# Patient Record
Sex: Male | Born: 1955
Health system: Southern US, Community
[De-identification: ages and names within clinical notes are randomized; demographics above are authoritative.]

## PROBLEM LIST (undated history)

## (undated) DIAGNOSIS — I255 Ischemic cardiomyopathy: Secondary | ICD-10-CM

## (undated) DIAGNOSIS — B20 Human immunodeficiency virus [HIV] disease: Secondary | ICD-10-CM

## (undated) DIAGNOSIS — I2510 Atherosclerotic heart disease of native coronary artery without angina pectoris: Secondary | ICD-10-CM

## (undated) DIAGNOSIS — F1910 Other psychoactive substance abuse, uncomplicated: Secondary | ICD-10-CM

## (undated) DIAGNOSIS — I472 Ventricular tachycardia: Secondary | ICD-10-CM

## (undated) DIAGNOSIS — E785 Hyperlipidemia, unspecified: Secondary | ICD-10-CM

## (undated) DIAGNOSIS — R000 Tachycardia, unspecified: Secondary | ICD-10-CM

## (undated) DIAGNOSIS — Z91199 Patient's noncompliance with other medical treatment and regimen due to unspecified reason: Secondary | ICD-10-CM

## (undated) DIAGNOSIS — Z9119 Patient's noncompliance with other medical treatment and regimen: Secondary | ICD-10-CM

## (undated) DIAGNOSIS — Z720 Tobacco use: Secondary | ICD-10-CM

## (undated) DIAGNOSIS — E079 Disorder of thyroid, unspecified: Secondary | ICD-10-CM

## (undated) DIAGNOSIS — I213 ST elevation (STEMI) myocardial infarction of unspecified site: Secondary | ICD-10-CM

## (undated) HISTORY — PX: CORONARY ANGIOPLASTY WITH STENT PLACEMENT: SHX49

## (undated) HISTORY — DX: Other psychoactive substance abuse, uncomplicated: F19.10

## (undated) HISTORY — DX: Tachycardia, unspecified: R00.0

## (undated) HISTORY — DX: Ischemic cardiomyopathy: I25.5

## (undated) HISTORY — DX: Ventricular tachycardia: I47.2

---

## 2017-09-05 ENCOUNTER — Emergency Department: Payer: No Typology Code available for payment source

## 2017-09-05 ENCOUNTER — Emergency Department
Admission: EM | Admit: 2017-09-05 | Discharge: 2017-09-06 | Disposition: A | Discharge status: Other Acute Inpatient Hospital | Payer: No Typology Code available for payment source | Attending: Emergency Medicine | Admitting: Emergency Medicine

## 2017-09-05 DIAGNOSIS — Y92410 Unspecified street and highway as the place of occurrence of the external cause: Secondary | ICD-10-CM | POA: Diagnosis not present

## 2017-09-05 DIAGNOSIS — F17200 Nicotine dependence, unspecified, uncomplicated: Secondary | ICD-10-CM | POA: Insufficient documentation

## 2017-09-05 DIAGNOSIS — S42112A Displaced fracture of body of scapula, left shoulder, initial encounter for closed fracture: Secondary | ICD-10-CM | POA: Insufficient documentation

## 2017-09-05 DIAGNOSIS — Y998 Other external cause status: Secondary | ICD-10-CM | POA: Diagnosis not present

## 2017-09-05 DIAGNOSIS — R0902 Hypoxemia: Secondary | ICD-10-CM | POA: Diagnosis not present

## 2017-09-05 DIAGNOSIS — I2510 Atherosclerotic heart disease of native coronary artery without angina pectoris: Secondary | ICD-10-CM | POA: Diagnosis not present

## 2017-09-05 DIAGNOSIS — Y939 Activity, unspecified: Secondary | ICD-10-CM | POA: Insufficient documentation

## 2017-09-05 DIAGNOSIS — S4992XA Unspecified injury of left shoulder and upper arm, initial encounter: Secondary | ICD-10-CM | POA: Diagnosis present

## 2017-09-05 DIAGNOSIS — S270XXA Traumatic pneumothorax, initial encounter: Secondary | ICD-10-CM | POA: Diagnosis not present

## 2017-09-05 DIAGNOSIS — R0602 Shortness of breath: Secondary | ICD-10-CM | POA: Insufficient documentation

## 2017-09-05 DIAGNOSIS — S27321A Contusion of lung, unilateral, initial encounter: Secondary | ICD-10-CM | POA: Diagnosis not present

## 2017-09-05 DIAGNOSIS — B20 Human immunodeficiency virus [HIV] disease: Secondary | ICD-10-CM | POA: Insufficient documentation

## 2017-09-05 DIAGNOSIS — S2242XA Multiple fractures of ribs, left side, initial encounter for closed fracture: Secondary | ICD-10-CM | POA: Diagnosis not present

## 2017-09-05 HISTORY — DX: Human immunodeficiency virus (HIV) disease: B20

## 2017-09-05 HISTORY — DX: Disorder of thyroid, unspecified: E07.9

## 2017-09-05 HISTORY — DX: Atherosclerotic heart disease of native coronary artery without angina pectoris: I25.10

## 2017-09-05 MED ORDER — ONDANSETRON HCL 4 MG/2ML IJ SOLN
4.0000 mg | Freq: Once | INTRAMUSCULAR | Status: AC
  Administered 2017-09-06: 4 mg via INTRAVENOUS
  Filled 2017-09-05: qty 2

## 2017-09-05 MED ORDER — MORPHINE SULFATE (PF) 4 MG/ML IV SOLN
4.0000 mg | Freq: Once | INTRAVENOUS | Status: AC
  Administered 2017-09-06: 4 mg via INTRAVENOUS
  Filled 2017-09-05: qty 1

## 2017-09-05 MED ORDER — SODIUM CHLORIDE 0.9 % IV BOLUS
1000.0000 mL | Freq: Once | INTRAVENOUS | Status: AC
  Administered 2017-09-06: 1000 mL via INTRAVENOUS
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
## 2017-09-06 ENCOUNTER — Encounter: Payer: Self-pay | Admitting: Radiology

## 2017-09-06 ENCOUNTER — Emergency Department: Payer: No Typology Code available for payment source

## 2017-09-06 LAB — COMPREHENSIVE METABOLIC PANEL
ALBUMIN: 4.3 g/dL (ref 3.5–5.0)
ALT: 64 U/L — ABNORMAL HIGH (ref 17–63)
AST: 75 U/L — AB (ref 15–41)
Alkaline Phosphatase: 72 U/L (ref 38–126)
Anion gap: 11 (ref 5–15)
BUN: 17 mg/dL (ref 6–20)
CHLORIDE: 107 mmol/L (ref 101–111)
CO2: 19 mmol/L — ABNORMAL LOW (ref 22–32)
Calcium: 8.8 mg/dL — ABNORMAL LOW (ref 8.9–10.3)
Creatinine, Ser: 1.13 mg/dL (ref 0.61–1.24)
GFR calc Af Amer: >60 mL/min (ref >60)
Glucose, Bld: 113 mg/dL — ABNORMAL HIGH (ref 65–99)
POTASSIUM: 4 mmol/L (ref 3.5–5.1)
SODIUM: 137 mmol/L (ref 135–145)
Total Bilirubin: 0.6 mg/dL (ref 0.3–1.2)
Total Protein: 7.9 g/dL (ref 6.5–8.1)

## 2017-09-06 LAB — CBC
HCT: 43.3 % (ref 40.0–52.0)
Hemoglobin: 14.9 g/dL (ref 13.0–18.0)
MCH: 33.3 pg (ref 26.0–34.0)
MCHC: 34.4 g/dL (ref 32.0–36.0)
MCV: 96.8 fL (ref 80.0–100.0)
Platelets: 349 10*3/uL (ref 150–440)
RBC: 4.47 MIL/uL (ref 4.40–5.90)
RDW: 13.8 % (ref 11.5–14.5)
WBC: 15.2 10*3/uL — AB (ref 3.8–10.6)

## 2017-09-06 LAB — ETHANOL: ALCOHOL ETHYL (B): 81 mg/dL — AB (ref <10)

## 2017-09-06 MED ORDER — HYDROMORPHONE HCL 1 MG/ML IJ SOLN
1.0000 mg | Freq: Once | INTRAMUSCULAR | Status: AC
  Administered 2017-09-06: 1 mg via INTRAVENOUS
  Filled 2017-09-06: qty 1

## 2017-09-06 MED ORDER — IOPAMIDOL (ISOVUE-300) INJECTION 61%
100.0000 mL | Freq: Once | INTRAVENOUS | Status: AC | PRN
  Administered 2017-09-06: 100 mL via INTRAVENOUS

## 2017-09-06 NOTE — ED Notes (Signed)
EMTALA reviewed prior to pt transport. Appears complete at this time.

## 2017-09-06 NOTE — ED Notes (Signed)
Pt 84% on room air after giving Dilaudid.  Pt placed on 4L nasal cannula to maintain saturation >90%.  EDP aware.
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
## 2017-09-06 NOTE — ED Notes (Signed)
Patient transported to CT 

## 2017-09-06 NOTE — ED Notes (Signed)
Pt provided cup of water per request, ok provided per EDP.

## 2017-09-13 ENCOUNTER — Emergency Department
Admission: EM | Admit: 2017-09-13 | Discharge: 2017-09-13 | Disposition: A | Discharge status: Home / Self Care | Payer: No Typology Code available for payment source | Attending: Emergency Medicine | Admitting: Emergency Medicine

## 2017-09-13 ENCOUNTER — Other Ambulatory Visit: Payer: Self-pay

## 2017-09-13 DIAGNOSIS — B20 Human immunodeficiency virus [HIV] disease: Secondary | ICD-10-CM | POA: Insufficient documentation

## 2017-09-13 DIAGNOSIS — S42102D Fracture of unspecified part of scapula, left shoulder, subsequent encounter for fracture with routine healing: Secondary | ICD-10-CM | POA: Diagnosis not present

## 2017-09-13 DIAGNOSIS — Z79899 Other long term (current) drug therapy: Secondary | ICD-10-CM | POA: Diagnosis not present

## 2017-09-13 DIAGNOSIS — S2242XD Multiple fractures of ribs, left side, subsequent encounter for fracture with routine healing: Secondary | ICD-10-CM | POA: Diagnosis not present

## 2017-09-13 DIAGNOSIS — Z760 Encounter for issue of repeat prescription: Secondary | ICD-10-CM | POA: Insufficient documentation

## 2017-09-13 DIAGNOSIS — F17210 Nicotine dependence, cigarettes, uncomplicated: Secondary | ICD-10-CM | POA: Insufficient documentation

## 2017-09-13 DIAGNOSIS — S2242XA Multiple fractures of ribs, left side, initial encounter for closed fracture: Secondary | ICD-10-CM

## 2017-09-13 DIAGNOSIS — I2510 Atherosclerotic heart disease of native coronary artery without angina pectoris: Secondary | ICD-10-CM | POA: Diagnosis not present

## 2017-09-13 MED ORDER — OXYCODONE HCL 5 MG PO TABS: 5.0000 mg | ORAL_TABLET | Freq: Four times a day (QID) | ORAL | 0 refills | Status: DC | PRN

## 2017-09-13 MED ORDER — GABAPENTIN 300 MG PO CAPS
300.0000 mg | ORAL_CAPSULE | Freq: Once | ORAL | Status: AC
  Administered 2017-09-13: 300 mg via ORAL
  Filled 2017-09-13 (×2): qty 1

## 2017-09-13 MED ORDER — GABAPENTIN 300 MG PO CAPS: 300.0000 mg | ORAL_CAPSULE | Freq: Three times a day (TID) | ORAL | 0 refills | Status: DC

## 2017-09-13 MED ORDER — OXYCODONE HCL 5 MG PO TABS
10.0000 mg | ORAL_TABLET | Freq: Once | ORAL | Status: AC
Start: 2017-09-13 — End: 2017-09-13
  Administered 2017-09-13: 10 mg via ORAL
  Filled 2017-09-13: qty 2

## 2017-09-13 MED ORDER — MELOXICAM 15 MG PO TABS: 15.0000 mg | ORAL_TABLET | Freq: Every day | ORAL | 0 refills | Status: DC
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
## 2018-03-18 ENCOUNTER — Emergency Department
Admission: EM | Admit: 2018-03-18 | Discharge: 2018-03-18 | Disposition: A | Discharge status: Home / Self Care | Payer: Self-pay | Attending: Emergency Medicine | Admitting: Emergency Medicine

## 2018-03-18 ENCOUNTER — Other Ambulatory Visit: Payer: Self-pay

## 2018-03-18 DIAGNOSIS — I259 Chronic ischemic heart disease, unspecified: Secondary | ICD-10-CM | POA: Insufficient documentation

## 2018-03-18 DIAGNOSIS — B20 Human immunodeficiency virus [HIV] disease: Secondary | ICD-10-CM | POA: Insufficient documentation

## 2018-03-18 DIAGNOSIS — F17210 Nicotine dependence, cigarettes, uncomplicated: Secondary | ICD-10-CM | POA: Insufficient documentation

## 2018-03-18 DIAGNOSIS — Z79899 Other long term (current) drug therapy: Secondary | ICD-10-CM | POA: Insufficient documentation

## 2018-03-18 DIAGNOSIS — N3000 Acute cystitis without hematuria: Secondary | ICD-10-CM | POA: Insufficient documentation

## 2018-03-18 LAB — CBC
HCT: 43.4 % (ref 39.0–52.0)
HEMOGLOBIN: 14.6 g/dL (ref 13.0–17.0)
MCH: 32.4 pg (ref 26.0–34.0)
MCHC: 33.6 g/dL (ref 30.0–36.0)
MCV: 96.2 fL (ref 80.0–100.0)
Platelets: 362 10*3/uL (ref 150–400)
RBC: 4.51 MIL/uL (ref 4.22–5.81)
RDW: 13.1 % (ref 11.5–15.5)
WBC: 8.2 10*3/uL (ref 4.0–10.5)
nRBC: 0 % (ref 0.0–0.2)

## 2018-03-18 LAB — URINALYSIS, COMPLETE (UACMP) WITH MICROSCOPIC
Bilirubin Urine: NEGATIVE
GLUCOSE, UA: NEGATIVE mg/dL
Hgb urine dipstick: NEGATIVE
Ketones, ur: NEGATIVE mg/dL
NITRITE: POSITIVE — AB
Protein, ur: NEGATIVE mg/dL
SPECIFIC GRAVITY, URINE: 1.019 (ref 1.005–1.030)
Squamous Epithelial / LPF: NONE SEEN (ref 0–5)
WBC, UA: >50 WBC/hpf — ABNORMAL HIGH (ref 0–5)
pH: 6 (ref 5.0–8.0)

## 2018-03-18 LAB — BASIC METABOLIC PANEL
ANION GAP: 8 (ref 5–15)
BUN: 21 mg/dL — ABNORMAL HIGH (ref 6–20)
CO2: 25 mmol/L (ref 22–32)
Calcium: 9.1 mg/dL (ref 8.9–10.3)
Chloride: 102 mmol/L (ref 98–111)
Creatinine, Ser: 0.79 mg/dL (ref 0.61–1.24)
GFR calc Af Amer: >60 mL/min (ref >60)
Glucose, Bld: 95 mg/dL (ref 70–99)
POTASSIUM: 3.9 mmol/L (ref 3.5–5.1)
SODIUM: 135 mmol/L (ref 135–145)

## 2018-03-18 MED ORDER — CEPHALEXIN 500 MG PO CAPS: 500.0000 mg | ORAL_CAPSULE | Freq: Two times a day (BID) | ORAL | 0 refills | Status: DC

## 2018-03-18 NOTE — ED Triage Notes (Signed)
Pt states hx of UTI. States urinary urgency, frequency, foul smelling urine, pain in kidneys. Denies fever. Symptoms x 3 weeks. Has increased amount of drinking to "flush kidneys out but it's not working."   A&O, ambulatory.,

## 2018-03-18 NOTE — ED Notes (Signed)
Dr. Scotty Court at bedside. Patient states he was going to leave without seeing the doctor because he has family obligations.

## 2018-03-18 NOTE — ED Notes (Signed)
Patient is in a hallway bed. NAD at this time.

## 2018-03-18 NOTE — ED Provider Notes (Signed)
[facility] Emergency Department Provider Note  ____________________________________________  Time seen: Approximately 3:44 PM  I have reviewed the triage vital signs and the nursing notes.   HISTORY  Chief Complaint Urinary Tract Infection    HPI Douglas Conway is a 62 y.o. male with a history of CAD status post stent and HIV with undetectable viral load on antiretrovirals for 30 years who complains of dysuria frequency and urgency for the past 4 weeks, persistent.  Not specifically worsening.  Denies fever chills or sweats.  Pain does radiate in the his low back.  No vomiting or nausea or dizziness.  No aggravating or alleviating factors.  Feels like a UTI he has had in the past.      Past Medical History:  Diagnosis Date  . Coronary artery disease   . HIV (human immunodeficiency virus infection) (HCC)   . Thyroid disease      There are no active problems to display for this patient.    History reviewed. No pertinent surgical history.   Prior to Admission medications   Medication Sig Start Date End Date Taking? Authorizing Provider  acyclovir (ZOVIRAX) 200 MG capsule Take 200 mg by mouth 2 (two) times daily.   Yes [provider]  bictegravir-emtricitabine-tenofovir AF (BIKTARVY) 50-200-25 MG TABS tablet Take 1 tablet by mouth daily.   Yes [provider]  carvedilol (COREG) 6.25 MG tablet Take 6.25 mg by mouth 2 (two) times daily. 01/31/14  Yes [provider]  digoxin (LANOXIN) 0.125 MG tablet Take 125 mcg by mouth daily.   Yes [provider]  levothyroxine (SYNTHROID, LEVOTHROID) 150 MCG tablet Take 150 mcg by mouth daily.   Yes [provider]  lisinopril (PRINIVIL,ZESTRIL) 10 MG tablet Take 10 mg by mouth daily. 01/31/14  Yes [provider]  pravastatin (PRAVACHOL) 40 MG tablet Take 40 mg by mouth daily. 01/31/14  Yes [provider]  ranitidine (ZANTAC) 150 MG tablet Take 150 mg by  mouth daily.   Yes [provider]  cephALEXin (KEFLEX) 500 MG capsule Take 1 capsule (500 mg total) by mouth 2 (two) times daily. 03/18/18   Sharman Cheek, MD     Allergies Plavix [clopidogrel bisulfate]   History reviewed. No pertinent family history.  Social History Social History   Tobacco Use  . Smoking status: Current Every Day Smoker  . Smokeless tobacco: Never Used  Substance Use Topics  . Alcohol use: Yes  . Drug use: Not on file    Review of Systems  Constitutional:   No fever or chills.  ENT:   No sore throat. No rhinorrhea. Cardiovascular:   No chest pain or syncope. Respiratory:   No dyspnea or cough. Gastrointestinal:   Negative for abdominal pain, vomiting and diarrhea.  Musculoskeletal:   Negative for focal pain or swelling All other systems reviewed and are negative except as documented above in ROS and HPI.  ____________________________________________   PHYSICAL EXAM:  VITAL SIGNS: ED Triage Vitals  Enc Vitals Group     BP 03/18/18 1209 (!) 120/100     Pulse Rate 03/18/18 1209 73     Resp 03/18/18 1209 18     Temp 03/18/18 1209 97.7 F (36.5 C)     Temp Source 03/18/18 1209 Oral     SpO2 03/18/18 1209 96 %     Weight 03/18/18 1210 175 lb (79.4 kg)     Height 03/18/18 1210 6\' 1"  (1.854 m)     Head Circumference --  Peak Flow --      Pain Score 03/18/18 1210 7     Pain Loc --      Pain Edu? --      Excl. in GC? --     Vital signs reviewed, nursing assessments reviewed.   Constitutional:   Alert and oriented. Non-toxic appearance. Eyes:   Conjunctivae are normal. EOMI. PERRL. ENT      Head:   Normocephalic and atraumatic.      Nose:   No congestion/rhinnorhea.        Neck:   No meningismus. Full ROM. Hematological/Lymphatic/Immunilogical:   No cervical lymphadenopathy. Cardiovascular:   RRR. Symmetric bilateral radial and DP pulses.  No murmurs. Cap refill less than 2 seconds. Respiratory:   Normal respiratory effort  without tachypnea/retractions. Breath sounds are clear and equal bilaterally. No wheezes/rales/rhonchi. Gastrointestinal:   Soft with mild suprapubic tenderness. Non distended. There is no CVA tenderness.  No rebound, rigidity, or guarding. Musculoskeletal:   Normal range of motion in all extremities. No joint effusions.  No lower extremity tenderness.  No edema. Neurologic:   Normal speech and language.  Motor grossly intact. No acute focal neurologic deficits are appreciated.    ____________________________________________    LABS (pertinent positives/negatives) (all labs ordered are listed, but only abnormal results are displayed) Labs Reviewed  URINALYSIS, COMPLETE (UACMP) WITH MICROSCOPIC - Abnormal; Notable for the following components:      Result Value   Color, Urine YELLOW (*)    APPearance HAZY (*)    Nitrite POSITIVE (*)    Leukocytes, UA SMALL (*)    WBC, UA >50 (*)    Bacteria, UA MANY (*)    All other components within normal limits  BASIC METABOLIC PANEL - Abnormal; Notable for the following components:   BUN 21 (*)    All other components within normal limits  URINE CULTURE  CBC   ____________________________________________   EKG    ____________________________________________    RADIOLOGY  No results found.  ____________________________________________   PROCEDURES Procedures  ____________________________________________    CLINICAL IMPRESSION / ASSESSMENT AND PLAN / ED COURSE  Pertinent labs & imaging results that were available during my care of the patient were reviewed by me and considered in my medical decision making (see chart for details).    Patient presents with cystitis.  Clinically does not have pyelonephritis.  Not septic.  Labs do not show any evidence of immunosuppression.  Urine culture sent, follow-up with primary care in a week.  Patient needs to leave the ED immediately due to family obligations, but he has no history of  kidney stones or urinary retention or bladder outlet obstruction or large prostate to suggest a complicated course.      ____________________________________________   FINAL CLINICAL IMPRESSION(S) / ED DIAGNOSES    Final diagnoses:  Acute cystitis without hematuria     ED Discharge Orders         Ordered    cephALEXin (KEFLEX) 500 MG capsule  2 times daily     03/18/18 1543          Portions of this note were generated with dragon dictation software. Dictation errors may occur despite best attempts at proofreading.    Sharman Cheek, MD 03/18/18 [phone]

## 2018-03-18 NOTE — ED Notes (Signed)
Patient refused discharge vital signs. E-signature pad on WOW did not work and patient was ready to go.

## 2018-03-20 LAB — URINE CULTURE: Culture: >=100000 — AB

## 2018-06-13 ENCOUNTER — Other Ambulatory Visit (HOSPITAL_COMMUNITY)
Admission: RE | Admit: 2018-06-13 | Discharge: 2018-06-13 | Disposition: A | Discharge status: Home / Self Care | Source: Ambulatory Visit | Attending: Family | Admitting: Family

## 2018-06-13 ENCOUNTER — Other Ambulatory Visit

## 2018-06-13 ENCOUNTER — Other Ambulatory Visit: Payer: Self-pay | Admitting: *Deleted

## 2018-06-13 DIAGNOSIS — Z79899 Other long term (current) drug therapy: Secondary | ICD-10-CM

## 2018-06-13 DIAGNOSIS — B20 Human immunodeficiency virus [HIV] disease: Secondary | ICD-10-CM

## 2018-06-13 DIAGNOSIS — Z113 Encounter for screening for infections with a predominantly sexual mode of transmission: Secondary | ICD-10-CM

## 2018-06-13 NOTE — Addendum Note (Signed)
Addended by: Mariea Clonts D on: 06/13/2018 02:36 PM   Modules accepted: Orders

## 2018-06-14 LAB — T-HELPER CELL (CD4) - (RCID CLINIC ONLY)
CD4 % Helper T Cell: 29 % — ABNORMAL LOW (ref 33–55)
CD4 T Cell Abs: 800 /uL (ref 400–2700)

## 2018-06-14 LAB — URINE CYTOLOGY ANCILLARY ONLY
Chlamydia: NEGATIVE
Neisseria Gonorrhea: NEGATIVE

## 2018-06-21 LAB — HEPATITIS A ANTIBODY, TOTAL: Hepatitis A AB,Total: REACTIVE — AB

## 2018-06-21 LAB — CBC WITH DIFFERENTIAL/PLATELET
Absolute Monocytes: 440 cells/uL (ref 200–950)
Basophils Absolute: 81 cells/uL (ref 0–200)
Basophils Relative: 1.3 %
EOS PCT: 2.6 %
Eosinophils Absolute: 161 cells/uL (ref 15–500)
HCT: 42.8 % (ref 38.5–50.0)
Hemoglobin: 15.4 g/dL (ref 13.2–17.1)
Lymphs Abs: 2722 cells/uL (ref 850–3900)
MCH: 33.7 pg — ABNORMAL HIGH (ref 27.0–33.0)
MCHC: 36 g/dL (ref 32.0–36.0)
MCV: 93.7 fL (ref 80.0–100.0)
MPV: 10.9 fL (ref 7.5–12.5)
Monocytes Relative: 7.1 %
Neutro Abs: 2796 cells/uL (ref 1500–7800)
Neutrophils Relative %: 45.1 %
Platelets: 207 10*3/uL (ref 140–400)
RBC: 4.57 10*6/uL (ref 4.20–5.80)
RDW: 12.8 % (ref 11.0–15.0)
Total Lymphocyte: 43.9 %
WBC: 6.2 10*3/uL (ref 3.8–10.8)

## 2018-06-21 LAB — URINALYSIS
Bilirubin Urine: NEGATIVE
Glucose, UA: NEGATIVE
HGB URINE DIPSTICK: NEGATIVE
Ketones, ur: NEGATIVE
Leukocytes,Ua: NEGATIVE
Nitrite: NEGATIVE
Protein, ur: NEGATIVE
Specific Gravity, Urine: 1.021 (ref 1.001–1.03)
pH: 6.5 (ref 5.0–8.0)

## 2018-06-21 LAB — COMPLETE METABOLIC PANEL WITH GFR
AG Ratio: 1.6 (calc) (ref 1.0–2.5)
ALT: 24 U/L (ref 9–46)
AST: 29 U/L (ref 10–35)
Albumin: 4.6 g/dL (ref 3.6–5.1)
Alkaline phosphatase (APISO): 68 U/L (ref 35–144)
BUN: 18 mg/dL (ref 7–25)
CO2: 31 mmol/L (ref 20–32)
Calcium: 9.4 mg/dL (ref 8.6–10.3)
Chloride: 103 mmol/L (ref 98–110)
Creat: 1.02 mg/dL (ref 0.70–1.25)
GFR, Est African American: 92 mL/min/{1.73_m2} (ref >=60)
GFR, Est Non African American: 80 mL/min/{1.73_m2} (ref >=60)
Globulin: 2.8 g/dL (calc) (ref 1.9–3.7)
Glucose, Bld: 110 mg/dL — ABNORMAL HIGH (ref 65–99)
Potassium: 4.3 mmol/L (ref 3.5–5.3)
Sodium: 142 mmol/L (ref 135–146)
Total Bilirubin: 0.5 mg/dL (ref 0.2–1.2)
Total Protein: 7.4 g/dL (ref 6.1–8.1)

## 2018-06-21 LAB — HEPATITIS B SURFACE ANTIGEN: Hepatitis B Surface Ag: NONREACTIVE

## 2018-06-21 LAB — LIPID PANEL
Cholesterol: 225 mg/dL — ABNORMAL HIGH (ref <200)
HDL: 30 mg/dL — ABNORMAL LOW (ref >=40)
Non-HDL Cholesterol (Calc): 195 mg/dL (calc) — ABNORMAL HIGH (ref <130)
Total CHOL/HDL Ratio: 7.5 (calc) — ABNORMAL HIGH (ref <5.0)
Triglycerides: 452 mg/dL — ABNORMAL HIGH (ref <150)

## 2018-06-21 LAB — HCV RNA,QUANTITATIVE REAL TIME PCR
HCV Quantitative Log: <1.18 Log IU/mL
HCV RNA, PCR, QN: <15 IU/mL

## 2018-06-21 LAB — HEPATITIS B SURFACE ANTIBODY,QUALITATIVE: Hep B S Ab: REACTIVE — AB

## 2018-06-21 LAB — HIV ANTIBODY (ROUTINE TESTING W REFLEX): HIV 1&2 Ab, 4th Generation: REACTIVE — AB

## 2018-06-21 LAB — HEPATITIS B CORE ANTIBODY, TOTAL: HEP B C TOTAL AB: NONREACTIVE

## 2018-06-21 LAB — HIV-1 RNA ULTRAQUANT REFLEX TO GENTYP+
HIV 1 RNA Quant: 31500 copies/mL — ABNORMAL HIGH
HIV-1 RNA Quant, Log: 4.5 Log copies/mL — ABNORMAL HIGH

## 2018-06-21 LAB — HLA B*5701: HLA-B*5701 w/rflx HLA-B High: NEGATIVE

## 2018-06-21 LAB — HEPATITIS C ANTIBODY
Hepatitis C Ab: REACTIVE — AB
SIGNAL TO CUT-OFF: 6.9 — ABNORMAL HIGH (ref <1.00)

## 2018-06-21 LAB — QUANTIFERON-TB GOLD PLUS
Mitogen-NIL: 7.39 IU/mL
NIL: 0.04 IU/mL
QuantiFERON-TB Gold Plus: NEGATIVE
TB1-NIL: 0 IU/mL
TB2-NIL: 0 [IU]/mL

## 2018-06-21 LAB — HIV-1 GENOTYPE: HIV-1 GENOTYPE: DETECTED — AB

## 2018-06-21 LAB — RPR: RPR Ser Ql: NONREACTIVE

## 2018-06-21 LAB — HIV-1/2 AB - DIFFERENTIATION
HIV 2 AB: NEGATIVE
HIV-1 antibody: POSITIVE — AB

## 2018-06-28 ENCOUNTER — Other Ambulatory Visit: Payer: Self-pay

## 2018-06-28 ENCOUNTER — Ambulatory Visit: Payer: Self-pay | Admitting: Pharmacist

## 2018-06-28 ENCOUNTER — Encounter: Payer: Self-pay | Admitting: Family

## 2018-06-28 ENCOUNTER — Ambulatory Visit (INDEPENDENT_AMBULATORY_CARE_PROVIDER_SITE_OTHER): Admitting: Family

## 2018-06-28 VITALS — BP 124/86 | HR 71 | Temp 98.2°F

## 2018-06-28 DIAGNOSIS — B20 Human immunodeficiency virus [HIV] disease: Secondary | ICD-10-CM

## 2018-06-28 DIAGNOSIS — H9193 Unspecified hearing loss, bilateral: Secondary | ICD-10-CM

## 2018-06-28 DIAGNOSIS — Z0000 Encounter for general adult medical examination without abnormal findings: Secondary | ICD-10-CM | POA: Insufficient documentation

## 2018-06-28 DIAGNOSIS — I10 Essential (primary) hypertension: Secondary | ICD-10-CM

## 2018-06-28 HISTORY — DX: Human immunodeficiency virus (HIV) disease: B20

## 2018-06-28 HISTORY — DX: Unspecified hearing loss, bilateral: H91.93

## 2018-06-28 HISTORY — DX: Essential (primary) hypertension: I10

## 2018-06-28 NOTE — Assessment & Plan Note (Signed)
Blood pressure appears adequately controlled with current regimen of lisinopril and carvedilol with no adverse side effects.  Encouraged to continue to monitor blood pressure at home and continue current dose of lisinopril and carvedilol.  No signs/symptoms of endorgan damage or headaches at present.  Continue to monitor

## 2018-06-28 NOTE — Assessment & Plan Note (Signed)
Acute onset change in hearing bilaterally over the last 3 months with no trauma or injury that he can recall.  Recommend follow-up with ENT for further evaluation and hearing exam as there were no significant findings on exam today.

## 2018-06-28 NOTE — Assessment & Plan Note (Signed)
Mr. Talford has generally well controlled HIV disease and unfortunately was off of his medication for 1 month leading to a viral load of 31,800 with a CD4 count of 800.  He has since restarted medication with good adherence and tolerance.  Genotype was without resistance and wild.  No signs/symptoms of opportunistic infection or progressive HIV disease at present.  He is currently incarcerated with plans to return to [place].  Continue current dose of Biktarvy.  We will recheck blood work in 1 month to ensure viral load suppression and follow-up office visit in 3 months or sooner if needed with blood work 1 to 2 weeks prior to appointment.

## 2018-06-28 NOTE — Assessment & Plan Note (Signed)
   Declines immunizations today.  Will obtain previous immunization records from previous provider in [place].  Discussed importance of safe sexual practice to reduce risk of acquisition and transmission of STI.  Declines condoms.  Due for a dental and colon cancer screening.  If staying around area will refer to [facility] dental clinic

## 2018-06-28 NOTE — Progress Notes (Signed)
Subjective:    Patient ID: Douglas Conway, male    DOB: [birth year], 63 y.o.   MRN: [id number]  Chief Complaint  Patient presents with  . HIV Positive/AIDS    HPI:  Douglas Conway is a 63 y.o. male with previous medical history of thyroid disease, HIV, and coronary artery disease who presents today for initial office visit to transfer/establish care for HIV disease.   [name] completed initial clinic blood work on 06/13/2018 with a viral load of 31,500 and CD4 count of 800.  Genotype able to be completed showing subtype B with no mutations/resistance present indicating wild-type virus. [id number] and Quantiferon GOLD negative.  Hepatitis C antibody positive with viral load being undetectable indicating previous infection of hepatitis C or treatment with clearance of the virus.  He is immune serologically to hepatitis B and hepatitis A. kidney function, liver function, electrolytes within normal ranges.  Lipid profile with LDL pending, HDL 30, and triglycerides of 452.  Mr. Adebayo has been taking his Biktarvy as prescribed with no adverse side effects or missed doses recently, however when first incarcerated missed approximately 1 month of medication. Previous care provider was in [place] and has intentions of returning once released.  Overall feels well today, however been having difficulty with changes in hearing that of been going on over the past 3 months starting in the left ear and progressing to the right ear.  He is awaiting hearing evaluation by ENT. Denies fevers, chills, night sweats, headaches, changes in vision, neck pain/stiffness, nausea, diarrhea, vomiting, lesions or rashes.  [name] currently receives his medication from the [facility] without complication. He is not currently sexually active. Denies feelings of being down, depressed or hopeless. He is due for colonoscopy and dental screenings. Denies recreational or illicit drug use. Recently has stopped his  tobacco usage.    Allergies  Allergen Reactions  . Plavix [Clopidogrel Bisulfate] Rash      Outpatient Medications Prior to Visit  Medication Sig Dispense Refill  . acyclovir (ZOVIRAX) 200 MG capsule Take 200 mg by mouth 2 (two) times daily.    . bictegravir-emtricitabine-tenofovir AF (BIKTARVY) 50-200-25 MG TABS tablet Take 1 tablet by mouth daily.    . carvedilol (COREG) 6.25 MG tablet Take 6.25 mg by mouth 2 (two) times daily.    . digoxin (LANOXIN) 0.125 MG tablet Take 125 mcg by mouth daily.    Marland Kitchen levothyroxine (SYNTHROID, LEVOTHROID) 150 MCG tablet Take 150 mcg by mouth daily.    Marland Kitchen lisinopril (PRINIVIL,ZESTRIL) 10 MG tablet Take 10 mg by mouth daily.    . pravastatin (PRAVACHOL) 40 MG tablet Take 40 mg by mouth daily.    . cephALEXin (KEFLEX) 500 MG capsule Take 1 capsule (500 mg total) by mouth 2 (two) times daily. 14 capsule 0  . ranitidine (ZANTAC) 150 MG tablet Take 150 mg by mouth daily.     No facility-administered medications prior to visit.      Past Medical History:  Diagnosis Date  . Coronary artery disease   . HIV (human immunodeficiency virus infection) ([place])   . Thyroid disease       History reviewed. No pertinent surgical history.    History reviewed. No pertinent family history.    Social History   Socioeconomic History  . Marital status: Divorced    Spouse name: Not on file  . Number of children: Not on file  . Years of education: Not on file  . Highest education level: Not on  file  Occupational History  . Not on file  Social Needs  . Financial resource strain: Not on file  . Food insecurity:    Worry: Not on file    Inability: Not on file  . Transportation needs:    Medical: Not on file    Non-medical: Not on file  Tobacco Use  . Smoking status: Former Research scientist (life sciences)  . Smokeless tobacco: Never Used  Substance and Sexual Activity  . Alcohol use: Not Currently    Alcohol/week: 12.0 standard drinks    Types: 12 Cans of beer per week  .  Drug use: Never  . Sexual activity: Not on file  Lifestyle  . Physical activity:    Days per week: Not on file    Minutes per session: Not on file  . Stress: Not on file  Relationships  . Social connections:    Talks on phone: Not on file    Gets together: Not on file    Attends religious service: Not on file    Active member of club or organization: Not on file    Attends meetings of clubs or organizations: Not on file    Relationship status: Not on file  . Intimate partner violence:    Fear of current or ex partner: Not on file    Emotionally abused: Not on file    Physically abused: Not on file    Forced sexual activity: Not on file  Other Topics Concern  . Not on file  Social History Narrative  . Not on file      Review of Systems  Constitutional: Negative for appetite change, chills, fatigue, fever and unexpected weight change.  HENT: Positive for hearing loss.   Eyes: Negative for visual disturbance.  Respiratory: Negative for cough, chest tightness, shortness of breath and wheezing.   Cardiovascular: Negative for chest pain and leg swelling.  Gastrointestinal: Negative for abdominal pain, constipation, diarrhea, nausea and vomiting.  Genitourinary: Negative for dysuria, flank pain, frequency, genital sores, hematuria and urgency.  Skin: Negative for rash.  Allergic/Immunologic: Negative for immunocompromised state.  Neurological: Negative for dizziness and headaches.       Objective:    BP 124/86   Pulse 71   Temp 98.2 F (36.8 C)  Nursing note and vital signs reviewed.  Physical Exam Constitutional:      General: He is not in acute distress.    Appearance: He is well-developed.  HENT:     Ears:     Comments: Mild amount of cerumen in both ears. There is no evidence of infection present. Some fluid located behind both ears.     Mouth/Throat:     Dentition: Abnormal dentition.  Eyes:     Conjunctiva/sclera: Conjunctivae normal.  Neck:      Musculoskeletal: Neck supple.  Cardiovascular:     Rate and Rhythm: Normal rate and regular rhythm.     Heart sounds: Normal heart sounds. No murmur. No friction rub. No gallop.   Pulmonary:     Effort: Pulmonary effort is normal. No respiratory distress.     Breath sounds: Normal breath sounds. No wheezing or rales.  Chest:     Chest wall: No tenderness.  Abdominal:     General: Bowel sounds are normal. There is no distension.     Palpations: Abdomen is soft.     Tenderness: There is no abdominal tenderness. There is no rebound.  Lymphadenopathy:     Cervical: No cervical adenopathy.  Skin:  General: Skin is warm and dry.     Findings: No rash.  Neurological:     Mental Status: He is alert.  Psychiatric:        Mood and Affect: Mood normal.         Assessment & Plan:   Problem List Items Addressed This Visit      Cardiovascular and Mediastinum   Hypertension    Blood pressure appears adequately controlled with current regimen of lisinopril and carvedilol with no adverse side effects.  Encouraged to continue to monitor blood pressure at home and continue current dose of lisinopril and carvedilol.  No signs/symptoms of endorgan damage or headaches at present.  Continue to monitor        Other   HIV disease [facility]) - Primary    Mr. Kempfer has generally well controlled HIV disease and unfortunately was off of his medication for 1 month leading to a viral load of 31,800 with a CD4 count of 800.  He has since restarted medication with good adherence and tolerance.  Genotype was without resistance and wild.  No signs/symptoms of opportunistic infection or progressive HIV disease at present.  He is currently incarcerated with plans to return to [place].  Continue current dose of Biktarvy.  We will recheck blood work in 1 month to ensure viral load suppression and follow-up office visit in 3 months or sooner if needed with blood work 1 to 2 weeks prior to appointment.      Change  in hearing, bilateral    Acute onset change in hearing bilaterally over the last 3 months with no trauma or injury that he can recall.  Recommend follow-up with ENT for further evaluation and hearing exam as there were no significant findings on exam today.      Healthcare maintenance     Declines immunizations today.  Will obtain previous immunization records from previous provider in [place].  Discussed importance of safe sexual practice to reduce risk of acquisition and transmission of STI.  Declines condoms.  Due for a dental and colon cancer screening.  If staying around area will refer to [place] clinic          I have discontinued Salih Zajicek's ranitidine and cephALEXin. I am also having him maintain his acyclovir, bictegravir-emtricitabine-tenofovir AF, carvedilol, digoxin, levothyroxine, lisinopril, and pravastatin.   Follow-up: Return in about 3 months (around 09/28/2018), or if symptoms worsen or fail to improve.    Terri Piedra, MSN, FNP-C Nurse Practitioner [facility] for Infectious Disease [place] [facility] Office phone: [phone] Pager: [place] number: [phone]

## 2018-06-28 NOTE — Patient Instructions (Addendum)
Nice to meet you.  Please continue to take your Fromberg as prescribed.  We will recheck your blood work in 1 month to ensure viral suppression.  We will obtain your records from previous provider.  Plan for follow up in 3 months or sooner if needed or return to [place].

## 2018-07-13 ENCOUNTER — Encounter: Payer: Self-pay | Admitting: Family

## 2018-11-13 ENCOUNTER — Ambulatory Visit: Admitting: Family

## 2018-11-19 ENCOUNTER — Encounter: Payer: Self-pay | Admitting: Emergency Medicine

## 2018-11-19 ENCOUNTER — Other Ambulatory Visit: Payer: Self-pay

## 2018-11-19 ENCOUNTER — Emergency Department
Admission: EM | Admit: 2018-11-19 | Discharge: 2018-11-19 | Disposition: A | Discharge status: Home / Self Care | Attending: Emergency Medicine | Admitting: Emergency Medicine

## 2018-11-19 DIAGNOSIS — Z79899 Other long term (current) drug therapy: Secondary | ICD-10-CM | POA: Insufficient documentation

## 2018-11-19 DIAGNOSIS — F17210 Nicotine dependence, cigarettes, uncomplicated: Secondary | ICD-10-CM | POA: Insufficient documentation

## 2018-11-19 DIAGNOSIS — I10 Essential (primary) hypertension: Secondary | ICD-10-CM | POA: Insufficient documentation

## 2018-11-19 DIAGNOSIS — B20 Human immunodeficiency virus [HIV] disease: Secondary | ICD-10-CM | POA: Insufficient documentation

## 2018-11-19 DIAGNOSIS — L02411 Cutaneous abscess of right axilla: Secondary | ICD-10-CM

## 2018-11-19 DIAGNOSIS — L02412 Cutaneous abscess of left axilla: Secondary | ICD-10-CM | POA: Insufficient documentation

## 2018-11-19 MED ORDER — SULFAMETHOXAZOLE-TRIMETHOPRIM 800-160 MG PO TABS
1.0000 | ORAL_TABLET | Freq: Once | ORAL | Status: AC
  Administered 2018-11-19: 1 via ORAL
  Filled 2018-11-19: qty 1

## 2018-11-19 MED ORDER — LIDOCAINE HCL (PF) 1 % IJ SOLN
5.0000 mL | Freq: Once | INTRAMUSCULAR | Status: AC
  Administered 2018-11-19: 5 mL
  Filled 2018-11-19: qty 5

## 2018-11-19 MED ORDER — SULFAMETHOXAZOLE-TRIMETHOPRIM 800-160 MG PO TABS: 1.0000 | ORAL_TABLET | Freq: Two times a day (BID) | ORAL | 0 refills | Status: DC

## 2018-11-19 NOTE — Discharge Instructions (Signed)
You have had several small abscesses under the armpit opened and drained today. Keep the wounds clean, dry, and covered. You may remove the packing in 3 days. Apply warm compresses over the dressing to promote healing. Take the antibiotic as directed, until all pills are gone. Follow-up with [facility] or return to the ED as needed.

## 2018-11-19 NOTE — ED Notes (Signed)
Pt states he does not need pain meds

## 2018-11-19 NOTE — ED Triage Notes (Signed)
Has bumps in arm pit area.

## 2018-11-19 NOTE — ED Provider Notes (Signed)
[facility] Emergency Department Provider Note ____________________________________________  Time seen: 1411  I have reviewed the triage vital signs and the nursing notes.  HISTORY  Chief Complaint  Abscess  HPI Ajon Borsuk is a 63 y.o. male presents himself to the ED for evaluation of multiple tender lesions to the left axilla. He denies previous episodes of the same. He is without fevers, chills, or nausea. He denies spontaneous drainage and has not manipulated the lesions.   Past Medical History:  Diagnosis Date  . Coronary artery disease   . HIV (human immunodeficiency virus infection) (H[facility])   . Thyroid disease     Patient Active Problem List   Diagnosis Date Noted  . HIV disease (HCC) 06/28/2018  . 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 Hypertension 06/28/2018  . Healthcare maintenance 06/28/2018    Past Surgical History:  Procedure Laterality Date  . CORONARY ANGIOPLASTY WITH STENT PLACEMENT      Prior to Admission medications   Medication Sig Start Date End Date Taking? Authorizing Provider  acyclovir (ZOVIRAX) 200 MG capsule Take 200 mg by mouth 2 (two) times daily.    [provider]  bictegravir-emtricitabine-tenofovir AF (BIKTARVY) 50-200-25 MG TABS tablet Take 1 tablet by mouth daily.    [provider]  carvedilol (COREG) 6.25 MG tablet Take 6.25 mg by mouth 2 (two) times daily. 01/31/14   [provider]  digoxin (LANOXIN) 0.125 MG tablet Take 125 mcg by mouth daily.    [provider]  levothyroxine (SYNTHROID, LEVOTHROID) 150 MCG tablet Take 150 mcg by mouth daily.    [provider]  lisinopril (PRINIVIL,ZESTRIL) 10 MG tablet Take 10 mg by mouth daily. 01/31/14   [provider]  pravastatin (PRAVACHOL) 40 MG tablet Take 40 mg by mouth daily. 01/31/14   [provider]  sulfamethoxazole-trimethoprim (BACTRIM DS) 800-160 MG tablet Take 1 tablet by mouth 2 (two) times  daily. 11/19/18   Shalia Bartko, Charlesetta Ivory, PA-C    Allergies Plavix [clopidogrel bisulfate]  No family history on file.  Social History Social History   Tobacco Use  . Smoking status: Current Some Day Smoker  . Smokeless tobacco: Never Used  Substance Use Topics  . Alcohol use: Yes    Alcohol/week: 12.0 standard drinks    Types: 12 Cans of beer per week  . Drug use: Never    Review of Systems  Constitutional: Negative for fever. Eyes: Negative for visual changes. ENT: Negative for sore throat. Cardiovascular: Negative for chest pain. Respiratory: Negative for shortness of breath. Gastrointestinal: Negative for abdominal pain, vomiting and diarrhea. Genitourinary: Negative for dysuria. Musculoskeletal: Negative for back pain. Skin: Negative for rash. Left axilla abscess as above Neurological: Negative for headaches, focal weakness or numbness. ____________________________________________  PHYSICAL EXAM:  VITAL SIGNS: ED Triage Vitals [11/19/18 1401]  Enc Vitals Group     BP 111/70     Pulse Rate 63     Resp 18     Temp 98.2 F (36.8 C)     Temp Source Oral     SpO2 97 %     Weight      Height      Head Circumference      Peak Flow      Pain Score      Pain Loc      Pain Edu?      Excl. in GC?     Constitutional: Alert and oriented. Well appearing and in no distress. Head: Normocephalic  and atraumatic. Eyes: Conjunctivae are normal. Normal extraocular movements Neck: Supple. No thyromegaly. Cardiovascular: Normal rate, regular rhythm. Normal distal pulses. Respiratory: Normal respiratory effort. No wheezes/rales/rhonchi. Musculoskeletal: Nontender with normal range of motion in all extremities.  Neurologic:  Normal gait without ataxia. Normal speech and language. No gross focal neurologic deficits are appreciated. Skin:  Skin is warm, dry and intact. No rash noted. Left axilla with 4 multiple, raised, erythematous papular lesions consistent with focal  abscesses.  ____________________________________________  PROCEDURES  Bactrim DS 1 PO  .Marland KitchenIncision and Drainage  Date/Time: 11/19/2018 2:43 PM Performed by: Melvenia Needles, PA-C Authorized by: Melvenia Needles, PA-C   Consent:    Consent obtained:  Verbal   Consent given by:  Patient   Risks discussed:  Pain and incomplete drainage   Alternatives discussed:  Alternative treatment Location:    Type:  Abscess   Location:  Upper extremity   Upper extremity location: L axilla. Pre-procedure details:    Skin preparation:  Betadine Procedure type:    Complexity:  Simple Procedure details:    Incision types:  Stab incision   Incision depth:  Subcutaneous   Scalpel blade:  11   Wound management:  Probed and deloculated   Drainage:  Purulent   Drainage amount:  Moderate   Packing materials:  1/4 in iodoform gauze   Amount 1/4" iodoform:  3" Post-procedure details:    Patient tolerance of procedure:  Tolerated well, no immediate complications   ____________________________________________  INITIAL IMPRESSION / ASSESSMENT AND PLAN / ED COURSE  Magdaleno Lortie was evaluated in Emergency Department on 11/19/2018 for the symptoms described in the history of present illness. He was evaluated in the context of the global COVID-19 pandemic, which necessitated consideration that the patient might be at risk for infection with the SARS-CoV-2 virus that causes COVID-19. Institutional protocols and algorithms that pertain to the evaluation of patients at risk for COVID-19 are in a state of rapid change based on information released by regulatory bodies including the CDC and federal and state organizations. These policies and algorithms were followed during the patient's care in the ED.  Patient with ED evaluation multiple tender nodules to the left axilla.  Patient presents with what appears to be focal subcutaneous abscesses.  They are prepped and drained with meaningful expression  of purulent material.  They are packed and dressed as appropriate.  Patient is discharged with prescriptions for Bactrim to take as directed.  He will follow-up with this ED in 3 days for wound check and packing removal.  He is discharged with wound care supplies.  Return precautions have been reviewed. ____________________________________________  FINAL CLINICAL IMPRESSION(S) / ED DIAGNOSES  Final diagnoses:  Abscess of axilla, right      Melvenia Needles, PA-C 11/19/18 1846    Lavonia Drafts, MD 11/20/18 1319

## 2018-11-19 NOTE — ED Notes (Signed)
See triage note  Presents with raised areas under right arm  States he noticed area about 1 week ago   Has 4 areas this am

## 2018-12-10 ENCOUNTER — Telehealth: Payer: Self-pay | Admitting: Pharmacy Technician

## 2018-12-10 DIAGNOSIS — E038 Other specified hypothyroidism: Secondary | ICD-10-CM

## 2018-12-10 NOTE — Telephone Encounter (Signed)
Patient called in this morning requesting a refill of Levothyroxine which was originally prescribed in [place] prior to his being incarcerated. He states his first month in, he was not given any medication which caused his TSH to elevate and dose go from 75 mcg to 150 mcg. Medication was provided during his time in the detention center. He was released in July and provided 30 days of medication at that time. He currently has 3 tablets remaining and requested a new script be sent to [facility] on [address]. Labs were sent in from the [place] detention center from 06/27/17 and will need more recent labs before refill can be approved. Patient states that [facility] has more recent labwork.   Due to transportation concerns, patient is unable to get to the office until his September appointment and asking that he get a one time fill to last until that appointment time. He is concerned going without for that long will mess up his levels like his first time being incarcerated.

## 2018-12-12 MED ORDER — LEVOTHYROXINE SODIUM 150 MCG PO TABS: 150.0000 ug | ORAL_TABLET | Freq: Every day | ORAL | 0 refills | Status: DC

## 2018-12-12 NOTE — Addendum Note (Signed)
Addended by: Mauricio Po D on: 12/12/2018 01:13 PM   Modules accepted: Orders

## 2018-12-12 NOTE — Telephone Encounter (Signed)
Received request for refill of levothyroxine. Most recent lab showed a TSH of 0.8 following change of medication. Will re-check TSH at next blood work in 3 weeks and have sent prescription to pharmacy.

## 2019-01-07 ENCOUNTER — Ambulatory Visit: Payer: Self-pay

## 2019-01-07 ENCOUNTER — Encounter: Payer: Self-pay | Admitting: Family

## 2019-01-07 ENCOUNTER — Other Ambulatory Visit: Payer: Self-pay

## 2019-01-07 ENCOUNTER — Telehealth: Payer: Self-pay | Admitting: Pharmacy Technician

## 2019-01-07 ENCOUNTER — Ambulatory Visit (INDEPENDENT_AMBULATORY_CARE_PROVIDER_SITE_OTHER): Payer: Self-pay | Admitting: Family

## 2019-01-07 VITALS — BP 119/74 | HR 69 | Temp 97.9°F

## 2019-01-07 DIAGNOSIS — E038 Other specified hypothyroidism: Secondary | ICD-10-CM

## 2019-01-07 DIAGNOSIS — M7710 Lateral epicondylitis, unspecified elbow: Secondary | ICD-10-CM

## 2019-01-07 DIAGNOSIS — Z79899 Other long term (current) drug therapy: Secondary | ICD-10-CM

## 2019-01-07 DIAGNOSIS — Z113 Encounter for screening for infections with a predominantly sexual mode of transmission: Secondary | ICD-10-CM

## 2019-01-07 DIAGNOSIS — I10 Essential (primary) hypertension: Secondary | ICD-10-CM

## 2019-01-07 DIAGNOSIS — E039 Hypothyroidism, unspecified: Secondary | ICD-10-CM

## 2019-01-07 DIAGNOSIS — B20 Human immunodeficiency virus [HIV] disease: Secondary | ICD-10-CM

## 2019-01-07 DIAGNOSIS — Z23 Encounter for immunization: Secondary | ICD-10-CM

## 2019-01-07 DIAGNOSIS — Z0000 Encounter for general adult medical examination without abnormal findings: Secondary | ICD-10-CM

## 2019-01-07 DIAGNOSIS — M7712 Lateral epicondylitis, left elbow: Secondary | ICD-10-CM

## 2019-01-07 DIAGNOSIS — Z8619 Personal history of other infectious and parasitic diseases: Secondary | ICD-10-CM

## 2019-01-07 HISTORY — DX: Lateral epicondylitis, unspecified elbow: M77.10

## 2019-01-07 HISTORY — DX: Other specified hypothyroidism: E03.8

## 2019-01-07 HISTORY — DX: Hypothyroidism, unspecified: E03.9

## 2019-01-07 MED ORDER — PRAVASTATIN SODIUM 40 MG PO TABS: 40.0000 mg | ORAL_TABLET | Freq: Every day | ORAL | 0 refills | Status: DC

## 2019-01-07 MED ORDER — LEVOTHYROXINE SODIUM 150 MCG PO TABS: 150.0000 ug | ORAL_TABLET | Freq: Every day | ORAL | 0 refills | Status: DC

## 2019-01-07 MED ORDER — LISINOPRIL 10 MG PO TABS: 10.0000 mg | ORAL_TABLET | Freq: Every day | ORAL | 0 refills | Status: DC

## 2019-01-07 MED ORDER — CARVEDILOL 6.25 MG PO TABS: 6.2500 mg | ORAL_TABLET | Freq: Two times a day (BID) | ORAL | 0 refills | Status: DC

## 2019-01-07 MED ORDER — VALACYCLOVIR HCL 500 MG PO TABS: 500.0000 mg | ORAL_TABLET | Freq: Two times a day (BID) | ORAL | 1 refills | Status: DC

## 2019-01-07 MED ORDER — DIGOXIN 125 MCG PO TABS: 125.0000 ug | ORAL_TABLET | Freq: Every day | ORAL | 0 refills | Status: DC

## 2019-01-07 NOTE — Assessment & Plan Note (Signed)
   Influenza and Prevnar updated today.  Discussed importance of safe sexual practice to reduce risk of acquisition/transmission of STI.  Condoms provided.

## 2019-01-07 NOTE — Assessment & Plan Note (Addendum)
Douglas Conway appears to have adequately controlled HIV disease despite less than optimal adherence to follow-up office visits with good adherence and tolerance to his ART regimen of Biktarvy.  Discussed importance of routine follow-up and taking medication as prescribed.  He has no problems obtaining medication from the pharmacy and has no signs/symptoms of opportunistic infection or progressive HIV disease.  Continue current dose of Biktarvy.  Check blood work today.  Plan for follow-up in 1 months or sooner if needed.

## 2019-01-07 NOTE — Progress Notes (Signed)
Subjective:    Patient ID: Douglas Conway, male    DOB: [birth year], 63 y.o.   MRN: [id number]  Chief Complaint  Patient presents with  . Follow-up    needs labs today     HPI:  Douglas Conway is a 63 y.o. male with HIV disease who was last seen in the office on 06/28/2018 and was off his medication for 1 month leading to a viral load of 31,800 and CD4 count of 800.  He was continued on his Biktarvy at that time as he was incarcerated in [place].  Unfortunately he has had no follow-up since that office visit.  He has applied for financial assistance today and pharmacy is working on obtaining advancing access.  Douglas Conway continues to take his Biktarvy as prescribed no adverse side effects or missed doses.  Overall feeling well today with new onset arm pain located in his left arm going on for approximately a couple of weeks with no acute injury or trauma noted. Denies fevers, chills, night sweats, headaches, changes in vision, neck pain/stiffness, nausea, diarrhea, vomiting, lesions or rashes.  Douglas Conway has approximately 2 weeks of medication remaining and applied for financial assistance through UMAP/ADAP today with our pharmacy staff working on advancing access.  Denies feelings of being down, depressed, or hopeless recently.  Currently smoking approximately 3 to 4 cigarettes/day but has a 1 pack/day history for about 50 years and is working on slowing down.  No current recreational or illicit drug use and alcohol about 1 sixpack per week on average.  He is not currently sexually active and is interested in obtaining condoms today.  Not currently working.  Allergies  Allergen Reactions  . Plavix [Clopidogrel Bisulfate] Rash      Outpatient Medications Prior to Visit  Medication Sig Dispense Refill  . bictegravir-emtricitabine-tenofovir AF (BIKTARVY) 50-200-25 MG TABS tablet Take 1 tablet by mouth daily.    Marland Kitchen acyclovir (ZOVIRAX) 200 MG capsule Take 200 mg by mouth 2 (two) times daily.    .  carvedilol (COREG) 6.25 MG tablet Take 6.25 mg by mouth 2 (two) times daily.    . digoxin (LANOXIN) 0.125 MG tablet Take 125 mcg by mouth daily.    Marland Kitchen levothyroxine (SYNTHROID) 150 MCG tablet Take 1 tablet (150 mcg total) by mouth daily. 30 tablet 0  . lisinopril (PRINIVIL,ZESTRIL) 10 MG tablet Take 10 mg by mouth daily.    . pravastatin (PRAVACHOL) 40 MG tablet Take 40 mg by mouth daily.    Marland Kitchen sulfamethoxazole-trimethoprim (BACTRIM DS) 800-160 MG tablet Take 1 tablet by mouth 2 (two) times daily. (Patient not taking: Reported on 01/07/2019) 20 tablet 0   No facility-administered medications prior to visit.      Past Medical History:  Diagnosis Date  . Coronary artery disease   . HIV (human immunodeficiency virus infection) (HCC)   . Thyroid disease      Past Surgical History:  Procedure Laterality Date  . CORONARY ANGIOPLASTY WITH STENT PLACEMENT         Review of Systems  Constitutional: Negative for appetite change, chills, fatigue, fever and unexpected weight change.  Eyes: Negative for visual disturbance.  Respiratory: Negative for cough, chest tightness, shortness of breath and wheezing.   Cardiovascular: Negative for chest pain and leg swelling.  Gastrointestinal: Negative for abdominal pain, constipation, diarrhea, nausea and vomiting.  Genitourinary: Negative for dysuria, flank pain, frequency, genital sores, hematuria and urgency.  Skin: Negative for rash.  Allergic/Immunologic: Negative for immunocompromised state.  Neurological:  Negative for dizziness and headaches.      Objective:    BP 119/74   Pulse 69   Temp 97.9 F (36.6 C) (Oral)   SpO2 100%  Nursing note and vital signs reviewed.  Physical Exam Constitutional:      General: He is not in acute distress.    Appearance: He is well-developed.  Eyes:     Conjunctiva/sclera: Conjunctivae normal.  Neck:     Musculoskeletal: Neck supple.  Cardiovascular:     Rate and Rhythm: Normal rate and regular  rhythm.     Heart sounds: Normal heart sounds. No murmur. No friction rub. No gallop.   Pulmonary:     Effort: Pulmonary effort is normal. No respiratory distress.     Breath sounds: Normal breath sounds. No wheezing or rales.  Chest:     Chest wall: No tenderness.  Abdominal:     General: Bowel sounds are normal.     Palpations: Abdomen is soft.     Tenderness: There is no abdominal tenderness.  Lymphadenopathy:     Cervical: No cervical adenopathy.  Skin:    General: Skin is warm and dry.     Findings: No rash.  Neurological:     Mental Status: He is alert and oriented to person, place, and time.  Psychiatric:        Behavior: Behavior normal.        Thought Content: Thought content normal.        Judgment: Judgment normal.      No flowsheet data found.     Assessment & Plan:    Patient Active Problem List   Diagnosis Date Noted  . Lateral epicondylitis 01/07/2019  . Other specified hypothyroidism 01/07/2019  . HIV disease ([place]) 06/28/2018  . 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 Hypertension 06/28/2018  . Healthcare maintenance 06/28/2018     Problem List Items Addressed This Visit      Cardiovascular and Mediastinum   Hypertension - Primary   Relevant Medications   carvedilol (COREG) 6.25 MG tablet   digoxin (LANOXIN) 0.125 MG tablet   lisinopril (ZESTRIL) 10 MG tablet   pravastatin (PRAVACHOL) 40 MG tablet     Endocrine   Other specified hypothyroidism    Previously diagnosed with hypothyroidism and maintained on levothyroxine at 150 mcg daily.  Check TSH, free T3, and free T4.  Continue current dose of levothyroxine pending blood work results with recommended follow-up with primary care.      Relevant Medications   carvedilol (COREG) 6.25 MG tablet   levothyroxine (SYNTHROID) 150 MCG tablet   Other Relevant Orders   TSH   T4, free   Digoxin level     Musculoskeletal and Integument   Lateral epicondylitis    Douglas Conway appears to have  signs/symptoms consistent with lateral epicondylitis most likely from repetitive use.  Treat conservatively with ice, compression, tennis elbow strap, and home exercise therapy.  Recommend follow-up with orthopedics/sports medicine if symptoms worsen or do not improve.        Other   HIV disease [facility])    Douglas Conway appears to have adequately controlled HIV disease despite less than optimal adherence to follow-up office visits with good adherence and tolerance to his ART regimen of Biktarvy.  Discussed importance of routine follow-up and taking medication as prescribed.  He has no problems obtaining medication from the pharmacy and has no signs/symptoms of opportunistic infection or progressive HIV disease.  Continue current dose of Biktarvy.  Check blood work today.  Plan for follow-up in 1 months or sooner if needed.      Relevant Medications   valACYclovir (VALTREX) 500 MG tablet   Other Relevant Orders   CBC   COMPLETE METABOLIC PANEL WITH GFR   HIV-1 RNA quant-no reflex-bld   T-helper cell (CD4)- ([facility] clinic only)   Healthcare maintenance     Influenza and Prevnar updated today.  Discussed importance of safe sexual practice to reduce risk of acquisition/transmission of STI.  Condoms provided.       Other Visit Diagnoses    History of herpes zoster       Relevant Medications   valACYclovir (VALTREX) 500 MG tablet   Pharmacologic therapy       Relevant Medications   pravastatin (PRAVACHOL) 40 MG tablet   Screening examination for venereal disease       Relevant Orders   RPR       I have discontinued Douglas Conway's acyclovir and sulfamethoxazole-trimethoprim. I have also changed his carvedilol, digoxin, lisinopril, and pravastatin. Additionally, I am having him start on valACYclovir. Lastly, I am having him maintain his bictegravir-emtricitabine-tenofovir AF and levothyroxine.   Meds ordered this encounter  Medications  . valACYclovir (VALTREX) 500 MG tablet    Sig: Take 1  tablet (500 mg total) by mouth 2 (two) times daily.    Dispense:  60 tablet    Refill:  1    Order Specific Question:   Supervising Provider    Answer:   Judyann Munson [[id number]]  . carvedilol (COREG) 6.25 MG tablet    Sig: Take 1 tablet (6.25 mg total) by mouth 2 (two) times daily.    Dispense:  60 tablet    Refill:  0    Order Specific Question:   Supervising Provider    Answer:   Judyann Munson [[id number]]  . digoxin (LANOXIN) 0.125 MG tablet    Sig: Take 1 tablet (125 mcg total) by mouth daily.    Dispense:  30 tablet    Refill:  0    Order Specific Question:   Supervising Provider    Answer:   Judyann Munson [[id number]]  . levothyroxine (SYNTHROID) 150 MCG tablet    Sig: Take 1 tablet (150 mcg total) by mouth daily.    Dispense:  30 tablet    Refill:  0    Order Specific Question:   Supervising Provider    Answer:   Judyann Munson [[id number]]  . lisinopril (ZESTRIL) 10 MG tablet    Sig: Take 1 tablet (10 mg total) by mouth daily.    Dispense:  30 tablet    Refill:  0    Order Specific Question:   Supervising Provider    Answer:   Judyann Munson [[id number]]  . pravastatin (PRAVACHOL) 40 MG tablet    Sig: Take 1 tablet (40 mg total) by mouth daily.    Dispense:  30 tablet    Refill:  0    Order Specific Question:   Supervising Provider    Answer:   Judyann Munson [[id number]]     Follow-up: Return in about 1 month (around 02/06/2019).   Marcos Eke, MSN, FNP-C Nurse Practitioner [facility] Main number: [phone]

## 2019-01-07 NOTE — Addendum Note (Signed)
Addended by: Landis Gandy on: 01/07/2019 02:31 PM   Modules accepted: Orders

## 2019-01-07 NOTE — Assessment & Plan Note (Signed)
Douglas Conway appears to have signs/symptoms consistent with lateral epicondylitis most likely from repetitive use.  Treat conservatively with ice, compression, tennis elbow strap, and home exercise therapy.  Recommend follow-up with orthopedics/sports medicine if symptoms worsen or do not improve.

## 2019-01-07 NOTE — Patient Instructions (Addendum)
Nice to see you.  Refills of your medication have been sent to the pharmacy.   We will check your blood work today.  We will let you know when approved for Biktarvy - hopefully today.   Please work to establish with a primary care provider to continue with medication and treatment.  Plan for follow up in 1 month or sooner if needed.   Have a great day and stay safe.   For your arm - recommend ice x 20 minutes every 2 hours as needed.   Tennis Elbow Rehab Ask your health care provider which exercises are safe for you. Do exercises exactly as told by your health care provider and adjust them as directed. It is normal to feel mild stretching, pulling, tightness, or discomfort as you do these exercises. Stop right away if you feel sudden pain or your pain gets worse. Do not begin these exercises until told by your health care provider. Stretching and range-of-motion exercises These exercises warm up your muscles and joints and improve the movement and flexibility of your elbow. These exercises also help to relieve pain, numbness, and tingling. Wrist flexion, assisted  1. Straighten your left / right elbow in front of you with your palm facing down toward the floor. ? If told by your health care provider, bend your left / right elbow to a 90-degree angle (right angle) at your side. 2. With your other hand, gently push over the back of your left / right hand so your fingers point toward the floor (flexion). Stop when you feel a gentle stretch on the back of your forearm. 3. Hold this position for __________ seconds. Repeat __________ times. Complete this exercise __________ times a day. Wrist extension, assisted  1. Straighten your left / right elbow in front of you with your palm facing up toward the ceiling. ? If told by your health care provider, bend your left / right elbow to a 90-degree angle (right angle) at your side. 2. With your other hand, gently pull your left / right hand and  fingers toward the floor (extension). Stop when you feel a gentle stretch on the palm side of your forearm. 3. Hold this position for __________ seconds. Repeat __________ times. Complete this exercise __________ times a day. Assisted forearm rotation, supination 1. Sit or stand with your left / right elbow bent to a 90-degree angle (right angle) at your side. 2. Using your uninjured hand, turn (rotate) your left / right palm up toward the ceiling (supination) until you feel a gentle stretch along the inside of your forearm. 3. Hold this position for __________ seconds. Repeat __________ times. Complete this exercise __________ times a day. Assisted forearm rotation, pronation 1. Sit or stand with your left / right elbow bent to a 90-degree angle (right angle) at your side. 2. Using your uninjured hand, rotate your left / right palm down toward the floor (pronation) until you feel a gentle stretch along the outside of your forearm. 3. Hold this position for __________ seconds. Repeat __________ times. Complete this exercise __________ times a day. Strengthening exercises These exercises build strength and endurance in your forearm and elbow. Endurance is the ability to use your muscles for a long time, even after they get tired. Radial deviation  1. Stand with a __________ weight or a hammer in your left / right hand. Or, sit while holding a rubber exercise band or tubing, with your left / right forearm supported on a table or countertop. ?  If you are standing, position your forearm so that your thumb is facing forward. If you are sitting, position your forearm so that the thumb is facing the ceiling. This is the neutral position. 2. Raise your hand upward in front of you so your thumb moves toward the ceiling (radial deviation), or pull up on the rubber tubing. Keep your forearm and elbow still while you move your wrist only. 3. Hold this position for __________ seconds. 4. Slowly return to the  starting position. Repeat __________ times. Complete this exercise __________ times a day. Wrist extension, eccentric 1. Sit with your left / right forearm palm-down and supported on a table or other surface. Let your left / right wrist extend over the edge of the surface. 2. Hold a __________ weight or a piece of exercise band or tubing in your left / right hand. ? If using a rubber exercise band or tubing, hold the other end of the tubing with your other hand. 3. Use your uninjured hand to move your left / right hand up toward the ceiling. 4. Take your uninjured hand away and slowly return to the starting position using only your left / right hand. Lowering your arm under tension is called eccentric extension. Repeat __________ times. Complete this exercise __________ times a day. Wrist extension Do not do this exercise if it causes pain at the outside of your elbow. Only do this exercise once instructed by your health care provider. 1. Sit with your left / right forearm supported on a table or other surface and your palm turned down toward the floor. Let your left / right wrist extend over the edge of the surface. 2. Hold a __________ weight or a piece of rubber exercise band or tubing. ? If you are using a rubber exercise band or tubing, hold the band or tubing in place with your other hand to provide resistance. 3. Slowly bend your wrist so your hand moves up toward the ceiling (extension). Move only your wrist, keeping your forearm and elbow still. 4. Hold this position for __________ seconds. 5. Slowly return to the starting position. Repeat __________ times. Complete this exercise __________ times a day. Forearm rotation, supination To do this exercise, you will need a lightweight hammer or rubber mallet. 1. Sit with your left / right forearm supported on a table or other surface. Bend your elbow to a 90-degree angle (right angle). Position your forearm so that your palm is facing down  toward the floor, with your hand resting over the edge of the table. 2. Hold a hammer in your left / right hand. ? To make this exercise easier, hold the hammer near the head of the hammer. ? To make this exercise harder, hold the hammer near the end of the handle. 3. Without moving your wrist or elbow, slowly rotate your forearm so your palm faces up toward the ceiling (supination). 4. Hold this position for __________ seconds. 5. Slowly return to the starting position. Repeat __________ times. Complete this exercise __________ times a day. Shoulder blade squeeze 1. Sit in a stable chair or stand with good posture. If you are sitting down, do not let your back touch the back of the chair. 2. Your arms should be at your sides with your elbows bent to a 90-degree angle (right angle). Position your forearms so that your thumbs are facing the ceiling (neutral position). 3. Without lifting your shoulders up, squeeze your shoulder blades tightly together. 4. Hold this position for  __________ seconds. 5. Slowly release and return to the starting position. Repeat __________ times. Complete this exercise __________ times a day. This information is not intended to replace advice given to you by your health care provider. Make sure you discuss any questions you have with your health care provider. Document Released: 03/28/2005 Document Revised: 07/19/2018 Document Reviewed: 05/22/2018 Elsevier Patient Education  2020 [employer].

## 2019-01-07 NOTE — Telephone Encounter (Signed)
[facility] Patient Advocate Encounter   Patient has been rejected for [facility] Advancing Access Patient Assistance Program for 30-day coverage while pending adap application. The application for adap was sent today and should take about 4 weeks to process. Will call Advancing Access and see if we can get a supplemental 30-day bridge since the patient has already used the previous coverage.   Patient was enrolled into the program for full coverage and adap pending status. Coverage will terminate when adap is approved. Billing info listed below:  BIN: [id number] ID: [id number] Group: [id number] PCN: [id number]

## 2019-01-07 NOTE — Assessment & Plan Note (Signed)
Previously diagnosed with hypothyroidism and maintained on levothyroxine at 150 mcg daily.  Check TSH, free T3, and free T4.  Continue current dose of levothyroxine pending blood work results with recommended follow-up with primary care.

## 2019-01-08 LAB — T-HELPER CELL (CD4) - (RCID CLINIC ONLY)
CD4 % Helper T Cell: 32 % — ABNORMAL LOW (ref 33–65)
CD4 T Cell Abs: 981 /uL (ref 400–1790)

## 2019-01-09 LAB — COMPLETE METABOLIC PANEL WITH GFR
AG Ratio: 1.6 (calc) (ref 1.0–2.5)
ALT: 27 U/L (ref 9–46)
AST: 45 U/L — ABNORMAL HIGH (ref 10–35)
Albumin: 4.2 g/dL (ref 3.6–5.1)
Alkaline phosphatase (APISO): 67 U/L (ref 35–144)
BUN/Creatinine Ratio: 33 (calc) — ABNORMAL HIGH (ref 6–22)
BUN: 33 mg/dL — ABNORMAL HIGH (ref 7–25)
CO2: 26 mmol/L (ref 20–32)
Calcium: 9 mg/dL (ref 8.6–10.3)
Chloride: 106 mmol/L (ref 98–110)
Creat: 1.01 mg/dL (ref 0.70–1.25)
GFR, Est African American: 93 mL/min/{1.73_m2} (ref >=60)
GFR, Est Non African American: 80 mL/min/{1.73_m2} (ref >=60)
Globulin: 2.6 g/dL (calc) (ref 1.9–3.7)
Glucose, Bld: 96 mg/dL (ref 65–99)
Potassium: 3.9 mmol/L (ref 3.5–5.3)
Sodium: 137 mmol/L (ref 135–146)
Total Bilirubin: 0.7 mg/dL (ref 0.2–1.2)
Total Protein: 6.8 g/dL (ref 6.1–8.1)

## 2019-01-09 LAB — HIV-1 RNA QUANT-NO REFLEX-BLD
HIV 1 RNA Quant: <20 copies/mL — AB
HIV-1 RNA Quant, Log: <1.3 Log copies/mL — AB

## 2019-01-09 LAB — CBC
HCT: 41.3 % (ref 38.5–50.0)
Hemoglobin: 14.4 g/dL (ref 13.2–17.1)
MCH: 32.6 pg (ref 27.0–33.0)
MCHC: 34.9 g/dL (ref 32.0–36.0)
MCV: 93.4 fL (ref 80.0–100.0)
MPV: 9.6 fL (ref 7.5–12.5)
Platelets: 319 10*3/uL (ref 140–400)
RBC: 4.42 10*6/uL (ref 4.20–5.80)
RDW: 14.6 % (ref 11.0–15.0)
WBC: 7.5 10*3/uL (ref 3.8–10.8)

## 2019-01-09 LAB — DIGOXIN LEVEL: Digoxin Level: <0.5 mcg/L — ABNORMAL LOW (ref 0.8–2.0)

## 2019-01-09 LAB — T4, FREE: Free T4: 1 ng/dL (ref 0.8–1.8)

## 2019-01-09 LAB — RPR: RPR Ser Ql: NONREACTIVE

## 2019-01-09 LAB — TSH: TSH: 5.66 mIU/L — ABNORMAL HIGH (ref 0.40–4.50)

## 2019-01-15 ENCOUNTER — Encounter: Payer: Self-pay | Admitting: Family

## 2019-01-16 ENCOUNTER — Other Ambulatory Visit: Payer: Self-pay | Admitting: Family

## 2019-01-16 MED ORDER — BICTEGRAVIR-EMTRICITAB-TENOFOV 50-200-25 MG PO TABS: 1.0000 | ORAL_TABLET | Freq: Every day | ORAL | 2 refills | Status: DC

## 2019-01-21 ENCOUNTER — Telehealth: Payer: Self-pay

## 2019-01-21 NOTE — Telephone Encounter (Signed)
Patient left voicemail on triage line stating he is unable to afford his copay for Valtrex, and would like to restart acyclovir. Patient is requesting his prescriptions go to [facility] in [place], [place]. Called patient to follow up on message. Informed patient that since he is ADAP he would need to use [facility] or [facility] [address] to receive medication and still be covered under ADAP. Patient verbalized understanding and will contact mail order pharmacy to have medication delivered. Patient would also like to know if he would be able to go back on acyclovir. Will route message to Terri Piedra, FNP to advise. [place]

## 2019-01-22 NOTE — Telephone Encounter (Signed)
Voicemail:  Patient is calling regarding changing valacyclovir to acyclovir and has called pharmacy and the medication is  not there.   Message left for patient, he can only get medications through [place] at [facility].  I asked him to call our office.   Laverle Patter, RN

## 2019-01-22 NOTE — Telephone Encounter (Signed)
Valacyclovir is on the UMAP/ADAP formulary so he should be able to get that for free. If he still wishes to have acyclovir we can switch it.

## 2019-01-25 ENCOUNTER — Other Ambulatory Visit: Payer: Self-pay | Admitting: *Deleted

## 2019-01-25 DIAGNOSIS — Z8619 Personal history of other infectious and parasitic diseases: Secondary | ICD-10-CM

## 2019-01-25 MED ORDER — VALACYCLOVIR HCL 500 MG PO TABS: 500.0000 mg | ORAL_TABLET | Freq: Two times a day (BID) | ORAL | 1 refills | Status: DC

## 2019-01-31 ENCOUNTER — Telehealth: Payer: Self-pay | Admitting: Pharmacy Technician

## 2019-01-31 ENCOUNTER — Other Ambulatory Visit: Payer: Self-pay | Admitting: Pharmacist

## 2019-01-31 DIAGNOSIS — Z8619 Personal history of other infectious and parasitic diseases: Secondary | ICD-10-CM

## 2019-01-31 MED ORDER — VALACYCLOVIR HCL 500 MG PO TABS: 500.0000 mg | ORAL_TABLET | Freq: Two times a day (BID) | ORAL | 1 refills | Status: DC

## 2019-01-31 NOTE — Telephone Encounter (Signed)
[facility] PATIENT ADVOCATE  Douglas Conway called our financial counselor today to let her know his pharmacy states his Valtrex is not covered.  He applied for HMAP and is still not approved.  I left him a voicemail asking him to call me back.  I can arrange for him to get a 30 day supply through [place] for $9.  If he cannot pay the $9 with a credit card, we can put in on accounts receivable and bill him later.  We need to confirm that he wants [place] to mail it to him at that price and we also need to verify his mailing address.  Venida Jarvis. Nadara Mustard [place] Patient [facility] Phone: [phone] Fax:  [phone]

## 2019-01-31 NOTE — Progress Notes (Signed)
Printing Valtrex Rx for patient assistance.

## 2019-02-04 ENCOUNTER — Other Ambulatory Visit: Payer: Self-pay

## 2019-02-04 ENCOUNTER — Ambulatory Visit (INDEPENDENT_AMBULATORY_CARE_PROVIDER_SITE_OTHER): Payer: Self-pay | Admitting: Family

## 2019-02-04 ENCOUNTER — Encounter: Payer: Self-pay | Admitting: Family

## 2019-02-04 VITALS — BP 146/87 | HR 91 | Temp 97.7°F | Wt 185.0 lb

## 2019-02-04 DIAGNOSIS — I10 Essential (primary) hypertension: Secondary | ICD-10-CM

## 2019-02-04 DIAGNOSIS — E038 Other specified hypothyroidism: Secondary | ICD-10-CM

## 2019-02-04 DIAGNOSIS — B20 Human immunodeficiency virus [HIV] disease: Secondary | ICD-10-CM

## 2019-02-04 MED ORDER — LEVOTHYROXINE SODIUM 150 MCG PO TABS: 150.0000 ug | ORAL_TABLET | Freq: Every day | ORAL | 0 refills | Status: DC

## 2019-02-04 NOTE — Progress Notes (Signed)
Subjective:    Patient ID: Douglas Conway, male    DOB: [birth year], 63 y.o.   MRN: [id number]  Chief Complaint  Patient presents with  . HIV Positive/AIDS     HPI:  Douglas Conway is a 63 y.o. male with HIV disease who was last seen in the office on 01/07/2019 with good adherence and tolerance to his ART regimen of Biktarvy.  He is also noted to have lateral epicondylitis and hypothyroidism.  No blood work has been completed prior to this appointment.  Douglas Conway continues to take his Biktarvy as prescribed no adverse side effects or missed doses.  Continues to have left arm/elbow pain located around his lateral epicondyle and has problems rotating into supination and pronation.  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 Denies fevers, chills, night sweats, headaches, changes in vision, neck pain/stiffness, nausea, diarrhea, vomiting, lesions or rashes.  Douglas Conway has no problems obtaining his medication from the pharmacy however he is not taking his other medications with the exception of Biktarvy.  He is awaiting financial assistance approval.  Denies any feelings of being down, depressed, or hopeless recently.  Not currently sexually active.  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  His goal is to obtain transportation and currently uses public modes of transportation as well as other vehicles.    Allergies  Allergen Reactions  . Plavix [Clopidogrel Bisulfate] Rash      Outpatient Medications Prior to Visit  Medication Sig Dispense Refill  . bictegravir-emtricitabine-tenofovir AF (BIKTARVY) 50-200-25 MG TABS tablet Take 1 tablet by mouth daily. 30 tablet 2  . carvedilol (COREG) 6.25 MG tablet Take 1 tablet (6.25 mg total) by mouth 2 (two) times daily. 60 tablet 0  . digoxin (LANOXIN) 0.125 MG tablet Take 1 tablet (125 mcg total) by mouth daily. 30 tablet 0  . lisinopril (ZESTRIL) 10 MG tablet Take 1 tablet (10 mg total) by mouth daily. 30  tablet 0  . pravastatin (PRAVACHOL) 40 MG tablet Take 1 tablet (40 mg total) by mouth daily. 30 tablet 0  . valACYclovir (VALTREX) 500 MG tablet Take 1 tablet (500 mg total) by mouth 2 (two) times daily. 60 tablet 1  . levothyroxine (SYNTHROID) 150 MCG tablet Take 1 tablet (150 mcg total) by mouth daily. 30 tablet 0   No facility-administered medications prior to visit.      Past Medical History:  Diagnosis Date  . Coronary artery disease   . HIV (human immunodeficiency virus infection) (HCC)   . Thyroid disease      Past Surgical History:  Procedure Laterality Date  . CORONARY ANGIOPLASTY WITH STENT PLACEMENT         Review of Systems  Constitutional: Negative for appetite change, chills, diaphoresis, fatigue, fever and unexpected weight change.  Eyes: Negative for visual disturbance.  Respiratory: Negative for cough, chest tightness, shortness of breath and wheezing.   Cardiovascular: Negative for chest pain and leg swelling.  Gastrointestinal: Negative for abdominal pain, constipation, diarrhea, nausea and vomiting.  Genitourinary: Negative for dysuria, flank pain, frequency, genital sores, hematuria and urgency.  Musculoskeletal:       Positive for left elbow pain  Skin: Negative for rash.  Allergic/Immunologic: Negative for immunocompromised state.  Neurological: Negative for dizziness and headaches.      Objective:    BP (!) 146/87   Pulse 91   Temp 97.7 F (36.5 C)   Wt 185 lb (83.9 kg)  BMI 24.41 kg/m  Nursing note and vital signs reviewed.  Physical Exam Constitutional:      General: He is not in acute distress.    Appearance: He is well-developed.  Eyes:     Conjunctiva/sclera: Conjunctivae normal.  Neck:     Musculoskeletal: Neck supple.  Cardiovascular:     Rate and Rhythm: Normal rate and regular rhythm.     Heart sounds: Normal heart sounds. No murmur. No friction rub. No gallop.   Pulmonary:     Effort: Pulmonary effort is normal. No  respiratory distress.     Breath sounds: Normal breath sounds. No wheezing or rales.  Chest:     Chest wall: No tenderness.  Abdominal:     General: Bowel sounds are normal.     Palpations: Abdomen is soft.     Tenderness: There is no abdominal tenderness.  Lymphadenopathy:     Cervical: No cervical adenopathy.  Skin:    General: Skin is warm and dry.     Findings: No rash.  Neurological:     Mental Status: He is alert and oriented to person, place, and time.  Psychiatric:        Behavior: Behavior normal.        Thought Content: Thought content normal.        Judgment: Judgment normal.     Depression screen PHQ 2/9 02/04/2019  Decreased Interest 0  Down, Depressed, Hopeless 0  PHQ - 2 Score 0       Assessment & Plan:    Patient Active Problem List   Diagnosis Date Noted  . Lateral epicondylitis 01/07/2019  . Other specified hypothyroidism 01/07/2019  . HIV disease (HCC) 06/28/2018  . 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 Hypertension 06/28/2018  . Healthcare maintenance 06/28/2018     Problem List Items Addressed This Visit      Cardiovascular and Mediastinum   Hypertension    Blood pressure mildly improved today.  He indicates he has not had hypertension in the past.  Continue current dose of carvedilol and lisinopril.  Recommend follow-up with primary care for additional treatment and plan of care.        Endocrine   Other specified hypothyroidism    Douglas Conway has previously elevated TSH levels and currently maintained on 150 mcg of levothyroxine.  Discussed importance of taking medication as prescribed to lower this number.  Refills of medication sent to the pharmacy.  Plan for recheck of TSH in 3 months or sooner if needed.  Recommend follow-up with primary care for additional assessment and treatment as indicated.      Relevant Medications   levothyroxine (SYNTHROID) 150 MCG tablet     Other   HIV disease (HCC) - Primary    Douglas Conway has  well-controlled HIV disease with good adherence and tolerance to his ART regimen of Biktarvy.  No signs/symptoms of opportunistic infection or progressive HIV disease at present.  He has no problems obtaining his medication from the pharmacy.  Discussed plan of care.  Continue current dose of Biktarvy.  Plan for follow-up in 3 months or sooner if needed with lab work 1 to 2 weeks prior to appointment or on same day.          I am having Algis Liming maintain his carvedilol, digoxin, lisinopril, pravastatin, bictegravir-emtricitabine-tenofovir AF, valACYclovir, and levothyroxine.   Meds ordered this encounter  Medications  . levothyroxine (SYNTHROID) 150 MCG tablet    Sig: Take 1 tablet (150 mcg  total) by mouth daily.    Dispense:  90 tablet    Refill:  0    Order Specific Question:   Supervising Provider    Answer:   Carlyle Basques [[id number]]     Follow-up: Return in about 3 months (around 05/07/2019).   Terri Piedra, MSN, FNP-C Nurse Practitioner [facility] [place] number: [phone]

## 2019-02-04 NOTE — Patient Instructions (Signed)
Nice to see you.  Continue to take your [place] as prescribed daily.  Levothyroxine has been sent to [facility] in [place] - should be $10.  For elbow ice x 20 minutes every 2 hours as needed and following activity.  Stretches and exercises below.  Follow up in 3 months or sooner if needed with blood work same day as appointment.    [facility] Elbow Re[facility] Ask your health care provider which exercises are safe for you. Do exercises exactly as told by your health care provider and adjust them as directed. It is normal to feel mild stretching, pulling, tightness, or discomfort as you do these exercises. Stop right away if you feel sudden pain or your pain gets worse. Do not begin these exercises until told by your health care provider. Stretching and range-of-motion exercises These exercises warm up your muscles and joints and improve the movement and flexibility of your elbow. These exercises also help to relieve pain, numbness, and tingling. Wrist flexion, assisted  1. Straighten your left / right elbow in front of you with your palm facing down toward the floor. ? If told by your health care provider, bend your left / right elbow to a 90-degree angle (right angle) at your side. 2. With your other hand, gently push over the back of your left / right hand so your fingers point toward the floor (flexion). Stop when you feel a gentle stretch on the back of your forearm. 3. Hold this position for __________ seconds. Repeat __________ times. Complete this exercise __________ times a day. Wrist extension, assisted  1. Straighten your left / right elbow in front of you with your palm facing up toward the ceiling. ? If told by your health care provider, bend your left / right elbow to a 90-degree angle (right angle) at your side. 2. With your other hand, gently pull your left / right hand and fingers toward the floor (extension). Stop when you feel a gentle stretch on the palm side of your forearm. 3.  Hold this position for __________ seconds. Repeat __________ times. Complete this exercise __________ times a day. Assisted forearm rotation, supination 1. Sit or stand with your left / right elbow bent to a 90-degree angle (right angle) at your side. 2. Using your uninjured hand, turn (rotate) your left / right palm up toward the ceiling (supination) until you feel a gentle stretch along the inside of your forearm. 3. Hold this position for __________ seconds. Repeat __________ times. Complete this exercise __________ times a day. Assisted forearm rotation, pronation 1. Sit or stand with your left / right elbow bent to a 90-degree angle (right angle) at your side. 2. Using your uninjured hand, rotate your left / right palm down toward the floor (pronation) until you feel a gentle stretch along the outside of your forearm. 3. Hold this position for __________ seconds. Repeat __________ times. Complete this exercise __________ times a day. Strengthening exercises These exercises build strength and endurance in your forearm and elbow. Endurance is the ability to use your muscles for a long time, even after they get tired. Radial deviation  1. Stand with a __________ weight or a hammer in your left / right hand. Or, sit while holding a rubber exercise band or tubing, with your left / right forearm supported on a table or countertop. ? If you are standing, position your forearm so that your thumb is facing forward. If you are sitting, position your forearm so that the thumb is facing  the ceiling. This is the neutral position. 2. Raise your hand upward in front of you so your thumb moves toward the ceiling (radial deviation), or pull up on the rubber tubing. Keep your forearm and elbow still while you move your wrist only. 3. Hold this position for __________ seconds. 4. Slowly return to the starting position. Repeat __________ times. Complete this exercise __________ times a day. Wrist extension,  eccentric 1. Sit with your left / right forearm palm-down and supported on a table or other surface. Let your left / right wrist extend over the edge of the surface. 2. Hold a __________ weight or a piece of exercise band or tubing in your left / right hand. ? If using a rubber exercise band or tubing, hold the other end of the tubing with your other hand. 3. Use your uninjured hand to move your left / right hand up toward the ceiling. 4. Take your uninjured hand away and slowly return to the starting position using only your left / right hand. Lowering your arm under tension is called eccentric extension. Repeat __________ times. Complete this exercise __________ times a day. Wrist extension Do not do this exercise if it causes pain at the outside of your elbow. Only do this exercise once instructed by your health care provider. 1. Sit with your left / right forearm supported on a table or other surface and your palm turned down toward the floor. Let your left / right wrist extend over the edge of the surface. 2. Hold a __________ weight or a piece of rubber exercise band or tubing. ? If you are using a rubber exercise band or tubing, hold the band or tubing in place with your other hand to provide resistance. 3. Slowly bend your wrist so your hand moves up toward the ceiling (extension). Move only your wrist, keeping your forearm and elbow still. 4. Hold this position for __________ seconds. 5. Slowly return to the starting position. Repeat __________ times. Complete this exercise __________ times a day. Forearm rotation, supination To do this exercise, you will need a lightweight hammer or rubber mallet. 1. Sit with your left / right forearm supported on a table or other surface. Bend your elbow to a 90-degree angle (right angle). Position your forearm so that your palm is facing down toward the floor, with your hand resting over the edge of the table. 2. Hold a hammer in your left / right  hand. ? To make this exercise easier, hold the hammer near the head of the hammer. ? To make this exercise harder, hold the hammer near the end of the handle. 3. Without moving your wrist or elbow, slowly rotate your forearm so your palm faces up toward the ceiling (supination). 4. Hold this position for __________ seconds. 5. Slowly return to the starting position. Repeat __________ times. Complete this exercise __________ times a day. Shoulder blade squeeze 1. Sit in a stable chair or stand with good posture. If you are sitting down, do not let your back touch the back of the chair. 2. Your arms should be at your sides with your elbows bent to a 90-degree angle (right angle). Position your forearms so that your thumbs are facing the ceiling (neutral position). 3. Without lifting your shoulders up, squeeze your shoulder blades tightly together. 4. Hold this position for __________ seconds. 5. Slowly release and return to the starting position. Repeat __________ times. Complete this exercise __________ times a day. This information is not intended to  replace advice given to you by your health care provider. Make sure you discuss any questions you have with your health care provider. Document Released: 03/28/2005 Document Revised: 07/19/2018 Document Reviewed: 05/22/2018 Elsevier Patient Education  2020 [employer].

## 2019-02-04 NOTE — Assessment & Plan Note (Signed)
Douglas Conway has previously elevated TSH levels and currently maintained on 150 mcg of levothyroxine.  Discussed importance of taking medication as prescribed to lower this number.  Refills of medication sent to the pharmacy.  Plan for recheck of TSH in 3 months or sooner if needed.  Recommend follow-up with primary care for additional assessment and treatment as indicated.

## 2019-02-04 NOTE — Assessment & Plan Note (Signed)
Mr. Roots has well-controlled HIV disease with good adherence and tolerance to his ART regimen of Biktarvy.  No signs/symptoms of opportunistic infection or progressive HIV disease at present.  He has no problems obtaining his medication from the pharmacy.  Discussed plan of care.  Continue current dose of Biktarvy.  Plan for follow-up in 3 months or sooner if needed with lab work 1 to 2 weeks prior to appointment or on same day.

## 2019-02-04 NOTE — Assessment & Plan Note (Signed)
Blood pressure mildly improved today.  He indicates he has not had hypertension in the past.  Continue current dose of carvedilol and lisinopril.  Recommend follow-up with primary care for additional treatment and plan of care.

## 2019-03-28 NOTE — Telephone Encounter (Signed)
Thanks Betty 

## 2019-04-04 ENCOUNTER — Other Ambulatory Visit: Payer: Self-pay | Admitting: Family

## 2019-04-04 DIAGNOSIS — B20 Human immunodeficiency virus [HIV] disease: Secondary | ICD-10-CM

## 2019-04-04 DIAGNOSIS — E038 Other specified hypothyroidism: Secondary | ICD-10-CM

## 2019-04-22 MED ORDER — BIKTARVY 50-200-25 MG PO TABS: 1.0000 | ORAL_TABLET | Freq: Every day | ORAL | 1 refills | Status: DC

## 2019-04-22 MED ORDER — LEVOTHYROXINE SODIUM 150 MCG PO TABS: 150.0000 ug | ORAL_TABLET | Freq: Every day | ORAL | 0 refills | Status: DC

## 2019-04-22 NOTE — Addendum Note (Signed)
Addended by: Valarie Cones on: 04/22/2019 03:45 PM   Modules accepted: Orders

## 2019-05-03 ENCOUNTER — Telehealth: Payer: Self-pay

## 2019-05-03 ENCOUNTER — Telehealth: Payer: Self-pay | Admitting: Pharmacy Technician

## 2019-05-03 NOTE — Telephone Encounter (Signed)
Received call from [facility] stating patient is having issue getting medication due to insurance issues. Pharmacy is requesting trial card for patient to get medication while medicare issues are resolved. Douglas Conway, [place]

## 2019-05-03 NOTE — Telephone Encounter (Signed)
[facility] Patient Advocate Encounter   Patient has been approved for [employer] Advancing Access Patient Assistance Program for [place]. This assistance will make the patient's copay $0.  The billing information is as follows and has been shared with [employer]. They were able to get a paid claim and will coordinate with the patient.   Member ID: [id number] RxBin: [id number] PCN: [id number] Group: [id number]  Patient knows to call the office with questions or concerns. His NCMED still shows incarcerated and he is unable to reach his caseworker or [facility] to get it changed.   Beulah Gandy, CPhT Specialty Pharmacy Patient [facility] Phone: [phone] Fax: [phone] 05/03/2019 9:17 AM

## 2019-05-07 ENCOUNTER — Ambulatory Visit: Admitting: Family

## 2019-05-07 ENCOUNTER — Ambulatory Visit

## 2019-05-10 ENCOUNTER — Ambulatory Visit: Payer: Self-pay | Admitting: Family

## 2019-05-14 ENCOUNTER — Ambulatory Visit (INDEPENDENT_AMBULATORY_CARE_PROVIDER_SITE_OTHER): Payer: Self-pay | Admitting: Family

## 2019-05-14 ENCOUNTER — Other Ambulatory Visit: Payer: Self-pay

## 2019-05-14 ENCOUNTER — Encounter: Payer: Self-pay | Admitting: Family

## 2019-05-14 VITALS — BP 162/99 | HR 94 | Temp 97.4°F | Ht 73.0 in | Wt 192.0 lb

## 2019-05-14 DIAGNOSIS — E038 Other specified hypothyroidism: Secondary | ICD-10-CM

## 2019-05-14 DIAGNOSIS — I5022 Chronic systolic (congestive) heart failure: Secondary | ICD-10-CM

## 2019-05-14 DIAGNOSIS — I10 Essential (primary) hypertension: Secondary | ICD-10-CM

## 2019-05-14 DIAGNOSIS — Z0000 Encounter for general adult medical examination without abnormal findings: Secondary | ICD-10-CM

## 2019-05-14 DIAGNOSIS — Z79899 Other long term (current) drug therapy: Secondary | ICD-10-CM

## 2019-05-14 DIAGNOSIS — B20 Human immunodeficiency virus [HIV] disease: Secondary | ICD-10-CM

## 2019-05-14 HISTORY — DX: Chronic systolic (congestive) heart failure: I50.22

## 2019-05-14 MED ORDER — LISINOPRIL 10 MG PO TABS: 10.0000 mg | ORAL_TABLET | Freq: Every day | ORAL | 0 refills | Status: DC

## 2019-05-14 MED ORDER — PRAVASTATIN SODIUM 40 MG PO TABS: 40.0000 mg | ORAL_TABLET | Freq: Every day | ORAL | 0 refills | Status: DC

## 2019-05-14 MED ORDER — ACYCLOVIR 400 MG PO TABS: 400.0000 mg | ORAL_TABLET | Freq: Every day | ORAL | 3 refills | Status: DC

## 2019-05-14 MED ORDER — CARVEDILOL 6.25 MG PO TABS: 6.2500 mg | ORAL_TABLET | Freq: Two times a day (BID) | ORAL | 0 refills | Status: DC

## 2019-05-14 MED ORDER — LEVOTHYROXINE SODIUM 150 MCG PO TABS: 150.0000 ug | ORAL_TABLET | Freq: Every day | ORAL | 0 refills | Status: DC

## 2019-05-14 MED ORDER — DIGOXIN 125 MCG PO TABS: 125.0000 ug | ORAL_TABLET | Freq: Every day | ORAL | 0 refills | Status: DC

## 2019-05-14 NOTE — Assessment & Plan Note (Signed)
Currently stable with current dose of levothyroxine.  Check TSH, T3 and T4 today.  Continue current dose of levothyroxine.

## 2019-05-14 NOTE — Assessment & Plan Note (Signed)
   All immunizations up-to-date per recommendations.  Discussed importance of safe sexual practice to reduce risk of STI.  Condoms declined.

## 2019-05-14 NOTE — Assessment & Plan Note (Signed)
Previously diagnosed with heart failure with most recent echocardiogram showing approximately 30% ejection fraction per patient recollection.  He remains on digoxin which she has not been able to get over the last 3 months.  We will refill digoxin and check digoxin levels today.  Refer to cardiology for further echocardiogram and additional treatment as indicated.

## 2019-05-14 NOTE — Assessment & Plan Note (Signed)
Mr. Milosevic has been taking his Biktarvy as prescribed with no adverse side effects.  Continues to have some insurance challenges in obtaining his medication which is currently being worked on by [employer].  No signs/symptoms of opportunistic infection or progressive HIV disease.  Reviewed his lab work and discussed plan of care.  Continue current dose of Biktarvy.  Plan for follow-up in 3 months or sooner if needed with lab work 1 to 2 weeks prior to appointment.

## 2019-05-14 NOTE — Patient Instructions (Signed)
Nice to see you.  We will check your blood work today.  Refills have been sent to the pharmacy.  Continue to take your medications as prescribed daily.  A referral has been placed for cardiology.  Plan for follow up in 3 months or sooner if needed.   Have a great day and stay!

## 2019-05-14 NOTE — Assessment & Plan Note (Signed)
Blood pressure is elevated today above goal 140/90.  Restart carvedilol ands lisinopril.  No red flag/warning symptoms. Encouraged to monitor blood pressure at home and continue medication.

## 2019-05-14 NOTE — Progress Notes (Signed)
Subjective:    Patient ID: Douglas Conway, male    DOB: 19-Oct-1955, 64 y.o.   MRN: [id number]  Chief Complaint  Patient presents with  . HIV Positive/AIDS     HPI:  Douglas Conway is a 64 y.o. male with HIV disease who was last seen in the office on 02/04/20 to establish/transfer care for his HIV disease. His viral load at the time was undetectable and CD4 count was 981. He was continued on his ART regimen of Biktarvy.   Douglas Conway has been taking his Biktarvy as prescribed with no adverse side effects. He did miss about 2 weeks of medication due to problems with his insurance. He restarted taking the medication 1 week ago. Overall feeling well with the concern for obtaining medication. Denies fevers, chills, night sweats, headaches, changes in vision, neck pain/stiffness, nausea, diarrhea, vomiting, lesions or rashes.  Douglas Conway is having problems with obtaining his medication as there is an issue with Medicaid and applying for UMAP/ADAP. He has not been able to get any of his other medications since his initial office visit. 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 No recreational/illicit drug use, current some day smoker and drinks alcohol daily. Not currently sexually active. He has concerns about getting the rest of his medication.    Allergies  Allergen Reactions  . Plavix [Clopidogrel Bisulfate] Rash      Outpatient Medications Prior to Visit  Medication Sig Dispense Refill  . bictegravir-emtricitabine-tenofovir AF (BIKTARVY) 50-200-25 MG TABS tablet Take 1 tablet by mouth daily. 30 tablet 1  . levothyroxine (SYNTHROID) 150 MCG tablet Take 1 tablet (150 mcg total) by mouth daily. 90 tablet 0  . carvedilol (COREG) 6.25 MG tablet Take 1 tablet (6.25 mg total) by mouth 2 (two) times daily. (Patient not taking: Reported on 05/14/2019) 60 tablet 0  . digoxin (LANOXIN) 0.125 MG tablet Take 1 tablet (125 mcg total) by mouth daily. (Patient not taking: Reported on 05/14/2019) 30  tablet 0  . lisinopril (ZESTRIL) 10 MG tablet Take 1 tablet (10 mg total) by mouth daily. (Patient not taking: Reported on 05/14/2019) 30 tablet 0  . pravastatin (PRAVACHOL) 40 MG tablet Take 1 tablet (40 mg total) by mouth daily. (Patient not taking: Reported on 05/14/2019) 30 tablet 0  . valACYclovir (VALTREX) 500 MG tablet Take 1 tablet (500 mg total) by mouth 2 (two) times daily. (Patient not taking: Reported on 05/14/2019) 60 tablet 1   No facility-administered medications prior to visit.     Past Medical History:  Diagnosis Date  . Coronary artery disease   . HIV (human immunodeficiency virus infection) ([place])   . Thyroid disease      Past Surgical History:  Procedure Laterality Date  . CORONARY ANGIOPLASTY WITH STENT PLACEMENT         Review of Systems  Constitutional: Negative for appetite change, chills, fatigue, fever and unexpected weight change.  Eyes: Negative for visual disturbance.  Respiratory: Negative for cough, chest tightness, shortness of breath and wheezing.   Cardiovascular: Negative for chest pain and leg swelling.  Gastrointestinal: Negative for abdominal pain, constipation, diarrhea, nausea and vomiting.  Genitourinary: Negative for dysuria, flank pain, frequency, genital sores, hematuria and urgency.  Skin: Negative for rash.  Allergic/Immunologic: Negative for immunocompromised state.  Neurological: Negative for dizziness and headaches.      Objective:    BP (!) 162/99   Pulse 94   Temp (!) 97.4 F (36.3 C)   Ht 6\' 1"  (1.854 m)  Wt 192 lb (87.1 kg)   SpO2 97%   BMI 25.33 kg/m  Nursing note and vital signs reviewed.  Physical Exam Constitutional:      General: He is not in acute distress.    Appearance: He is well-developed.  Eyes:     Conjunctiva/sclera: Conjunctivae normal.  Cardiovascular:     Rate and Rhythm: Normal rate and regular rhythm.     Heart sounds: Normal heart sounds. No murmur. No friction rub. No gallop.   Pulmonary:      Effort: Pulmonary effort is normal. No respiratory distress.     Breath sounds: Normal breath sounds. No wheezing or rales.  Chest:     Chest wall: No tenderness.  Abdominal:     General: Bowel sounds are normal.     Palpations: Abdomen is soft.     Tenderness: There is no abdominal tenderness.  Musculoskeletal:     Cervical back: Neck supple.  Lymphadenopathy:     Cervical: No cervical adenopathy.  Skin:    General: Skin is warm and dry.     Findings: No rash.  Neurological:     Mental Status: He is alert and oriented to person, place, and time.  Psychiatric:        Behavior: Behavior normal.        Thought Content: Thought content normal.        Judgment: Judgment normal.      Depression screen PHQ 2/9 02/04/2019  Decreased Interest 0  Down, Depressed, Hopeless 0  PHQ - 2 Score 0       Assessment & Plan:    Patient Active Problem List   Diagnosis Date Noted  . Chronic systolic congestive heart failure (HCC) 05/14/2019  . Lateral epicondylitis 01/07/2019  . Other specified hypothyroidism 01/07/2019  . HIV disease (HCC) 06/28/2018  . 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 Hypertension 06/28/2018  . Healthcare maintenance 06/28/2018     Problem List Items Addressed This Visit      Cardiovascular and Mediastinum   Hypertension    Blood pressure is elevated today above goal 140/90.  Restart carvedilol ands lisinopril.  No red flag/warning symptoms. Encouraged to monitor blood pressure at home and continue medication.       Relevant Medications   digoxin (LANOXIN) 0.125 MG tablet   carvedilol (COREG) 6.25 MG tablet   lisinopril (ZESTRIL) 10 MG tablet   pravastatin (PRAVACHOL) 40 MG tablet   Chronic systolic congestive heart failure (HCC) - Primary    Previously diagnosed with heart failure with most recent echocardiogram showing approximately 30% ejection fraction per patient recollection.  He remains on digoxin which she has not been able to get over the  last 3 months.  We will refill digoxin and check digoxin levels today.  Refer to cardiology for further echocardiogram and additional treatment as indicated.      Relevant Medications   digoxin (LANOXIN) 0.125 MG tablet   carvedilol (COREG) 6.25 MG tablet   lisinopril (ZESTRIL) 10 MG tablet   pravastatin (PRAVACHOL) 40 MG tablet   Other Relevant Orders   Ambulatory referral to Cardiology   Digoxin level     Endocrine   Other specified hypothyroidism    Currently stable with current dose of levothyroxine.  Check TSH, T3 and T4 today.  Continue current dose of levothyroxine.      Relevant Medications   carvedilol (COREG) 6.25 MG tablet   levothyroxine (SYNTHROID) 150 MCG tablet   Other Relevant Orders   TSH  T3, free   T4, free     Other   HIV disease [facility])    Mr. Conway has been taking his Biktarvy as prescribed with no adverse side effects.  Continues to have some insurance challenges in obtaining his medication which is currently being worked on by [employer].  No signs/symptoms of opportunistic infection or progressive HIV disease.  Reviewed his lab work and discussed plan of care.  Continue current dose of Biktarvy.  Plan for follow-up in 3 months or sooner if needed with lab work 1 to 2 weeks prior to appointment.      Relevant Medications   acyclovir (ZOVIRAX) 400 MG tablet   Other Relevant Orders   COMPLETE METABOLIC PANEL WITH GFR   HIV-1 RNA quant-no reflex-bld   Healthcare maintenance     All immunizations up-to-date per recommendations.  Discussed importance of safe sexual practice to reduce risk of STI.  Condoms declined.       Other Visit Diagnoses    Pharmacologic therapy       Relevant Medications   pravastatin (PRAVACHOL) 40 MG tablet       I have discontinued Douglas Conway's valACYclovir. I am also having him start on acyclovir. Additionally, I am having him maintain his Biktarvy, digoxin, carvedilol, lisinopril, pravastatin, and  levothyroxine.   Meds ordered this encounter  Medications  . acyclovir (ZOVIRAX) 400 MG tablet    Sig: Take 1 tablet (400 mg total) by mouth 5 (five) times daily.    Dispense:  60 tablet    Refill:  3    Order Specific Question:   Supervising Provider    Answer:   Judyann Munson [[id number]]  . digoxin (LANOXIN) 0.125 MG tablet    Sig: Take 1 tablet (125 mcg total) by mouth daily.    Dispense:  30 tablet    Refill:  0    Order Specific Question:   Supervising Provider    Answer:   Judyann Munson [[id number]]  . carvedilol (COREG) 6.25 MG tablet    Sig: Take 1 tablet (6.25 mg total) by mouth 2 (two) times daily.    Dispense:  60 tablet    Refill:  0    Order Specific Question:   Supervising Provider    Answer:   Judyann Munson [[id number]]  . lisinopril (ZESTRIL) 10 MG tablet    Sig: Take 1 tablet (10 mg total) by mouth daily.    Dispense:  30 tablet    Refill:  0    Order Specific Question:   Supervising Provider    Answer:   Judyann Munson [[id number]]  . pravastatin (PRAVACHOL) 40 MG tablet    Sig: Take 1 tablet (40 mg total) by mouth daily.    Dispense:  30 tablet    Refill:  0    Order Specific Question:   Supervising Provider    Answer:   Judyann Munson [[id number]]  . levothyroxine (SYNTHROID) 150 MCG tablet    Sig: Take 1 tablet (150 mcg total) by mouth daily.    Dispense:  90 tablet    Refill:  0    Order Specific Question:   Supervising Provider    Answer:   Judyann Munson [[id number]]     Follow-up: Return in about 3 months (around 08/11/2019), or if symptoms worsen or fail to improve.   Marcos Eke, MSN, FNP-C Nurse Practitioner [facility] Main number: [phone]

## 2019-05-17 ENCOUNTER — Telehealth: Payer: Self-pay

## 2019-05-17 LAB — COMPLETE METABOLIC PANEL WITH GFR
AG Ratio: 1.5 (calc) (ref 1.0–2.5)
ALT: 43 U/L (ref 9–46)
AST: 43 U/L — ABNORMAL HIGH (ref 10–35)
Albumin: 4.3 g/dL (ref 3.6–5.1)
Alkaline phosphatase (APISO): 65 U/L (ref 35–144)
BUN: 22 mg/dL (ref 7–25)
CO2: 26 mmol/L (ref 20–32)
Calcium: 9.3 mg/dL (ref 8.6–10.3)
Chloride: 104 mmol/L (ref 98–110)
Creat: 0.94 mg/dL (ref 0.70–1.25)
GFR, Est African American: 101 mL/min/{1.73_m2} (ref >=60)
GFR, Est Non African American: 87 mL/min/{1.73_m2} (ref >=60)
Globulin: 2.9 g/dL (calc) (ref 1.9–3.7)
Glucose, Bld: 98 mg/dL (ref 65–99)
Potassium: 4.4 mmol/L (ref 3.5–5.3)
Sodium: 137 mmol/L (ref 135–146)
Total Bilirubin: 0.3 mg/dL (ref 0.2–1.2)
Total Protein: 7.2 g/dL (ref 6.1–8.1)

## 2019-05-17 LAB — T4, FREE: Free T4: 1.2 ng/dL (ref 0.8–1.8)

## 2019-05-17 LAB — T3, FREE: T3, Free: 3.6 pg/mL (ref 2.3–4.2)

## 2019-05-17 LAB — DIGOXIN LEVEL: Digoxin Level: <0.5 mcg/L — ABNORMAL LOW (ref 0.8–2.0)

## 2019-05-17 LAB — HIV-1 RNA QUANT-NO REFLEX-BLD
HIV 1 RNA Quant: 24 copies/mL — ABNORMAL HIGH
HIV-1 RNA Quant, Log: 1.38 Log copies/mL — ABNORMAL HIGH

## 2019-05-17 LAB — TSH: TSH: 4.06 mIU/L (ref 0.40–4.50)

## 2019-05-17 NOTE — Telephone Encounter (Signed)
Patient made aware of labs. Valarie Cones

## 2019-05-17 NOTE — Telephone Encounter (Signed)
-----   Message from Veryl Speak, FNP sent at 05/17/2019 10:50 AM EST ----- Please inform Douglas Conway that his blood work shows that his viral load is undectable and CD4 count was not able to be completed. His thyroid function, kidney function, electrolytes, and liver function look good. Continue with medication and follow up as planned.

## 2019-05-21 NOTE — Progress Notes (Signed)
Virtual Visit via Video Note   This visit type was conducted due to national recommendations for restrictions regarding the COVID-19 Pandemic (e.g. social distancing) in an effort to limit this patient's exposure and mitigate transmission in our community.  Due to his co-morbid illnesses, this patient is at least at moderate risk for complications without adequate follow up.  This format is felt to be most appropriate for this patient at this time.  All issues noted in this document were discussed and addressed.  A limited physical exam was performed with this format.  Please refer to the patient's chart for his consent to telehealth for [facility].   Date:  05/23/2019   ID:  Douglas Conway, DOB [birth year], MRN [id number]  Patient Location: Home Provider Location: Home  PCP:  System, Pcp Not In  Cardiologist: Tobias Alexander, MD  Evaluation Performed:  New Patient Evaluation  Reason for visit: Chronic systolic CHF  History of Present Illness:    Douglas Conway is a 64 y.o. male with prior medical history of ischemic cardiomyopathy, we currently do not have any records however based on his description it appears that at the time the patient was incarcerated, he develops severe acute onset chest pain and it took over 30 hours to take him to the hospital where he was diagnosed with STEMI in LAD distribution, stent was placed in December 2006 at [facility].  At the time his LVEF was 25%, however with medical therapy it improved to over 35% and he has never received an ICD.  The patient followed cardiologist in [place] but has not seen one in at least 5 years.  He states that he has been feeling well he runs 3 miles 3-4 times a week without any chest pain or shortness of breath, he denies any palpitations no dizziness or syncope, no lower extremity edema orthopnea or proximal nocturnal dyspnea.  He ran out of medications 3 months ago, however he states that he has not been having any symptoms whatsoever  since then.  He is allergic to Plavix with rash, he is a lifelong smoker but lately only smokes sporadically, previously pack a day.  He also has a history of hyperthyroidism that was diagnosed at the time of STEMI.  He is currently applying for insurance and cannot afford his medications.  The patient does not have symptoms concerning for COVID-19 infection (fever, chills, cough, or new shortness of breath).   Past Medical History:  Diagnosis Date  . 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 Chronic systolic congestive heart failure (HCC) 05/14/2019  . Coronary artery disease   . HIV (human immunodeficiency virus infection) (HCC)   . HIV disease (HCC) 06/28/2018  . Hypertension 06/28/2018  . Lateral epicondylitis 01/07/2019  . Other specified hypothyroidism 01/07/2019  . Thyroid disease    Past Surgical History:  Procedure Laterality Date  . CORONARY ANGIOPLASTY WITH STENT PLACEMENT      Current Meds  Medication Sig  . acyclovir (ZOVIRAX) 400 MG tablet Take 1 tablet (400 mg total) by mouth 5 (five) times daily.  . bictegravir-emtricitabine-tenofovir AF (BIKTARVY) 50-200-25 MG TABS tablet Take 1 tablet by mouth daily.  Marland Kitchen levothyroxine (SYNTHROID) 150 MCG tablet Take 1 tablet (150 mcg total) by mouth daily.    Allergies:   Plavix [clopidogrel bisulfate]   Social History   Tobacco Use  . Smoking status: Current Some Day Smoker    Packs/day: 0.10    Start date: 01/07/1968  . Smokeless tobacco: Never Used  .  Tobacco comment: cutting back  Substance Use Topics  . Alcohol use: Yes    Alcohol/week: 12.0 standard drinks    Types: 12 Cans of beer per week  . Drug use: Never     Family Hx: The patient's family history is not on file.  ROS:   Please see the history of present illness.    All other systems reviewed and are negative.   Prior CV studies:   The following studies were reviewed today:  Labs/Other Tests and Data Reviewed:    EKG:  No ECG reviewed.  Recent  Labs: 01/07/2019: Hemoglobin 14.4; Platelets 319 05/14/2019: ALT 43; BUN 22; Creat 0.94; Potassium 4.4; Sodium 137; TSH 4.06   Recent Lipid Panel Lab Results  Component Value Date/Time   CHOL 225 (H) 06/13/2018 02:08 PM   TRIG 452 (H) 06/13/2018 02:08 PM   HDL 30 (L) 06/13/2018 02:08 PM   CHOLHDL 7.5 (H) 06/13/2018 02:08 PM   LDLCALC  06/13/2018 02:08 PM     Comment:     . LDL cholesterol not calculated. Triglyceride levels greater than 400 mg/dL invalidate calculated LDL results. . Reference range: <100 . Desirable range <100 mg/dL for primary prevention;   <70 mg/dL for patients with CHD or diabetic patients  with > or = 2 CHD risk factors. Marland Kitchen LDL-C is now calculated using the Martin-Hopkins  calculation, which is a validated novel method providing  better accuracy than the Friedewald equation in the  estimation of LDL-C.  Horald Pollen et al. Lenox Ahr. 4401;027(25): 2061-2068  (http://education.[employer].com/faq/FAQ164)     Wt Readings from Last 3 Encounters:  05/23/19 190 lb (86.2 kg)  05/14/19 192 lb (87.1 kg)  02/04/19 185 lb (83.9 kg)     Objective:    Vital Signs:  BP 117/80   Pulse 60   Wt 190 lb (86.2 kg)   BMI 25.07 kg/m    VITAL SIGNS:  reviewed  ASSESSMENT & PLAN:    1. CAD history of STEMI in 2006 with stenting to LAD, we will restart aspirin 81 mg daily, Coreg, lisinopril atorvastatin 80 mg daily, we have to obtain the dosages from his prior cardiologist.  I would start low-dose lisinopril 5 mg daily and Coreg 3.125 mg p.o. twice daily.  I would want to see him as soon as possible in the clinic to evaluate his vital signs, obtain his echocardiogram to reevaluate his LVEF, once he obtains his insurance potentially switch him to [place].  We will also obtain his labs including CMP, CBC, lipids, TSH and vitamin D. 2. Ischemic cardiomyopathy -post STEMI 25% improved to about 35%, will obtain new echocardiogram. 3. Hypertension -not evaluated today as he does  not have a blood pressure cuff we will obtain. 4. Hyperlipidemia -restart atorvastatin 80 mg daily. 5. Smoking -cessation advised.  COVID-19 Education: The signs and symptoms of COVID-19 were discussed with the patient and how to seek care for testing (follow up with PCP or arrange E-visit).  The importance of social distancing was discussed today.  Time:   Today, I have spent 30 minutes with the patient with telehealth technology discussing the above problems.     Medication Adjustments/Labs and Tests Ordered: Current medicines are reviewed at length with the patient today.  Concerns regarding medicines are outlined above.   Tests Ordered: No orders of the defined types were placed in this encounter.   Medication Changes: No orders of the defined types were placed in this encounter.   Follow Up:  In Person  in 2 month(s)  Signed, Ena Dawley, MD  05/23/2019 10:28 AM    [place]

## 2019-05-23 ENCOUNTER — Other Ambulatory Visit: Payer: Self-pay

## 2019-05-23 ENCOUNTER — Encounter: Payer: Self-pay | Admitting: Cardiology

## 2019-05-23 ENCOUNTER — Telehealth (INDEPENDENT_AMBULATORY_CARE_PROVIDER_SITE_OTHER): Payer: Medicaid Other | Admitting: Cardiology

## 2019-05-23 ENCOUNTER — Telehealth: Payer: Self-pay | Admitting: *Deleted

## 2019-05-23 VITALS — BP 117/80 | HR 60 | Wt 190.0 lb

## 2019-05-23 DIAGNOSIS — I255 Ischemic cardiomyopathy: Secondary | ICD-10-CM

## 2019-05-23 DIAGNOSIS — Z720 Tobacco use: Secondary | ICD-10-CM

## 2019-05-23 DIAGNOSIS — I252 Old myocardial infarction: Secondary | ICD-10-CM

## 2019-05-23 DIAGNOSIS — I10 Essential (primary) hypertension: Secondary | ICD-10-CM | POA: Diagnosis not present

## 2019-05-23 DIAGNOSIS — E782 Mixed hyperlipidemia: Secondary | ICD-10-CM

## 2019-05-23 DIAGNOSIS — I5022 Chronic systolic (congestive) heart failure: Secondary | ICD-10-CM

## 2019-05-23 DIAGNOSIS — Z79899 Other long term (current) drug therapy: Secondary | ICD-10-CM

## 2019-05-23 DIAGNOSIS — Z955 Presence of coronary angioplasty implant and graft: Secondary | ICD-10-CM

## 2019-05-23 DIAGNOSIS — E785 Hyperlipidemia, unspecified: Secondary | ICD-10-CM

## 2019-05-23 DIAGNOSIS — I2510 Atherosclerotic heart disease of native coronary artery without angina pectoris: Secondary | ICD-10-CM

## 2019-05-23 MED ORDER — CARVEDILOL 6.25 MG PO TABS: 6.2500 mg | ORAL_TABLET | Freq: Two times a day (BID) | ORAL | 2 refills | Status: DC

## 2019-05-23 MED ORDER — LISINOPRIL 10 MG PO TABS: 10.0000 mg | ORAL_TABLET | Freq: Every day | ORAL | 2 refills | Status: DC

## 2019-05-23 MED ORDER — PRAVASTATIN SODIUM 40 MG PO TABS: 40.0000 mg | ORAL_TABLET | Freq: Every day | ORAL | 2 refills | Status: DC

## 2019-05-23 MED ORDER — DIGOXIN 125 MCG PO TABS: 125.0000 ug | ORAL_TABLET | Freq: Every day | ORAL | 4 refills | Status: DC

## 2019-05-23 NOTE — Telephone Encounter (Signed)
-----   Message from Pricilla Holm sent at 05/23/2019 12:18 PM EST ----- Regarding: RE: Pt needs echo and lab same day per Dr. Delton See Was left a message to call and schedule  ----- Message ----- From: Loa Socks, LPN Sent: 0/87/1994  10:35 AM EST To: [facility] Subject: Pt needs echo and lab same day per Dr. Delton See  Dr. Delton See did a virtual on this pt this morning and he will need to be scheduled for an echo and lab SAME DAY.  Both orders are in and the pt is aware that you will be calling to arrange this appt.  Can you please call and schedule, then shoot me the dates?  Thanks for all you do, Lajoyce Corners

## 2019-05-23 NOTE — Patient Instructions (Signed)
Medication Instructions:   Your physician recommends that you continue on your current medications as directed. Please refer to the Current Medication list given to you today.  *If you need a refill on your cardiac medications before your next appointment, please call your pharmacy*    Lab Work:  SAME DAY AS YOUR ECHO WILL BE SCHEDULED--OUR SCHEDULERS WILL CALL YOU SOON TO ARRANGE YOUR LAB APPOINTMENT AND ECHO SAME DAY--WE WILL CHECK CMET, CBC, TSH, VITAMIN D LEVEL, AND LIPIDS--PLEASE COME FASTING TO THIS LAB APPOINTMENT  If you have labs (blood work) drawn today and your tests are completely normal, you will receive your results only by: Marland Kitchen MyChart Message (if you have MyChart) OR . A paper copy in the mail If you have any lab test that is abnormal or we need to change your treatment, we will call you to review the results.    Testing/Procedures:  Your physician has requested that you have an echocardiogram. Echocardiography is a painless test that uses sound waves to create images of your heart. It provides your doctor with information about the size and shape of your heart and how well your heart's chambers and valves are working. This procedure takes approximately one hour. There are no restrictions for this procedure. OUR SCHEDULING DEPT WILL CALL YOU SOON TO ARRANGE THIS APPOINTMENT AS WELL AS YOUR LAB SAME DAY.     Follow-Up: At [facility], you and your health needs are our priority.  As part of our continuing mission to provide you with exceptional heart care, we have created designated Provider Care Teams.  These Care Teams include your primary Cardiologist (physician) and Advanced Practice Providers (APPs -  Physician Assistants and Nurse Practitioners) who all work together to provide you with the care you need, when you need it.  Your next appointment:   3 MONTHS IN PERSON WITH DR. Delton See IN THE OFFICE ON Aug 21, 2019 AT 9:20 AM   The format for your next appointment:    In Person  Provider:   Tobias Alexander, MD

## 2019-05-23 NOTE — Telephone Encounter (Signed)
Virtual Visit Pre-Appointment Phone Call  "(Name), I am calling you today to discuss your upcoming appointment. We are currently trying to limit exposure to the virus that causes COVID-19 by seeing patients at home rather than in the office."  1. "What is the BEST phone number to call the day of the visit?" - include this in appointment notes-yes updated  2. "Do you have or have access to (through a family member/friend) a smartphone with video capability that we can use for your visit?"  a. If no - list the appointment type as a PHONE visit in appointment notes-yes updated  3. Confirm consent - "In the setting of the current Covid19 crisis, you are scheduled for a (phone or video) visit with your provider on (date) at (time).  Just as we do with many in-office visits, in order for you to participate in this visit, we must obtain consent.  If you'd like, I can send this to your mychart (if signed up) or email for you to review.  Otherwise, I can obtain your verbal consent now.  All virtual visits are billed to your insurance company just like a normal visit would be.  By agreeing to a virtual visit, we'd like you to understand that the technology does not allow for your provider to perform an examination, and thus may limit your provider's ability to fully assess your condition. If your provider identifies any concerns that need to be evaluated in person, we will make arrangements to do so.  Finally, though the technology is pretty good, we cannot assure that it will always work on either your or our end, and in the setting of a video visit, we may have to convert it to a phone-only visit.  In either situation, we cannot ensure that we have a secure connection.  Are you willing to proceed?" STAFF: Did the patient verbally acknowledge consent to telehealth visit? Document YES/NO here: PT GAVE VERBAL CONSENT FOR DR. Delton See TO TREAT HIM VIA TELEPHONE VISIT TODAY 05/23/19 AT 1000.  4. Advise patient to  be prepared - "Two hours prior to your appointment, go ahead and check your blood pressure, pulse, oxygen saturation, and your weight (if you have the equipment to check those) and write them all down. When your visit starts, your provider will ask you for this information. If you have an Apple Watch or Kardia device, please plan to have heart rate information ready on the day of your appointment. Please have a pen and paper handy nearby the day of the visit as well."-YES AWARE   5. Inform patient they will receive a phone call 15 minutes prior to their appointment time (may be from unknown caller ID) so they should be prepared to answer    TELEPHONE CALL NOTE  Douglas Conway has been deemed a candidate for a follow-up tele-health visit to limit community exposure during the Covid-19 pandemic. I spoke with the patient via phone to ensure availability of phone/video source, confirm preferred email & phone number, and discuss instructions and expectations.  I reminded Douglas Conway to be prepared with any vital sign and/or heart rhythm information that could potentially be obtained via home monitoring, at the time of his visit. I reminded Douglas Conway to expect a phone call prior to his visit.  Douglas Socks, LPN 7/42/5956 3:87 AM       FULL LENGTH CONSENT FOR TELE-HEALTH VISIT   I hereby voluntarily request, consent and authorize [facility] and its employed or  contracted physicians, physician assistants, nurse practitioners or other licensed health care professionals (the Practitioner), to provide me with telemedicine health care services (the "Services") as deemed necessary by the treating Practitioner. I acknowledge and consent to receive the Services by the Practitioner via telemedicine. I understand that the telemedicine visit will involve communicating with the Practitioner through live audiovisual communication technology and the disclosure of certain medical information by electronic  transmission. I acknowledge that I have been given the opportunity to request an in-person assessment or other available alternative prior to the telemedicine visit and am voluntarily participating in the telemedicine visit.  I understand that I have the right to withhold or withdraw my consent to the use of telemedicine in the course of my care at any time, without affecting my right to future care or treatment, and that the Practitioner or I may terminate the telemedicine visit at any time. I understand that I have the right to inspect all information obtained and/or recorded in the course of the telemedicine visit and may receive copies of available information for a reasonable fee.  I understand that some of the potential risks of receiving the Services via telemedicine include:  Marland Kitchen Delay or interruption in medical evaluation due to technological equipment failure or disruption; . Information transmitted may not be sufficient (e.g. poor resolution of images) to allow for appropriate medical decision making by the Practitioner; and/or  . In rare instances, security protocols could fail, causing a breach of personal health information.  Furthermore, I acknowledge that it is my responsibility to provide information about my medical history, conditions and care that is complete and accurate to the best of my ability. I acknowledge that Practitioner's advice, recommendations, and/or decision may be based on factors not within their control, such as incomplete or inaccurate data provided by me or distortions of diagnostic images or specimens that may result from electronic transmissions. I understand that the practice of medicine is not an exact science and that Practitioner makes no warranties or guarantees regarding treatment outcomes. I acknowledge that I will receive a copy of this consent concurrently upon execution via email to the email address I last provided but may also request a printed copy by  calling the office of [facility].    I understand that my insurance will be billed for this visit.   I have read or had this consent read to me. . I understand the contents of this consent, which adequately explains the benefits and risks of the Services being provided via telemedicine.  . I have been provided ample opportunity to ask questions regarding this consent and the Services and have had my questions answered to my satisfaction. . I give my informed consent for the services to be provided through the use of telemedicine in my medical care  By participating in this telemedicine visit I agree to the above.  PT GAVE VERBAL CONSENT FOR DR. Meda Coffee TO TREAT HIM VIA TELEPHONE VISIT TODAY 05/23/19 AT 10 AM.

## 2019-06-12 ENCOUNTER — Other Ambulatory Visit: Payer: Self-pay

## 2019-06-12 ENCOUNTER — Other Ambulatory Visit: Admitting: *Deleted

## 2019-06-12 ENCOUNTER — Ambulatory Visit (HOSPITAL_COMMUNITY): Payer: Medicaid Other | Attending: Cardiovascular Disease

## 2019-06-12 DIAGNOSIS — E782 Mixed hyperlipidemia: Secondary | ICD-10-CM

## 2019-06-12 DIAGNOSIS — I10 Essential (primary) hypertension: Secondary | ICD-10-CM | POA: Insufficient documentation

## 2019-06-12 DIAGNOSIS — I255 Ischemic cardiomyopathy: Secondary | ICD-10-CM | POA: Insufficient documentation

## 2019-06-12 DIAGNOSIS — I2510 Atherosclerotic heart disease of native coronary artery without angina pectoris: Secondary | ICD-10-CM

## 2019-06-12 DIAGNOSIS — Z79899 Other long term (current) drug therapy: Secondary | ICD-10-CM | POA: Insufficient documentation

## 2019-06-12 DIAGNOSIS — I5022 Chronic systolic (congestive) heart failure: Secondary | ICD-10-CM

## 2019-06-13 ENCOUNTER — Telehealth: Payer: Self-pay | Admitting: *Deleted

## 2019-06-13 LAB — COMPREHENSIVE METABOLIC PANEL
ALT: 57 IU/L — ABNORMAL HIGH (ref 0–44)
AST: 49 IU/L — ABNORMAL HIGH (ref 0–40)
Albumin/Globulin Ratio: 1.6 (ref 1.2–2.2)
Albumin: 4.4 g/dL (ref 3.8–4.8)
Alkaline Phosphatase: 98 IU/L (ref 39–117)
BUN/Creatinine Ratio: 18 (ref 10–24)
BUN: 16 mg/dL (ref 8–27)
Bilirubin Total: 0.4 mg/dL (ref 0.0–1.2)
CO2: 23 mmol/L (ref 20–29)
Calcium: 9.4 mg/dL (ref 8.6–10.2)
Chloride: 101 mmol/L (ref 96–106)
Creatinine, Ser: 0.87 mg/dL (ref 0.76–1.27)
GFR calc Af Amer: 108 mL/min/{1.73_m2} (ref >59)
GFR calc non Af Amer: 93 mL/min/{1.73_m2} (ref >59)
Globulin, Total: 2.8 g/dL (ref 1.5–4.5)
Glucose: 88 mg/dL (ref 65–99)
Potassium: 4.6 mmol/L (ref 3.5–5.2)
Sodium: 138 mmol/L (ref 134–144)
Total Protein: 7.2 g/dL (ref 6.0–8.5)

## 2019-06-13 LAB — TSH: TSH: 7.3 u[IU]/mL — ABNORMAL HIGH (ref 0.450–4.500)

## 2019-06-13 LAB — CBC
Hematocrit: 43.2 % (ref 37.5–51.0)
Hemoglobin: 15.3 g/dL (ref 13.0–17.7)
MCH: 31.7 pg (ref 26.6–33.0)
MCHC: 35.4 g/dL (ref 31.5–35.7)
MCV: 90 fL (ref 79–97)
Platelets: 346 10*3/uL (ref 150–450)
RBC: 4.82 x10E6/uL (ref 4.14–5.80)
RDW: 12.4 % (ref 11.6–15.4)
WBC: 8.9 10*3/uL (ref 3.4–10.8)

## 2019-06-13 LAB — LIPID PANEL
Chol/HDL Ratio: 3.4 ratio (ref 0.0–5.0)
Cholesterol, Total: 196 mg/dL (ref 100–199)
HDL: 57 mg/dL (ref >39)
LDL Chol Calc (NIH): 108 mg/dL — ABNORMAL HIGH (ref 0–99)
Triglycerides: 182 mg/dL — ABNORMAL HIGH (ref 0–149)
VLDL Cholesterol Cal: 31 mg/dL (ref 5–40)

## 2019-06-13 LAB — VITAMIN D 25 HYDROXY (VIT D DEFICIENCY, FRACTURES): Vit D, 25-Hydroxy: 25.4 ng/mL — ABNORMAL LOW (ref 30.0–100.0)

## 2019-06-13 MED ORDER — LEVOTHYROXINE SODIUM 175 MCG PO TABS: 175.0000 ug | ORAL_TABLET | Freq: Every day | ORAL | 1 refills | Status: DC

## 2019-06-13 MED ORDER — ROSUVASTATIN CALCIUM 20 MG PO TABS: 20.0000 mg | ORAL_TABLET | Freq: Every day | ORAL | 1 refills | Status: DC

## 2019-06-13 MED ORDER — CHOLECALCIFEROL 125 MCG (5000 UT) PO CAPS: 5000.0000 [IU] | ORAL_CAPSULE | Freq: Every day | ORAL | 1 refills | Status: DC

## 2019-06-13 NOTE — Telephone Encounter (Signed)
-----   Message from Lars Masson, MD sent at 06/13/2019 12:16 PM EST ----- Low vitamin D, at 5000 units a day, elevated LDL if he can tolerate I will change pravastatin to rosuvastatin 20 mg daily, TSH high I would increase levothyroxine 275 mcg a day.

## 2019-06-13 NOTE — Addendum Note (Signed)
Addended by: Loa Socks on: 06/13/2019 05:05 PM   Modules accepted: Orders

## 2019-06-13 NOTE — Telephone Encounter (Signed)
Dr. Delton See, spoke with the pt and informed him of lab results and recommendations you suggested.  Pt states he does not have insurance yet, and is calling Medicaid tomorrow, to work on getting his assistance approved through [place] instead, for he is working with Sports coach at [employer] for this.  Pt was ok starting Vitamin D 5,000 units po daily.  Called that into [facility] in [place].  Pt did state he HAS NOT taken his Pravastatin at all in well over 3 months, which he states "is why my lipids were elevated." Pt wants to know should he restart back taking pravastatin 40 mg po daily, when his insurance kicks in, or do you still want him to change over to rosuvastatin 20 mg po daily? Also when endorsing to the pt that his TSH level is high, he stated he has not had his levothyroxine in over 3 months as well, and just started back taking this medication exactly ONE WEEK ago.  Pt states he prefers that you give him time to adjust to his current dose of levothyroxine 150 mcg daily and get his levels adjusted with that dose, being he has only been taking this for one week.  Informed the pt that I would need to clarify with you which statin you would like for him to take and if it is ok for him to continue with levothyroxine 150 mcg po daily, or increase as you advised. He is aware that I will call him back, once you respond.  Please advise! Thanks.

## 2019-06-13 NOTE — Telephone Encounter (Signed)
Douglas Masson, MD  Loa Socks, LPN  Rosuvastatin 20 mg po daily and levothyroxine 175 mcg daily

## 2019-06-13 NOTE — Telephone Encounter (Signed)
Spoke back with the pt and informed him that per Dr. Delton See, she does want him to stop his pravastatin, and switch to rosuvastatin 20 mg po daily, and we need to increase his levothyroxine to 175 mcg po daily.  Confirmed the pharmacy of choice with the pt.  Still advised the pt to follow-up with Medicaid tomorrow, so that he can get his pt assistance through [facility] going. Informed the pt that he will follow-up as planned with Georgie Chard NP on next Monday 3/8 at 1045, to further discuss treatment plan for his echo results. Pt verbalized understanding and agrees with this plan.

## 2019-06-13 NOTE — Telephone Encounter (Signed)
-----   Message from Lars Masson, MD sent at 06/13/2019  4:52 PM EST ----- Rosuvastatin 20 mg po daily and levothyroxine 175 mcg daily ----- Message ----- From: Loa Socks, LPN Sent: 04/14/863   3:58 PM EST To: Lars Masson, MD  Review and advise on which statin pt should be taking and advise on levothyroxine dose.  Please see phone note I just sent about this. Thanks  ----- Message ----- From: Lars Masson, MD Sent: 06/13/2019  12:16 PM EST To: Loa Socks, LPN  Low vitamin D, at 5000 units a day, elevated LDL if he can tolerate I will change pravastatin to rosuvastatin 20 mg daily, TSH high I would increase levothyroxine 275 mcg a day.

## 2019-06-15 NOTE — Progress Notes (Deleted)
Cardiology Office Note   Date:  06/15/2019   ID:  Douglas Conway, DOB Dec 15, 1955, MRN [id number]  PCP:  System, Pcp Not In  Cardiologist:  Dr. Delton See  No chief complaint on file.     History of Present Illness: Douglas Conway is a 64 y.o. male who presents for [facility] start, seen for Dr. Delton See.   Douglas Conway has a prior medical history of ischemic cardiomyopathy with no available records for review however based on his description it appears that at the time the patient was incarcerated, he developed severe acute onset chest pain and it took over 30 hours to take him to the hospital where he was diagnosed with STEMI in LAD distribution, stent was placed in December 2006 at [facility].  At the time his LVEF was 25%, however with medical therapy it improved to over 35% and he has never received an ICD.  The patient followed cardiologist in [place] but has not seen one in at least 5 years.  He stated that he had been feeling well, running 3 miles a week without any chest pain or shortness of breath.  At last office visit, he reported that he had run out of medications 3 months ago, however he states that he has not been having any symptoms whatsoever since then.  He is allergic to Plavix with rash, he is a lifelong smoker but lately only smokes sporadically. He also has a history of hyperthyroidism that was diagnosed at the time of STEMI.  He is currently applying for insurance and cannot afford his medications.  Repeat echocardiogram performed 07/09/2019 which showed an EF of 30 to 35% with plans for close in person follow-up to discuss Entresto initiation.  Unfortunately, there has been issues with insurance coverage.  Per last phone note, patient does not have Medicaid however is working with a Sports coach at [facility] to help with medication assistance.  1.  CAD with history of STEMI in 2006 with PCI to LAD: -ASA 81, carvedilol, lisinopril, atorvastatin were restarted at last OV along with low-dose  lisinopril 5 mg -An echocardiogram was obtained given no prior records for our review -   2.  Ischemic cardiomyopathy: -Post STEMI with initial EF approximately 25% with improvement to 35% -Echocardiogram performed   3.  Hypertension: -No cuff available at last telemedicine visit -BP today, -Continue  4.  Hyperlipidemia: -LDL, -Atorvastatin 80 mg recently reinitiated  5.  Tobacco use: -Cessation strongly encouraged  Past Medical History:  Diagnosis Date  . 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 Chronic systolic congestive heart failure (HCC) 05/14/2019  . Coronary artery disease   . HIV (human immunodeficiency virus infection) (HCC)   . HIV disease (HCC) 06/28/2018  . Hypertension 06/28/2018  . Lateral epicondylitis 01/07/2019  . Other specified hypothyroidism 01/07/2019  . Thyroid disease     Past Surgical History:  Procedure Laterality Date  . CORONARY ANGIOPLASTY WITH STENT PLACEMENT       Current Outpatient Medications  Medication Sig Dispense Refill  . acyclovir (ZOVIRAX) 400 MG tablet Take 1 tablet (400 mg total) by mouth 5 (five) times daily. 60 tablet 3  . bictegravir-emtricitabine-tenofovir AF (BIKTARVY) 50-200-25 MG TABS tablet Take 1 tablet by mouth daily. 30 tablet 1  . carvedilol (COREG) 6.25 MG tablet Take 1 tablet (6.25 mg total) by mouth 2 (two) times daily. 180 tablet 2  . Cholecalciferol 125 MCG (5000 UT) capsule Take 1 capsule (5,000 Units total) by mouth daily. 90 capsule  1  . digoxin (LANOXIN) 0.125 MG tablet Take 1 tablet (125 mcg total) by mouth daily. 30 tablet 4  . levothyroxine (SYNTHROID) 175 MCG tablet Take 1 tablet (175 mcg total) by mouth daily before breakfast. 90 tablet 1  . lisinopril (ZESTRIL) 10 MG tablet Take 1 tablet (10 mg total) by mouth daily. 90 tablet 2  . rosuvastatin (CRESTOR) 20 MG tablet Take 1 tablet (20 mg total) by mouth daily. 90 tablet 1   No current facility-administered medications for this visit.    Allergies:    Plavix [clopidogrel bisulfate]    Social History:  The patient  reports that he has been smoking. He started smoking about 51 years ago. He has been smoking about 0.10 packs per day. He has never used smokeless tobacco. He reports current alcohol use of about 12.0 standard drinks of alcohol per week. He reports that he does not use drugs.   Family History:  The patient's family history is not on file.    ROS:  Please see the history of present illness. Otherwise, review of systems are positive for none.   All other systems are reviewed and negative.    PHYSICAL EXAM: VS:  There were no vitals taken for this visit. , BMI There is no height or weight on file to calculate BMI.   General: Well developed, well nourished, NAD Skin: Warm, dry, intact  Head: Normocephalic, atraumatic, sclera non-icteric, no xanthomas, clear, moist mucus membranes. Neck: Negative for carotid bruits. No JVD Lungs:Clear to ausculation bilaterally. No wheezes, rales, or rhonchi. Breathing is unlabored. Cardiovascular: RRR with S1 S2. No murmurs, rubs, gallops, or LV heave appreciated. Abdomen: Soft, non-tender, non-distended with normoactive bowel sounds. No hepatomegaly, No rebound/guarding. No obvious abdominal masses. MSK: Strength and tone appear normal for age. 5/5 in all extremities Extremities: No edema. No clubbing or cyanosis. DP/PT pulses 2+ bilaterally Neuro: Alert and oriented. No focal deficits. No facial asymmetry. MAE spontaneously. Psych: Responds to questions appropriately with normal affect.       EKG:  EKG {ACTION; IS/IS BOF:75102585} ordered today. The ekg ordered today demonstrates ***   Recent Labs: 06/12/2019: ALT 57; BUN 16; Creatinine, Ser 0.87; Hemoglobin 15.3; Platelets 346; Potassium 4.6; Sodium 138; TSH 7.300    Lipid Panel    Component Value Date/Time   CHOL 196 06/12/2019 1053   TRIG 182 (H) 06/12/2019 1053   HDL 57 06/12/2019 1053   CHOLHDL 3.4 06/12/2019 1053   CHOLHDL  7.5 (H) 06/13/2018 1408   LDLCALC 108 (H) 06/12/2019 1053   LDLCALC  06/13/2018 1408     Comment:     . LDL cholesterol not calculated. Triglyceride levels greater than 400 mg/dL invalidate calculated LDL results. . Reference range: <100 . Desirable range <100 mg/dL for primary prevention;   <70 mg/dL for patients with CHD or diabetic patients  with > or = 2 CHD risk factors. Marland Kitchen LDL-C is now calculated using the Martin-Hopkins  calculation, which is a validated novel method providing  better accuracy than the Friedewald equation in the  estimation of LDL-C.  Horald Pollen et al. Lenox Ahr. 2778;242(35): 2061-2068  (http://education.[employer].com/faq/FAQ164)       Wt Readings from Last 3 Encounters:  05/23/19 190 lb (86.2 kg)  05/14/19 192 lb (87.1 kg)  02/04/19 185 lb (83.9 kg)      Other studies Reviewed: Additional studies/ records that were reviewed today include: ***. Review of the above records demonstrates: ***  Echocardiogram 06/12/2019:  1. Left ventricular ejection fraction,  by estimation, is 30 to 35%. The  left ventricle has moderately decreased function. The left ventricle  demonstrates global hypokinesis. The left ventricular internal cavity size  was moderately dilated. There is mild  left ventricular hypertrophy. Left ventricular diastolic parameters are  consistent with Grade I diastolic dysfunction (impaired relaxation).  2. Right ventricular systolic function is normal. The right ventricular  size is normal. There is moderately elevated pulmonary artery systolic  pressure.  3. Left atrial size was mildly dilated.  4. The mitral valve is normal in structure and function. Trivial mitral  valve regurgitation. No evidence of mitral stenosis.  5. The aortic valve is tricuspid. Aortic valve regurgitation is trivial.  No aortic stenosis is present.  6. Aortic dilatation noted. 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  7. The  inferior vena cava is dilated in size with >50% respiratory  variability, suggesting right atrial pressure of 8 mmHg.   ASSESSMENT AND PLAN:  1.  ***   Current medicines are reviewed at length with the patient today.  The patient {ACTIONS; HAS/DOES NOT HAVE:19233} concerns regarding medicines.  The following changes have been made:  {PLAN; NO CHANGE:13088:s}  Labs/ tests ordered today include: *** No orders of the defined types were placed in this encounter.    Disposition:   FU with *** in {gen number 4-70:962836} {Days to years:10300}  Signed, Kathyrn Drown, NP  06/15/2019 12:05 PM    [facility] [place], [place],   [address] Phone: [phone]; Fax: [phone]

## 2019-06-17 ENCOUNTER — Ambulatory Visit: Payer: Self-pay | Admitting: Cardiology

## 2019-07-02 ENCOUNTER — Other Ambulatory Visit: Payer: Self-pay

## 2019-07-02 DIAGNOSIS — B20 Human immunodeficiency virus [HIV] disease: Secondary | ICD-10-CM

## 2019-07-02 MED ORDER — BIKTARVY 50-200-25 MG PO TABS: 1.0000 | ORAL_TABLET | Freq: Every day | ORAL | 3 refills | Status: DC

## 2019-07-04 ENCOUNTER — Other Ambulatory Visit: Payer: Self-pay | Admitting: Family

## 2019-07-04 DIAGNOSIS — B20 Human immunodeficiency virus [HIV] disease: Secondary | ICD-10-CM

## 2019-08-05 ENCOUNTER — Ambulatory Visit: Admitting: Family

## 2019-08-08 ENCOUNTER — Ambulatory Visit: Payer: Medicaid Other | Admitting: Family

## 2019-08-18 ENCOUNTER — Other Ambulatory Visit: Payer: Self-pay

## 2019-08-18 ENCOUNTER — Emergency Department: Payer: Medicaid Other

## 2019-08-18 ENCOUNTER — Inpatient Hospital Stay
Admission: EM | Admit: 2019-08-18 | Discharge: 2019-08-20 | DRG: 153 | Discharge status: Against Medical Advice | Payer: Medicaid Other | Attending: Internal Medicine | Admitting: Internal Medicine

## 2019-08-18 DIAGNOSIS — M5442 Lumbago with sciatica, left side: Secondary | ICD-10-CM | POA: Diagnosis present

## 2019-08-18 DIAGNOSIS — H70009 Acute mastoiditis without complications, unspecified ear: Secondary | ICD-10-CM | POA: Diagnosis present

## 2019-08-18 DIAGNOSIS — B20 Human immunodeficiency virus [HIV] disease: Secondary | ICD-10-CM | POA: Diagnosis present

## 2019-08-18 DIAGNOSIS — Z20822 Contact with and (suspected) exposure to covid-19: Secondary | ICD-10-CM | POA: Diagnosis present

## 2019-08-18 DIAGNOSIS — H70001 Acute mastoiditis without complications, right ear: Principal | ICD-10-CM | POA: Diagnosis present

## 2019-08-18 DIAGNOSIS — Z79899 Other long term (current) drug therapy: Secondary | ICD-10-CM

## 2019-08-18 DIAGNOSIS — F17210 Nicotine dependence, cigarettes, uncomplicated: Secondary | ICD-10-CM | POA: Diagnosis present

## 2019-08-18 DIAGNOSIS — I160 Hypertensive urgency: Secondary | ICD-10-CM | POA: Diagnosis present

## 2019-08-18 DIAGNOSIS — I110 Hypertensive heart disease with heart failure: Secondary | ICD-10-CM | POA: Diagnosis present

## 2019-08-18 DIAGNOSIS — I2510 Atherosclerotic heart disease of native coronary artery without angina pectoris: Secondary | ICD-10-CM | POA: Diagnosis present

## 2019-08-18 DIAGNOSIS — E039 Hypothyroidism, unspecified: Secondary | ICD-10-CM | POA: Diagnosis present

## 2019-08-18 DIAGNOSIS — I5022 Chronic systolic (congestive) heart failure: Secondary | ICD-10-CM | POA: Diagnosis present

## 2019-08-18 DIAGNOSIS — E785 Hyperlipidemia, unspecified: Secondary | ICD-10-CM | POA: Diagnosis present

## 2019-08-18 DIAGNOSIS — R400 Somnolence: Secondary | ICD-10-CM | POA: Diagnosis present

## 2019-08-18 DIAGNOSIS — Z955 Presence of coronary angioplasty implant and graft: Secondary | ICD-10-CM

## 2019-08-18 DIAGNOSIS — G4733 Obstructive sleep apnea (adult) (pediatric): Secondary | ICD-10-CM | POA: Diagnosis present

## 2019-08-18 DIAGNOSIS — R4182 Altered mental status, unspecified: Secondary | ICD-10-CM

## 2019-08-18 DIAGNOSIS — Z79890 Hormone replacement therapy (postmenopausal): Secondary | ICD-10-CM

## 2019-08-18 DIAGNOSIS — H60501 Unspecified acute noninfective otitis externa, right ear: Secondary | ICD-10-CM | POA: Diagnosis present

## 2019-08-18 LAB — CBC WITH DIFFERENTIAL/PLATELET
Abs Immature Granulocytes: 0.03 10*3/uL (ref 0.00–0.07)
Basophils Absolute: 0.1 10*3/uL (ref 0.0–0.1)
Basophils Relative: 1 %
Eosinophils Absolute: 0.2 10*3/uL (ref 0.0–0.5)
Eosinophils Relative: 3 %
HCT: 39.7 % (ref 39.0–52.0)
Hemoglobin: 13.5 g/dL (ref 13.0–17.0)
Immature Granulocytes: 0 %
Lymphocytes Relative: 33 %
Lymphs Abs: 2.9 10*3/uL (ref 0.7–4.0)
MCH: 30.8 pg (ref 26.0–34.0)
MCHC: 34 g/dL (ref 30.0–36.0)
MCV: 90.6 fL (ref 80.0–100.0)
Monocytes Absolute: 0.9 10*3/uL (ref 0.1–1.0)
Monocytes Relative: 10 %
Neutro Abs: 4.6 10*3/uL (ref 1.7–7.7)
Neutrophils Relative %: 53 %
Platelets: 261 10*3/uL (ref 150–400)
RBC: 4.38 MIL/uL (ref 4.22–5.81)
RDW: 12.8 % (ref 11.5–15.5)
WBC: 8.7 10*3/uL (ref 4.0–10.5)
nRBC: 0 % (ref 0.0–0.2)

## 2019-08-18 LAB — COMPREHENSIVE METABOLIC PANEL
ALT: 33 U/L (ref 0–44)
AST: 36 U/L (ref 15–41)
Albumin: 3.9 g/dL (ref 3.5–5.0)
Alkaline Phosphatase: 70 U/L (ref 38–126)
Anion gap: 8 (ref 5–15)
BUN: 21 mg/dL (ref 8–23)
CO2: 25 mmol/L (ref 22–32)
Calcium: 8.9 mg/dL (ref 8.9–10.3)
Chloride: 104 mmol/L (ref 98–111)
Creatinine, Ser: 0.82 mg/dL (ref 0.61–1.24)
GFR calc Af Amer: >60 mL/min (ref >60)
GFR calc non Af Amer: >60 mL/min (ref >60)
Glucose, Bld: 114 mg/dL — ABNORMAL HIGH (ref 70–99)
Potassium: 3.8 mmol/L (ref 3.5–5.1)
Sodium: 137 mmol/L (ref 135–145)
Total Bilirubin: 0.6 mg/dL (ref 0.3–1.2)
Total Protein: 7 g/dL (ref 6.5–8.1)

## 2019-08-18 LAB — ETHANOL: Alcohol, Ethyl (B): <10 mg/dL (ref <10)

## 2019-08-18 LAB — LACTIC ACID, PLASMA: Lactic Acid, Venous: 1.1 mmol/L (ref 0.5–1.9)

## 2019-08-18 LAB — AMMONIA: Ammonia: 44 umol/L — ABNORMAL HIGH (ref 9–35)

## 2019-08-18 LAB — TROPONIN I (HIGH SENSITIVITY): Troponin I (High Sensitivity): 43 ng/L — ABNORMAL HIGH (ref <18)

## 2019-08-18 LAB — GLUCOSE, CAPILLARY: Glucose-Capillary: 118 mg/dL — ABNORMAL HIGH (ref 70–99)

## 2019-08-18 MED ORDER — SODIUM CHLORIDE 0.9 % IV BOLUS
500.0000 mL | Freq: Once | INTRAVENOUS | Status: AC
  Administered 2019-08-19: 500 mL via INTRAVENOUS

## 2019-08-18 NOTE — ED Notes (Signed)
No rash noted. Pt complains of right ear pain, upper back pain, left shoulder and left leg pain. Pt falls asleep during exam frequently, but arouses to loud verbal stiumli.

## 2019-08-18 NOTE — ED Triage Notes (Signed)
Patient reports back pain.  Also reports right ear pain that radiates into jaw.

## 2019-08-18 NOTE — ED Notes (Addendum)
Pt is lethargic and unable to stay awake, answers questions appropriately. Pt states he only drank one beer today. C/o headache and body pains and covers eyes in the light. Pt moves all four extremities equally, is uncoordinated. Per EDP pt will go to CT then main ED. Charge RN notified, report given to April RN.

## 2019-08-18 NOTE — ED Provider Notes (Signed)
[facility] Emergency Department Provider Note  ____________________________________________   First MD Initiated Contact with Patient 08/18/19 2218     (approximate)  I have reviewed the triage vital signs and the nursing notes.   HISTORY  Chief Complaint Back Pain  EM caveat: Patient very poor historian  HPI Douglas Conway is a 64 y.o. male   with past medical history reportedly coronary disease, congestive heart failure HIV.  Initially seen by physician assistant and flex the area of the ER noted to have significantly altered mental status.  He checked in for evaluation of back pain and right ear pain  Patient is very somnolent, resting, but is able to maintain conversation reports that first got a 3-4 days now has been having a lot of right ear pain.  His right ear pain radiates slightly towards his right jaw but not into his mouth.  Is not having a headache.  He reports that he tried putting a solution in there to loosen up wax in the ear but it did not help.  It feels sore and very swollen in his right ear canal.  He reports compliance with his HIV medication.  Denies any fevers or chills.  No chest pain or trouble breathing.  Reports he is also having left lower back pain.  Cannot really tell me how long its been going on but is not associated with any numbness or weakness    Past Medical History:  Diagnosis Date  . 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 Chronic systolic congestive heart failure ([place]) 05/14/2019  . Coronary artery disease   . HIV (human immunodeficiency virus infection) ([place])   . HIV disease ([place]) 06/28/2018  . Hypertension 06/28/2018  . Lateral epicondylitis 01/07/2019  . Other specified hypothyroidism 01/07/2019  . Thyroid disease     Patient Active Problem List   Diagnosis Date Noted  . Chronic systolic congestive heart failure ([place]) 05/14/2019  . Lateral epicondylitis 01/07/2019  . Other specified hypothyroidism 01/07/2019    . HIV disease ([place]) 06/28/2018  . 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 Hypertension 06/28/2018  . Healthcare maintenance 06/28/2018    Past Surgical History:  Procedure Laterality Date  . CORONARY ANGIOPLASTY WITH STENT PLACEMENT      Prior to Admission medications   Medication Sig Start Date End Date Taking? Authorizing Provider  acyclovir (ZOVIRAX) 400 MG tablet Take 1 tablet (400 mg total) by mouth 5 (five) times daily. 05/14/19   Golden Circle, FNP  BIKTARVY [phone] MG TABS tablet TAKE 1 TABLET BY MOUTH DAILY 07/04/19   Golden Circle, FNP  carvedilol (COREG) 6.25 MG tablet Take 1 tablet (6.25 mg total) by mouth 2 (two) times daily. 05/23/19   Dorothy Spark, MD  Cholecalciferol 125 MCG (5000 UT) capsule Take 1 capsule (5,000 Units total) by mouth daily. 06/13/19   Dorothy Spark, MD  digoxin (LANOXIN) 0.125 MG tablet Take 1 tablet (125 mcg total) by mouth daily. 05/23/19   Dorothy Spark, MD  levothyroxine (SYNTHROID) 175 MCG tablet Take 1 tablet (175 mcg total) by mouth daily before breakfast. 06/13/19   Dorothy Spark, MD  lisinopril (ZESTRIL) 10 MG tablet Take 1 tablet (10 mg total) by mouth daily. 05/23/19   Dorothy Spark, MD  rosuvastatin (CRESTOR) 20 MG tablet Take 1 tablet (20 mg total) by mouth daily. 06/13/19   Dorothy Spark, MD    Allergies Plavix [clopidogrel bisulfate]  No family history on file.  Social History Social History   Tobacco Use  . Smoking status: Current Some Day Smoker    Packs/day: 0.10    Start date: 01/07/1968  . Smokeless tobacco: Never Used  . Tobacco comment: cutting back  Substance Use Topics  . Alcohol use: Yes    Alcohol/week: 12.0 standard drinks    Types: 12 Cans of beer per week  . Drug use: Never    Review of Systems Constitutional: No fever/chills Eyes: No visual changes. ENT: No sore throat.  Reports pain in his right ear and some on the right side of his neck.  Radiates towards his right jaw.   Denies pain in his mouth Cardiovascular: Denies chest pain. Respiratory: Denies shortness of breath. Gastrointestinal: No abdominal pain.   Genitourinary: Negative for dysuria. Musculoskeletal: Negative for back pain except in his left lower back. Skin: Negative for rash. Neurological: Negative for headaches, areas of focal weakness or numbness.    ____________________________________________   PHYSICAL EXAM:  VITAL SIGNS: ED Triage Vitals  Enc Vitals Group     BP 08/18/19 2027 127/76     Pulse Rate 08/18/19 2027 (!) 104     Resp 08/18/19 2027 18     Temp 08/18/19 2027 98.6 F (37 C)     Temp Source 08/18/19 2027 Oral     SpO2 08/18/19 2027 95 %     Weight 08/18/19 2025 190 lb (86.2 kg)     Height 08/18/19 2025 6\' 1"  (1.854 m)     Head Circumference --      Peak Flow --      Pain Score 08/18/19 2156 Asleep     Pain Loc --      Pain Edu? --      Excl. in GC? --     Constitutional: Somnolent.  Alerts to voice.  Conversant when focused, but drifts back to sleep quite quickly. Eyes: Conjunctivae are slightly injected bilateral Head: Atraumatic. Nose: No congestion/rhinnorhea. Mouth/Throat: Mucous membranes are moist.  No intraoral lesions.  No dental tenderness.  He is edentulous.  Normal-appearing left ear canal.  Right ear canal is swollen and edematous.  Tender, too tender to advance the otoscope fully without the patient having severe pain. Neck: No stridor.  When resting he does have sonorous respirations. Cardiovascular: Normal rate, regular rhythm. Grossly normal heart sounds.  Good peripheral circulation. Respiratory: Normal respiratory effort.  No retractions. Lungs CTAB. Gastrointestinal: Soft and nontender. No distention. Musculoskeletal: No lower extremity tenderness nor edema. Neurologic: Slightly slurred speech.  Somnolent.  Moves all extremities.  When alert he complains of significant pain in his right ear canal.  He also reports his pain radiates to his  right jaw.  Having some pain in his left lower back and his left leg as well, but primarily reports he has severe pain in the right ear canal. Skin:  Skin is warm, dry and intact. No rash noted. Psychiatric: Mood and affect are calm, fairly sedate  ____________________________________________   LABS (all labs ordered are listed, but only abnormal results are displayed)  Labs Reviewed  COMPREHENSIVE METABOLIC PANEL - Abnormal; Notable for the following components:      Result Value   Glucose, Bld 114 (*)    All other components within normal limits  AMMONIA - Abnormal; Notable for the following components:   Ammonia 44 (*)    All other components within normal limits  GLUCOSE, CAPILLARY - Abnormal; Notable for the following components:   Glucose-Capillary 118 (*)  All other components within normal limits  BLOOD GAS, ARTERIAL - Abnormal; Notable for the following components:   pO2, Arterial 64 (*)    All other components within normal limits  TROPONIN I (HIGH SENSITIVITY) - Abnormal; Notable for the following components:   Troponin I (High Sensitivity) 43 (*)    All other components within normal limits  CULTURE, BLOOD (ROUTINE X 2)  CULTURE, BLOOD (ROUTINE X 2)  RESPIRATORY PANEL BY RT PCR (FLU A&B, COVID)  CBC WITH DIFFERENTIAL/PLATELET  LACTIC ACID, PLASMA  ETHANOL  URINALYSIS, COMPLETE (UACMP) WITH MICROSCOPIC  URINE DRUG SCREEN, QUALITATIVE ([facility] ONLY)  TSH  CBG MONITORING, ED  TROPONIN I (HIGH SENSITIVITY)   ____________________________________________  EKG  Reviewed and to remain at 2230 Heart rate 100 QRS 100 QTc 440 Sinus tachycardia, with nonspecific T wave abnormalities noted in multiple leads including lateral.  No STEMI ____________________________________________  RADIOLOGY  CT Head Wo Contrast  Result Date: 08/18/2019 CLINICAL DATA:  Altered level of consciousness, lethargy, headache EXAM: CT HEAD WITHOUT CONTRAST TECHNIQUE: Contiguous axial images  were obtained from the base of the skull through the vertex without intravenous contrast. COMPARISON:  09/06/2017 FINDINGS: Brain: No acute infarct or hemorrhage. Lateral ventricles and midline structures are unremarkable. No acute extra-axial fluid collections. No mass effect. Vascular: No hyperdense vessel or unexpected calcification. Skull: Normal. Negative for fracture or focal lesion. Sinuses/Orbits: Circumferential mucosal thickening right maxillary sinus unchanged since prior exam consistent with chronic maxillary sinus disease. Remaining sinuses are now clear. Other: None. IMPRESSION: 1. Right maxillary sinus disease. 2. Otherwise no acute intracranial process. Electronically Signed   By: Sharlet Salina M.D.   On: 08/18/2019 22:27   DG Chest Port 1 View  Result Date: 08/18/2019 CLINICAL DATA:  Lethargy. EXAM: PORTABLE CHEST 1 VIEW COMPARISON:  Sep 05, 2017 FINDINGS: Mild, chronic appearing increased lung markings are seen without evidence of acute infiltrate, pleural effusion or pneumothorax. The cardiac silhouette is moderately enlarged. Chronic third and fourth left rib fractures are noted. IMPRESSION: Chronic appearing increased lung markings without evidence of acute or active cardiopulmonary disease. Electronically Signed   By: Aram Candela M.D.   On: 08/18/2019 22:49    Chest x-ray reviewed negative for acute disease. CT the head notable for right maxillary sinus disease ____________________________________________   PROCEDURES  Procedure(s) performed: None  Procedures  Critical Care performed: No  ____________________________________________   INITIAL IMPRESSION / ASSESSMENT AND PLAN / ED COURSE  Pertinent labs & imaging results that were available during my care of the patient were reviewed by me and considered in my medical decision making (see chart for details).   Patient presents with a somewhat unclear history, he does however report that consistently has had 3 to  4 days of severe right ear pain.  He has evidence of what appears to be possible otitis externa with the swollen very tender ear canal.  Additionally he has what appears to be right maxillary sinus disease.  He is somnolent, evidently to the point that he was nearly unarousable at [facility], though he does arouse here and is able to have some conversation before falling back to sleep.  His alcohol level is normal, pending drug screen.  Question if this could be metabolic in origin, differential would certainly include etiology such as severe sinusitis, septic or complicated sinusitis, feel without a headache or fever or neck stiffness would be unlikely to represent acute meningitis or encephalitis this would remain on the differential.  Discussed with the patient,  we will proceed with MRI of the brain, MRV to evaluate for further sinus disease, on the differential would also be 6 venous sinus thrombosis, encephalitis meningitis.  However given his symptomatology 3 to 4 days presentation, no focal neurologic deficits on exam, I not overly convinced that this represents a central CNS infection.  I am also concerned with the presentation that we need to evaluate other causes such as toxic etiologies with such as drug screen etc.  ----------------------------------------- 11:42 PM on 08/18/2019 -----------------------------------------     Clinical Course as of Aug 19 54  Mon Aug 19, 2019  5009 Admit discussed with Dr. Mikeal Hawthorne.    [MQ]    Clinical Course User Index [MQ] Sharyn Creamer, MD    Personally saw and evaluated the patient in conjunction with the neurologist.  Neurology recommendations received, will start on antibiotic, obtain MRI and observe the patient closely.  Also place consultation to neuro hospitalist for tomorrow.  Neurology and I both agree, nonfocal examination, does not appear to have any meningeal signs.  Neurologist specifically advises that treatment for meningitis does not  appear to be indicated at this time, but treatment focused on right ear pain swelling and possible otitis does seem indicated and I would agree.  Etiology of altered mental status remains unclear, will admit to hospitalist service for further work-up and care at this point.  Zaide Kardell was evaluated in Emergency Department on 08/19/2019 for the symptoms described in the history of present illness. He was evaluated in the context of the global COVID-19 pandemic, which necessitated consideration that the patient might be at risk for infection with the SARS-CoV-2 virus that causes COVID-19. Institutional protocols and algorithms that pertain to the evaluation of patients at risk for COVID-19 are in a state of rapid change based on information released by regulatory bodies including the CDC and federal and state organizations. These policies and algorithms were followed during the patient's care in the ED.  ____________________________________________   FINAL CLINICAL IMPRESSION(S) / ED DIAGNOSES  Final diagnoses:  AMS (altered mental status)        Note:  This document was prepared using Dragon voice recognition software and may include unintentional dictation errors       Sharyn Creamer, MD 08/19/19 [phone]

## 2019-08-18 NOTE — ED Notes (Signed)
Pt taken to CT.

## 2019-08-18 NOTE — ED Provider Notes (Addendum)
[facility] Emergency Department Provider Note  ____________________________________________  Time seen: Approximately 9:58 PM  I have reviewed the triage vital signs and the nursing notes.   HISTORY  Chief Complaint Back Pain  Level 5 caveat: Patient altered, unable to provide more than a few words at the time.  HPI Douglas Conway is a 64 y.o. male presents the emergency department reportedly complaining of back and jaw pain.  Patient was triaged, placed in a room.  On my arrival to the room patient was very sluggish, would not respond appropriately.  Patient was constantly drifting off.  Patient would be aroused, mumble either incomprehensible words, or complaint of a different complaint and immediately drowse off again.  Patient has complained of a headache, neck pain, fever, jaw pain, chest pain, back pain.  Again every time patient is aroused he complains of a different complaint and does not talk about the previous complaint.   Patient is unable to follow commands.  He did not elaborate on any of the complaints that he is told me.  Patient only mutters a few words and immediately rouses off again.        Past Medical History:  Diagnosis Date  . 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 Chronic systolic congestive heart failure ([place]) 05/14/2019  . Coronary artery disease   . HIV (human immunodeficiency virus infection) ([place])   . HIV disease ([place]) 06/28/2018  . Hypertension 06/28/2018  . Lateral epicondylitis 01/07/2019  . Other specified hypothyroidism 01/07/2019  . Thyroid disease     Patient Active Problem List   Diagnosis Date Noted  . Chronic systolic congestive heart failure ([place]) 05/14/2019  . Lateral epicondylitis 01/07/2019  . Other specified hypothyroidism 01/07/2019  . HIV disease ([place]) 06/28/2018  . 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 Hypertension 06/28/2018  . Healthcare maintenance 06/28/2018    Past Surgical History:  Procedure  Laterality Date  . CORONARY ANGIOPLASTY WITH STENT PLACEMENT      Prior to Admission medications   Medication Sig Start Date End Date Taking? Authorizing Provider  acyclovir (ZOVIRAX) 400 MG tablet Take 1 tablet (400 mg total) by mouth 5 (five) times daily. 05/14/19   Golden Circle, FNP  BIKTARVY [phone] MG TABS tablet TAKE 1 TABLET BY MOUTH DAILY 07/04/19   Golden Circle, FNP  carvedilol (COREG) 6.25 MG tablet Take 1 tablet (6.25 mg total) by mouth 2 (two) times daily. 05/23/19   Dorothy Spark, MD  Cholecalciferol 125 MCG (5000 UT) capsule Take 1 capsule (5,000 Units total) by mouth daily. 06/13/19   Dorothy Spark, MD  digoxin (LANOXIN) 0.125 MG tablet Take 1 tablet (125 mcg total) by mouth daily. 05/23/19   Dorothy Spark, MD  levothyroxine (SYNTHROID) 175 MCG tablet Take 1 tablet (175 mcg total) by mouth daily before breakfast. 06/13/19   Dorothy Spark, MD  lisinopril (ZESTRIL) 10 MG tablet Take 1 tablet (10 mg total) by mouth daily. 05/23/19   Dorothy Spark, MD  rosuvastatin (CRESTOR) 20 MG tablet Take 1 tablet (20 mg total) by mouth daily. 06/13/19   Dorothy Spark, MD    Allergies Plavix [clopidogrel bisulfate]  No family history on file.  Social History Social History   Tobacco Use  . Smoking status: Current Some Day Smoker    Packs/day: 0.10    Start date: 01/07/1968  . Smokeless tobacco: Never Used  . Tobacco comment: cutting back  Substance Use Topics  . Alcohol use: Yes  Alcohol/week: 12.0 standard drinks    Types: 12 Cans of beer per week  . Drug use: Never     Review of Systems limited by patient Constitutional: Reports fever/chills Eyes: Unsure of visual changes ENT: Jaw pain, uncertain laterality.  Unsure URI symptoms Cardiovascular: Reports chest pain. Respiratory: Unsure of any respiratory complaints Gastrointestinal: Unsure of any GI complaints  Musculoskeletal: Reports neck pain Skin: Negative for rash, abrasions,  lacerations, ecchymosis. Neurological: Reports headache but unsure of focal weakness or numbness. 10-point ROS otherwise negative.  ____________________________________________   PHYSICAL EXAM:  VITAL SIGNS: ED Triage Vitals  Enc Vitals Group     BP 08/18/19 2027 127/76     Pulse Rate 08/18/19 2027 (!) 104     Resp 08/18/19 2027 18     Temp 08/18/19 2027 98.6 F (37 C)     Temp Source 08/18/19 2027 Oral     SpO2 08/18/19 2027 95 %     Weight 08/18/19 2025 190 lb (86.2 kg)     Height 08/18/19 2025 6\' 1"  (1.854 m)     Head Circumference --      Peak Flow --      Pain Score 08/18/19 2156 Asleep     Pain Loc --      Pain Edu? --      Excl. in GC? --      Constitutional: Patient continues to drift off, is very difficult to arouse.  no acute distress. Eyes: Conjunctivae are normal. PERRL. EOMI. Head: Atraumatic. ENT:      Ears:       Nose: No congestion/rhinnorhea.      Mouth/Throat: Mucous membranes are moist.  Neck: No stridor.  No palpable abnormality of the cervical spine  Cardiovascular: Normal rate, regular rhythm. Normal S1 and S2.  Good peripheral circulation. Respiratory: Normal respiratory effort without tachypnea or retractions. Lungs CTAB. Good air entry to the bases with no decreased or absent breath sounds. Musculoskeletal: Full range of motion to all extremities. No gross deformities appreciated. Neurologic: Patient with only a few words at a time before drowsing off.  Patient with slurred speech..  Unable to assess for any neuro deficits at this time.   Skin:  Skin is warm, dry and intact. No rash noted. Psychiatric: Unable to assess   ____________________________________________   LABS (all labs ordered are listed, but only abnormal results are displayed)  Labs Reviewed  CULTURE, BLOOD (ROUTINE X 2)  CULTURE, BLOOD (ROUTINE X 2)  CBC WITH DIFFERENTIAL/PLATELET  COMPREHENSIVE METABOLIC PANEL  AMMONIA  LACTIC ACID, PLASMA  LACTIC ACID, PLASMA   URINALYSIS, COMPLETE (UACMP) WITH MICROSCOPIC  ETHANOL  URINE DRUG SCREEN, QUALITATIVE ([facility] ONLY)  CBG MONITORING, ED  TROPONIN I (HIGH SENSITIVITY)   ____________________________________________  EKG   ____________________________________________  RADIOLOGY   No results found.  ____________________________________________    PROCEDURES  Procedure(s) performed:    Procedures    Medications - No data to display   ____________________________________________   INITIAL IMPRESSION / ASSESSMENT AND PLAN / ED COURSE  Pertinent labs & imaging results that were available during my care of the patient were reviewed by me and considered in my medical decision making (see chart for details).  Review of the [place] CSRS was performed in accordance of the [facility] prior to dispensing any controlled drugs.            Patient presented to the emergency department reportedly complaining of back and jaw pain.  On my assessment of the patient, patient was  very drowsy, appears altered.  Patient would be aroused for a few words, seems to be slurring his speech.  Patient would immediately drift off.  He was unable to follow any commands for me.  Patient had complained of multiple complaints but did not complain of the same issue as I would arouse him.  Patient complained of fever, headache, neck pain, jaw pain, chest pain, back pain.  I was unable to quantify or qualify any of these complaints.  Again patient would be aroused after vigorous shaking, complain of something and immediately drift off.  Given this, patient will be evaluated with labs, imaging.  Given the altered state of the patient, he will be transferred to the major side emergency department for the remainder of his work-up.  I have discussed the patient, his presentation, pending work-up with attending provider, Dr. Fanny Bien.  Patient care will be handed over to Dr. Fanny Bien at this time.  Given limited history, inability to follow  commands, differential is broad to include CVA, sepsis, meningitis, STEMI/ACS, overdose, encephalopathy.      This chart was dictated using voice recognition software/Dragon. Despite best efforts to proofread, errors can occur which can change the meaning. Any change was purely unintentional.    Racheal Patches, PA-C 08/18/19 2232    Cielo Arias, Delorise Royals, PA-C 08/18/19 2234    Sharyn Creamer, MD 08/19/19 [phone]

## 2019-08-19 ENCOUNTER — Emergency Department: Payer: Medicaid Other

## 2019-08-19 DIAGNOSIS — E785 Hyperlipidemia, unspecified: Secondary | ICD-10-CM | POA: Diagnosis present

## 2019-08-19 DIAGNOSIS — H70009 Acute mastoiditis without complications, unspecified ear: Secondary | ICD-10-CM | POA: Diagnosis present

## 2019-08-19 DIAGNOSIS — R410 Disorientation, unspecified: Secondary | ICD-10-CM | POA: Diagnosis not present

## 2019-08-19 DIAGNOSIS — I160 Hypertensive urgency: Secondary | ICD-10-CM

## 2019-08-19 DIAGNOSIS — Z955 Presence of coronary angioplasty implant and graft: Secondary | ICD-10-CM | POA: Diagnosis not present

## 2019-08-19 DIAGNOSIS — H70001 Acute mastoiditis without complications, right ear: Secondary | ICD-10-CM | POA: Diagnosis not present

## 2019-08-19 DIAGNOSIS — B20 Human immunodeficiency virus [HIV] disease: Secondary | ICD-10-CM | POA: Diagnosis present

## 2019-08-19 DIAGNOSIS — I2510 Atherosclerotic heart disease of native coronary artery without angina pectoris: Secondary | ICD-10-CM | POA: Diagnosis present

## 2019-08-19 DIAGNOSIS — H60391 Other infective otitis externa, right ear: Secondary | ICD-10-CM | POA: Diagnosis not present

## 2019-08-19 DIAGNOSIS — I110 Hypertensive heart disease with heart failure: Secondary | ICD-10-CM | POA: Diagnosis present

## 2019-08-19 DIAGNOSIS — E039 Hypothyroidism, unspecified: Secondary | ICD-10-CM | POA: Diagnosis present

## 2019-08-19 DIAGNOSIS — Z79890 Hormone replacement therapy (postmenopausal): Secondary | ICD-10-CM | POA: Diagnosis not present

## 2019-08-19 DIAGNOSIS — I5022 Chronic systolic (congestive) heart failure: Secondary | ICD-10-CM | POA: Diagnosis present

## 2019-08-19 DIAGNOSIS — H9201 Otalgia, right ear: Secondary | ICD-10-CM | POA: Diagnosis present

## 2019-08-19 DIAGNOSIS — Z20822 Contact with and (suspected) exposure to covid-19: Secondary | ICD-10-CM | POA: Diagnosis present

## 2019-08-19 DIAGNOSIS — H60501 Unspecified acute noninfective otitis externa, right ear: Secondary | ICD-10-CM | POA: Diagnosis present

## 2019-08-19 DIAGNOSIS — G4733 Obstructive sleep apnea (adult) (pediatric): Secondary | ICD-10-CM | POA: Diagnosis present

## 2019-08-19 DIAGNOSIS — R400 Somnolence: Secondary | ICD-10-CM | POA: Diagnosis present

## 2019-08-19 DIAGNOSIS — F17210 Nicotine dependence, cigarettes, uncomplicated: Secondary | ICD-10-CM | POA: Diagnosis present

## 2019-08-19 DIAGNOSIS — Z79899 Other long term (current) drug therapy: Secondary | ICD-10-CM | POA: Diagnosis not present

## 2019-08-19 DIAGNOSIS — M5442 Lumbago with sciatica, left side: Secondary | ICD-10-CM | POA: Diagnosis present

## 2019-08-19 LAB — CBC
HCT: 44.2 % (ref 39.0–52.0)
Hemoglobin: 14.9 g/dL (ref 13.0–17.0)
MCH: 31.2 pg (ref 26.0–34.0)
MCHC: 33.7 g/dL (ref 30.0–36.0)
MCV: 92.5 fL (ref 80.0–100.0)
Platelets: 273 10*3/uL (ref 150–400)
RBC: 4.78 MIL/uL (ref 4.22–5.81)
RDW: 12.5 % (ref 11.5–15.5)
WBC: 10.8 10*3/uL — ABNORMAL HIGH (ref 4.0–10.5)
nRBC: 0 % (ref 0.0–0.2)

## 2019-08-19 LAB — RESPIRATORY PANEL BY RT PCR (FLU A&B, COVID)
Influenza A by PCR: NEGATIVE
Influenza B by PCR: NEGATIVE
SARS Coronavirus 2 by RT PCR: NEGATIVE

## 2019-08-19 LAB — URINALYSIS, COMPLETE (UACMP) WITH MICROSCOPIC
Bacteria, UA: NONE SEEN
Bilirubin Urine: NEGATIVE
Glucose, UA: NEGATIVE mg/dL
Hgb urine dipstick: NEGATIVE
Ketones, ur: NEGATIVE mg/dL
Leukocytes,Ua: NEGATIVE
Nitrite: NEGATIVE
Protein, ur: NEGATIVE mg/dL
Specific Gravity, Urine: 1.015 (ref 1.005–1.030)
Squamous Epithelial / HPF: NONE SEEN (ref 0–5)
pH: 6 (ref 5.0–8.0)

## 2019-08-19 LAB — BLOOD GAS, ARTERIAL
Acid-Base Excess: 0.1 mmol/L (ref 0.0–2.0)
Bicarbonate: 26 mmol/L (ref 20.0–28.0)
FIO2: 0.21
O2 Saturation: 91.2 %
Patient temperature: 37
pCO2 arterial: 46 mmHg (ref 32.0–48.0)
pH, Arterial: 7.36 (ref 7.350–7.450)
pO2, Arterial: 64 mmHg — ABNORMAL LOW (ref 83.0–108.0)

## 2019-08-19 LAB — URINE DRUG SCREEN, QUALITATIVE (ARMC ONLY)
Amphetamines, Ur Screen: POSITIVE — AB
Barbiturates, Ur Screen: NOT DETECTED
Benzodiazepine, Ur Scrn: NOT DETECTED
Cannabinoid 50 Ng, Ur ~~LOC~~: NOT DETECTED
Cocaine Metabolite,Ur ~~LOC~~: NOT DETECTED
MDMA (Ecstasy)Ur Screen: NOT DETECTED
Methadone Scn, Ur: NOT DETECTED
Opiate, Ur Screen: NOT DETECTED
Phencyclidine (PCP) Ur S: NOT DETECTED
Tricyclic, Ur Screen: NOT DETECTED

## 2019-08-19 LAB — TROPONIN I (HIGH SENSITIVITY): Troponin I (High Sensitivity): 41 ng/L — ABNORMAL HIGH (ref <18)

## 2019-08-19 LAB — BASIC METABOLIC PANEL
Anion gap: 6 (ref 5–15)
BUN: 15 mg/dL (ref 8–23)
CO2: 26 mmol/L (ref 22–32)
Calcium: 8.3 mg/dL — ABNORMAL LOW (ref 8.9–10.3)
Chloride: 105 mmol/L (ref 98–111)
Creatinine, Ser: 0.79 mg/dL (ref 0.61–1.24)
GFR calc Af Amer: >60 mL/min (ref >60)
GFR calc non Af Amer: >60 mL/min (ref >60)
Glucose, Bld: 133 mg/dL — ABNORMAL HIGH (ref 70–99)
Potassium: 4 mmol/L (ref 3.5–5.1)
Sodium: 137 mmol/L (ref 135–145)

## 2019-08-19 LAB — TSH: TSH: 0.61 u[IU]/mL (ref 0.350–4.500)

## 2019-08-19 MED ORDER — LISINOPRIL 10 MG PO TABS
10.0000 mg | ORAL_TABLET | Freq: Every day | ORAL | Status: DC
  Administered 2019-08-19: 10 mg via ORAL
  Filled 2019-08-19 (×2): qty 1

## 2019-08-19 MED ORDER — LABETALOL HCL 5 MG/ML IV SOLN: 20.0000 mg | INTRAVENOUS | Status: DC | PRN

## 2019-08-19 MED ORDER — CARVEDILOL 6.25 MG PO TABS
6.2500 mg | ORAL_TABLET | Freq: Two times a day (BID) | ORAL | Status: DC
  Administered 2019-08-19 (×2): 6.25 mg via ORAL
  Filled 2019-08-19 (×3): qty 1

## 2019-08-19 MED ORDER — ACYCLOVIR 200 MG PO CAPS
400.0000 mg | ORAL_CAPSULE | Freq: Every day | ORAL | Status: DC
  Administered 2019-08-19 – 2019-08-20 (×5): 400 mg via ORAL
  Filled 2019-08-19 (×8): qty 2

## 2019-08-19 MED ORDER — VANCOMYCIN HCL 2000 MG/400ML IV SOLN
2000.0000 mg | Freq: Once | INTRAVENOUS | Status: AC
  Administered 2019-08-19: 2000 mg via INTRAVENOUS
  Filled 2019-08-19 (×2): qty 400

## 2019-08-19 MED ORDER — ROSUVASTATIN CALCIUM 10 MG PO TABS
20.0000 mg | ORAL_TABLET | Freq: Every day | ORAL | Status: DC
  Administered 2019-08-19: 20 mg via ORAL
  Filled 2019-08-19: qty 2

## 2019-08-19 MED ORDER — PHENOL 1.4 % MT LIQD
1.0000 | OROMUCOSAL | Status: DC | PRN
  Administered 2019-08-19: 1 via OROMUCOSAL
  Filled 2019-08-19: qty 177

## 2019-08-19 MED ORDER — MORPHINE SULFATE (PF) 2 MG/ML IV SOLN: 1.0000 mg | INTRAVENOUS | Status: DC | PRN

## 2019-08-19 MED ORDER — ONDANSETRON HCL 4 MG PO TABS: 4.0000 mg | ORAL_TABLET | Freq: Four times a day (QID) | ORAL | Status: DC | PRN

## 2019-08-19 MED ORDER — ACETAMINOPHEN 325 MG PO TABS
650.0000 mg | ORAL_TABLET | Freq: Four times a day (QID) | ORAL | Status: DC | PRN
  Administered 2019-08-19: 650 mg via ORAL
  Filled 2019-08-19: qty 2

## 2019-08-19 MED ORDER — CIPROFLOXACIN-DEXAMETHASONE 0.3-0.1 % OT SUSP
3.0000 [drp] | Freq: Two times a day (BID) | OTIC | Status: DC
  Administered 2019-08-19 (×2): 3 [drp] via OTIC
  Filled 2019-08-19 (×2): qty 7.5

## 2019-08-19 MED ORDER — VANCOMYCIN HCL IN DEXTROSE 1-5 GM/200ML-% IV SOLN
1000.0000 mg | Freq: Three times a day (TID) | INTRAVENOUS | Status: DC
  Filled 2019-08-19 (×2): qty 200

## 2019-08-19 MED ORDER — SODIUM CHLORIDE 0.9 % IV SOLN
1.5000 g | Freq: Four times a day (QID) | INTRAVENOUS | Status: DC
  Administered 2019-08-19 – 2019-08-20 (×3): 1.5 g via INTRAVENOUS
  Filled 2019-08-19 (×3): qty 4
  Filled 2019-08-19 (×2): qty 1.5
  Filled 2019-08-19: qty 4
  Filled 2019-08-19: qty 1.5

## 2019-08-19 MED ORDER — LEVOTHYROXINE SODIUM 175 MCG PO TABS
175.0000 ug | ORAL_TABLET | Freq: Every day | ORAL | Status: DC
  Administered 2019-08-20: 175 ug via ORAL
  Filled 2019-08-19: qty 1

## 2019-08-19 MED ORDER — SODIUM CHLORIDE 0.9 % IV SOLN: 2.0000 g | Freq: Two times a day (BID) | INTRAVENOUS | Status: DC

## 2019-08-19 MED ORDER — ENOXAPARIN SODIUM 40 MG/0.4ML ~~LOC~~ SOLN
40.0000 mg | SUBCUTANEOUS | Status: DC
  Administered 2019-08-19 – 2019-08-20 (×2): 40 mg via SUBCUTANEOUS
  Filled 2019-08-19 (×2): qty 0.4

## 2019-08-19 MED ORDER — VITAMIN D 25 MCG (1000 UNIT) PO TABS
5000.0000 [IU] | ORAL_TABLET | Freq: Every day | ORAL | Status: DC
  Administered 2019-08-19: 5000 [IU] via ORAL
  Filled 2019-08-19 (×2): qty 5

## 2019-08-19 MED ORDER — TRAZODONE HCL 50 MG PO TABS: 50.0000 mg | ORAL_TABLET | Freq: Every evening | ORAL | Status: DC | PRN

## 2019-08-19 MED ORDER — ONDANSETRON HCL 4 MG/2ML IJ SOLN: 4.0000 mg | Freq: Four times a day (QID) | INTRAMUSCULAR | Status: DC | PRN

## 2019-08-19 MED ORDER — SODIUM CHLORIDE 0.9 % IV SOLN: INTRAVENOUS | Status: DC

## 2019-08-19 MED ORDER — CIPROFLOXACIN IN D5W 400 MG/200ML IV SOLN
400.0000 mg | Freq: Once | INTRAVENOUS | Status: AC
  Administered 2019-08-19: 400 mg via INTRAVENOUS
  Filled 2019-08-19: qty 200

## 2019-08-19 MED ORDER — BICTEGRAVIR-EMTRICITAB-TENOFOV 50-200-25 MG PO TABS
1.0000 | ORAL_TABLET | Freq: Every day | ORAL | Status: DC
  Administered 2019-08-19: 1 via ORAL
  Filled 2019-08-19 (×3): qty 1

## 2019-08-19 MED ORDER — LEVOTHYROXINE SODIUM 50 MCG PO TABS
175.0000 ug | ORAL_TABLET | Freq: Every day | ORAL | Status: DC
  Administered 2019-08-19: 175 ug via ORAL
  Filled 2019-08-19: qty 1.5
  Filled 2019-08-19: qty 1

## 2019-08-19 MED ORDER — ACETAMINOPHEN 650 MG RE SUPP: 650.0000 mg | Freq: Four times a day (QID) | RECTAL | Status: DC | PRN

## 2019-08-19 MED ORDER — TRAMADOL HCL 50 MG PO TABS
50.0000 mg | ORAL_TABLET | Freq: Four times a day (QID) | ORAL | Status: DC | PRN
  Administered 2019-08-19 (×2): 50 mg via ORAL
  Filled 2019-08-19 (×2): qty 1

## 2019-08-19 MED ORDER — MAGNESIUM HYDROXIDE 400 MG/5ML PO SUSP: 30.0000 mL | Freq: Every day | ORAL | Status: DC | PRN

## 2019-08-19 MED ORDER — DIGOXIN 125 MCG PO TABS
125.0000 ug | ORAL_TABLET | Freq: Every day | ORAL | Status: DC
  Administered 2019-08-19: 125 ug via ORAL
  Filled 2019-08-19 (×2): qty 1

## 2019-08-19 MED ORDER — ACETIC ACID 2 % OT SOLN: 4.0000 [drp] | Freq: Four times a day (QID) | OTIC | Status: DC

## 2019-08-19 MED ORDER — GADOBUTROL 1 MMOL/ML IV SOLN
7.5000 mL | Freq: Once | INTRAVENOUS | Status: AC | PRN
  Administered 2019-08-19: 7.5 mL via INTRAVENOUS

## 2019-08-19 MED ORDER — SODIUM CHLORIDE 0.9 % IV SOLN
2.0000 g | Freq: Three times a day (TID) | INTRAVENOUS | Status: DC
  Administered 2019-08-19: 2 g via INTRAVENOUS
  Filled 2019-08-19 (×4): qty 2

## 2019-08-19 MED ORDER — VANCOMYCIN HCL 1500 MG/300ML IV SOLN
1500.0000 mg | Freq: Two times a day (BID) | INTRAVENOUS | Status: DC
  Administered 2019-08-19: 1500 mg via INTRAVENOUS
  Filled 2019-08-19 (×3): qty 300

## 2019-08-19 NOTE — Consult Note (Signed)
Douglas Conway, Douglas Conway 1956-03-31 Douglas Conway, Douglas Merles, MD  Reason for Consult: Evaluate because of right ear pain  HPI: The patient is a 64 year old white male who has had 3 to 4 days history of right ear pain.  He tried putting some eardrops in and is continued to get worse.  He has pain around the front of the ear and behind the ear and his ear is completely stopped up.  His left ear is not bothering him.  He is also complaining of left sciatica and he is very tired and somnolent.  He was admitted yesterday and started on Cipro HC eardrops and vancomycin/cefepime.  He has not had right ear pain like this previously.  Allergies:  Allergies  Allergen Reactions  . Plavix [Clopidogrel Bisulfate] Rash    ROS: Review of systems normal other than 12 systems except per HPI.  PMH:  Past Medical History:  Diagnosis Date  . 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 Chronic systolic congestive heart failure ([place]) 05/14/2019  . Coronary artery disease   . HIV (human immunodeficiency virus infection) ([place])   . HIV disease ([place]) 06/28/2018  . Hypertension 06/28/2018  . Lateral epicondylitis 01/07/2019  . Other specified hypothyroidism 01/07/2019  . Thyroid disease     FH: No family history on file.  SH:  Social History   Socioeconomic History  . Marital status: Divorced    Spouse name: Not on file  . Number of children: Not on file  . Years of education: Not on file  . Highest education level: Not on file  Occupational History  . Not on file  Tobacco Use  . Smoking status: Current Some Day Smoker    Packs/day: 0.10    Start date: 01/07/1968  . Smokeless tobacco: Never Used  . Tobacco comment: cutting back  Substance and Sexual Activity  . Alcohol use: Yes    Alcohol/week: 12.0 standard drinks    Types: 12 Cans of beer per week  . Drug use: Never  . Sexual activity: Not Currently    Partners: Female    Birth control/protection: Condom    Comment: given condoms  Other Topics Concern  .  Not on file  Social History Narrative  . Not on file   Social Determinants of Health   Financial Resource Strain:   . Difficulty of Paying Living Expenses:   Food Insecurity:   . Worried About Charity fundraiser in the Last Year:   . Arboriculturist in the Last Year:   Transportation Needs:   . Film/video editor (Medical):   Marland Kitchen Lack of Transportation (Non-Medical):   Physical Activity:   . Days of Exercise per Week:   . Minutes of Exercise per Session:   Stress:   . Feeling of Stress :   Social Connections:   . Frequency of Communication with Friends and Family:   . Frequency of Social Gatherings with Friends and Family:   . Attends Religious Services:   . Active Member of Clubs or Organizations:   . Attends Archivist Meetings:   Marland Kitchen Marital Status:   Intimate Partner Violence:   . Fear of Current or Ex-Partner:   . Emotionally Abused:   Marland Kitchen Physically Abused:   . Sexually Abused:     PSH:  Past Surgical History:  Procedure Laterality Date  . CORONARY ANGIOPLASTY WITH STENT PLACEMENT      Physical  Exam: His right ear canal is swollen shut.  There is some  swelling in the tragus and preauricular area and some just behind the ear.  There is no cellulitis involving the auricle.  His left ear canal and eardrum look normal.  His nose is open anteriorly with no sign of acute infection.  His oropharynx shows the tongue to be a little dry as he is snoring continuously and having apneic episodes that are lasting 30 seconds or more.  His palate looks a little long but there is no redness or signs of infection in his throat.  He has a Mallampati 4 posterior pharyngeal opening.  He has a good set of teeth with no oral lesions and no evidence of gum disease.  His neck is negative for any nodes or masses but a little tender right around his right ear.  I reviewed the MRI scan in detail.  This shows completely obstructed right ear canal although the left ear canal is open.   There is mild mucosal thickening in the mastoid but the middle ear does not appear to be involved.  He also shows some septal deviation and mucous membrane thickening in his right maxillary sinus.  Just the upper portion of the oropharynx can be seen and there is very little room here.  A/P: 1.  The patient has acute external otitis with pain and blockage.  Current antibiotics should cover this well as Pseudomonas is the most likely bacteria here.  The eardrops are the most important.  His ear will need to be cleaned out in the near future to help clear this completely and prevent this from recurring.  This can be done in the office once he has been discharged.  An audiogram should be then done with the ears clear.  2.  The patient has observed obstructive sleep apnea that is leading to some of his somnolence.  Because of his sciatica he tends to lie flat on his back which is making this worse.  He needs a sleep study done as soon as possible and to get on some CPAP/BiPAP to help improve his airway while he sleeping.  He seems to be significantly sleep deprived currently because of the sleep apnea.  This may be why he has been using amphetamines to keep him more alert.   Douglas Conway Douglas Conway 08/19/2019 8:46 AM

## 2019-08-19 NOTE — ED Notes (Signed)
Pt continually removing equipment and attempts to get up, then immediately falls asleep. Attempted to use fall alarm, however pt is continually attempting to get up. Pt moved to room 15 to be visual to staff more frequently. Report to rebekah, rn.

## 2019-08-19 NOTE — Progress Notes (Signed)
RN notified MD pt is refusing telemetry at this point.

## 2019-08-19 NOTE — Progress Notes (Signed)
Pharmacy Antibiotic Note  Douglas Conway is a 64 y.o. male admitted on 08/18/2019 with mastoiditis.  Pharmacy has been consulted for vanc/cefepime dosing.  Plan: Patient to receive vanc 2g IV load  Will continue vanc 1g IV q8h per traditional dosing Ke 0.0931 T1/2 ~ 8 hrs CrCl 106.9 ml/min  Will continue cefepime 2g IV q8h per CrCl > 60 ml/min and continue to monitor.  Height: 6\' 1"  (185.4 cm) Weight: 86.2 kg (190 lb) IBW/kg (Calculated) : 79.9  Temp (24hrs), Avg:98.6 F (37 C), Min:98.6 F (37 C), Max:98.6 F (37 C)  Recent Labs  Lab 08/18/19 2230  WBC 8.7  CREATININE 0.82  LATICACIDVEN 1.1    Estimated Creatinine Clearance: 106.9 mL/min (by C-G formula based on SCr of 0.82 mg/dL).    Allergies  Allergen Reactions  . Plavix [Clopidogrel Bisulfate] Rash    Thank you for allowing pharmacy to be a part of this patient's care.  10/18/19, PharmD, BCPS Clinical Pharmacist 08/19/2019 7:42 AM

## 2019-08-19 NOTE — ED Provider Notes (Signed)
Dr. Mikeal Hawthorne requested results of UDS prior to placing admit orders.   UDS has resulted, amphetamines +, otherwise negative. Perhaps some of his somnolence is 2/2 to coming down from amphetamines. MRI findings confirm otomastoiditis and otitis externa, no visible spread of infection into the brain or cranial vault.  Perhaps a small and/or developing abscess to the right temporal scalp. IV antibiotics already ordered by Dr. Fanny Bien. Attempted to reach Dr. Mikeal Hawthorne but his shift has since ended. Discussed with patient placement to re-page out admission.   4:10 AM Discussed with Dr. Arville Care who will admit.    Miguel Aschoff., MD 08/19/19 [phone]

## 2019-08-19 NOTE — ED Notes (Signed)
Pt returned from MRI °

## 2019-08-19 NOTE — ED Notes (Signed)
Pt repeatedly removing monitoring equipment.

## 2019-08-19 NOTE — ED Notes (Signed)
Pt declines to leave oxygen in place. Attempt to encourage pt to urinate, pt states "fuck off".

## 2019-08-19 NOTE — ED Notes (Signed)
Pt performing neuro consult with tele neuro. Pt continues to arouse to verbal stimuli, pt coughing large amounts of brown sputum onto sleeves, declines to leave oxygen in place and declines to change into gown.

## 2019-08-19 NOTE — ED Notes (Signed)
Pt refuses to leave oxygen in place.  

## 2019-08-19 NOTE — ED Notes (Addendum)
Pt informed that we have a room ready for him that we just need to move over to this WC and go upstairs. Pt began yelling at this RN and stating he wanted something to drink and he has been calling out all night. Pt informed that this RN came into room when he called out and he was snoring asleep. Pt states he goes in an out of sleep. Pt apologized and stated it was this RN he was upset with. Pt states he is not going upstairs until he gets a cup of water. Pt informed that this RN will get him some water.  Pt given water and transported to 2C-223 at this time by this RN

## 2019-08-19 NOTE — ED Notes (Signed)
Pt's jeans, sneakers, cell phone, 3 sets of keys, miscellaneous change in coins placed in belonging bag and hung from iv pole on back of bed.

## 2019-08-19 NOTE — Consult Note (Signed)
[facility] TeleNeurology Consult Services  Stat Consult  Date of Service:   08/18/2019 23:43:09  Impression:     .  R41.82 - AMS (Altered Mental Status)  Comments/Sign-Out: Treatment of ear infection - antibiotics MRI Brain and Internal auditory canal with/without if appropriate utox, blood cultures observe, neurochecks  CT HEAD: Showed No Acute Hemorrhage or Acute Core Infarct  Metrics: [facility] Notification Time: 08/18/2019 23:40:58 Stamp Time: 08/18/2019 23:43:09 Callback Response Time: 08/18/2019 23:43:55  Our recommendations are outlined below.   Other WorkUp:     .  Infectious/metabolic workup per primary team     .  Check an ammonia level     .  Check B12 level     .  Check TSH     .  Check Urinalysis  Disposition: Neurology Follow Up Recommended  Sign Out:     .  Discussed with Emergency Department Provider  ----------------------------------------------------------------------------------------------------  Chief Complaint: back pain, lethargy  History of Present Illness: Patient is a 64 year old Male.  64 yo M h/o HIV, CAD, HTN, Heart failure p/w R ear pain and Left back pain. He is falling asleep repeatedly and unable to maintain attention or give significant history.   Past Medical History:     . Hypertension     . Coronary Artery Disease      Examination: BP(155/104), Pulse(unknown), Blood Glucose(118)  Neuro Exam:    decreased attention/concentration requires repeated stimulation, moves arms and legs symmetrically, no drift, able to do some range of motion head with no complaints   Patient/Family was informed the Neurology Consult would occur via TeleHealth consult by way of interactive audio and video telecommunications and consented to receiving care in this manner.  Patient is being evaluated for possible acute neurologic impairment and high probability of imminent or life-threatening deterioration. I spent total of 30  minutes providing care to this patient, including time for face to face visit via telemedicine, review of medical records, imaging studies and discussion of findings with providers, the patient and/or family.   Dr Jordan Likes   [facility] [phone]  Case [id number]

## 2019-08-19 NOTE — Progress Notes (Signed)
PROGRESS NOTE    Douglas Conway  TGG:[id number] DOB: 1955/12/25 DOA: 08/18/2019 PCP: System, Pcp Not In    Assessment & Plan:   Active Problems:   Acute mastoiditis   Acute right mastoiditis and right otitis externa: Continue on IV vanco & cefepime. Continue on cipro ear drops.  ENT following and recs apprec. Reached out to ID for abxs recs    Hypertensive urgency: continue on carvedilol, lisinopril. Labetalol prn   HIV disease: continue on biktarvy and acyclovir   Hypothyroidism: continue on levothyroxine   Dyslipidemia: continue statin   CAD: continue on carvedilol, lisinopril, & statin  Somnolence: etiology unclear, possibly OSA. Will need an outpatient sleep study.    DVT prophylaxis: lovenox  Code Status:  Full  Family Communication:  Disposition Plan: likely d/c back home. Unlikely no barriers   Consultants:   ENT   Procedures:    Antimicrobials: vanco, cefepime, cipro gtt   Subjective: Pt c/o not syrup for his pancakes, sugar for oatmeal and neck pain.   Objective: Vitals:   08/19/19 0600 08/19/19 0615 08/19/19 0730 08/19/19 0756  BP: 132/76  (!) 162/93 (!) 153/83  Pulse: 94 77 81 88  Resp: 16   17  Temp:    98.3 F (36.8 C)  TempSrc:      SpO2: 99% 99% 95% 98%  Weight:      Height:        Intake/Output Summary (Last 24 hours) at 08/19/2019 0803 Last data filed at 08/19/2019 0240 Gross per 24 hour  Intake 800 ml  Output 900 ml  Net -100 ml   Filed Weights   08/18/19 2025  Weight: 86.2 kg    Examination:  General exam: Appears calm and comfortable  Respiratory system: diminished breath sounds b/l.  Cardiovascular system: S1 & S2 +. No rubs, gallops or clicks.  Gastrointestinal system: Abdomen is nondistended, soft and nontender.  Hypoactive bowel sounds heard. Central nervous system: Alert and oriented. Moves 4 extremities  Psychiatry: Agitated and frustrated       Data Reviewed: I have personally reviewed following labs and  imaging studies  CBC: Recent Labs  Lab 08/18/19 2230  WBC 8.7  NEUTROABS 4.6  HGB 13.5  HCT 39.7  MCV 90.6  PLT 261   Basic Metabolic Panel: Recent Labs  Lab 08/18/19 2230  NA 137  K 3.8  CL 104  CO2 25  GLUCOSE 114*  BUN 21  CREATININE 0.82  CALCIUM 8.9   GFR: Estimated Creatinine Clearance: 106.9 mL/min (by C-G formula based on SCr of 0.82 mg/dL). Liver Function Tests: Recent Labs  Lab 08/18/19 2230  AST 36  ALT 33  ALKPHOS 70  BILITOT 0.6  PROT 7.0  ALBUMIN 3.9   No results for input(s): LIPASE, AMYLASE in the last 168 hours. Recent Labs  Lab 08/18/19 2230  AMMONIA 44*   Coagulation Profile: No results for input(s): INR, PROTIME in the last 168 hours. Cardiac Enzymes: No results for input(s): CKTOTAL, CKMB, CKMBINDEX, TROPONINI in the last 168 hours. BNP (last 3 results) No results for input(s): PROBNP in the last 8760 hours. HbA1C: No results for input(s): HGBA1C in the last 72 hours. CBG: Recent Labs  Lab 08/18/19 2226  GLUCAP 118*   Lipid Profile: No results for input(s): CHOL, HDL, LDLCALC, TRIG, CHOLHDL, LDLDIRECT in the last 72 hours. Thyroid Function Tests: Recent Labs    08/18/19 2229  TSH 0.610   Anemia Panel: No results for input(s): VITAMINB12, FOLATE, FERRITIN, TIBC, IRON, RETICCTPCT  in the last 72 hours. Sepsis Labs: Recent Labs  Lab 08/18/19 2230  LATICACIDVEN 1.1    Recent Results (from the past 240 hour(s))  Culture, blood (Routine X 2) w Reflex to ID Panel     Status: None (Preliminary result)   Collection Time: 08/19/19 12:41 AM   Specimen: BLOOD  Result Value Ref Range Status   Specimen Description BLOOD LEFT FA  Final   Special Requests   Final    BOTTLES DRAWN AEROBIC AND ANAEROBIC Blood Culture results may not be optimal due to an inadequate volume of blood received in culture bottles   Culture   Final    NO GROWTH < 12 HOURS Performed at [facility], [address], [place], [place] [address]      Report Status PENDING  Incomplete  Culture, blood (Routine X 2) w Reflex to ID Panel     Status: None (Preliminary result)   Collection Time: 08/19/19 12:41 AM   Specimen: BLOOD  Result Value Ref Range Status   Specimen Description BLOOD RIGHT [facility]  Final   Special Requests   Final    BOTTLES DRAWN AEROBIC AND ANAEROBIC Blood Culture results may not be optimal due to an excessive volume of blood received in culture bottles   Culture   Final    NO GROWTH < 12 HOURS Performed at [facility], [address], [place], [place] [address]    Report Status PENDING  Incomplete  Respiratory Panel by RT PCR (Flu A&B, Covid) - Nasopharyngeal Swab     Status: None   Collection Time: 08/19/19 12:41 AM   Specimen: Nasopharyngeal Swab  Result Value Ref Range Status   SARS Coronavirus 2 by RT PCR NEGATIVE NEGATIVE Final    Comment: (NOTE) SARS-CoV-2 target nucleic acids are NOT DETECTED. The SARS-CoV-2 RNA is generally detectable in upper respiratoy specimens during the acute phase of infection. The lowest concentration of SARS-CoV-2 viral copies this assay can detect is 131 copies/mL. A negative result does not preclude SARS-Cov-2 infection and should not be used as the sole basis for treatment or other patient management decisions. A negative result may occur with  improper specimen collection/handling, submission of specimen other than nasopharyngeal swab, presence of viral mutation(s) within the areas targeted by this assay, and inadequate number of viral copies (<131 copies/mL). A negative result must be combined with clinical observations, patient history, and epidemiological information. The expected result is Negative. Fact Sheet for Patients:  PinkCheek.be Fact Sheet for Healthcare Providers:  GravelBags.it This test is not yet ap proved or cleared by the [place] FDA and  has been authorized for detection and/or  diagnosis of SARS-CoV-2 by FDA under an Emergency Use Authorization (EUA). This EUA will remain  in effect (meaning this test can be used) for the duration of the COVID-19 declaration under Section 564(b)(1) of the Act, 21 U.S.C. section 360bbb-3(b)(1), unless the authorization is terminated or revoked sooner.    Influenza A by PCR NEGATIVE NEGATIVE Final   Influenza B by PCR NEGATIVE NEGATIVE Final    Comment: (NOTE) The Xpert Xpress SARS-CoV-2/FLU/RSV assay is intended as an aid in  the diagnosis of influenza from Nasopharyngeal swab specimens and  should not be used as a sole basis for treatment. Nasal washings and  aspirates are unacceptable for Xpert Xpress SARS-CoV-2/FLU/RSV  testing. Fact Sheet for Patients: PinkCheek.be Fact Sheet for Healthcare Providers: GravelBags.it This test is not yet approved or cleared by the [place] and  has been authorized for detection and/or diagnosis of SARS-CoV-2 by  FDA under an Emergency Use Authorization (EUA). This EUA will remain  in effect (meaning this test can be used) for the duration of the  Covid-19 declaration under Section 564(b)(1) of the Act, 21  U.S.C. section 360bbb-3(b)(1), unless the authorization is  terminated or revoked. Performed at [facility], [address], [place], [place] [address]          Radiology Studies: CT Head Wo Contrast  Result Date: 08/18/2019 CLINICAL DATA:  Altered level of consciousness, lethargy, headache EXAM: CT HEAD WITHOUT CONTRAST TECHNIQUE: Contiguous axial images were obtained from the base of the skull through the vertex without intravenous contrast. COMPARISON:  09/06/2017 FINDINGS: Brain: No acute infarct or hemorrhage. Lateral ventricles and midline structures are unremarkable. No acute extra-axial fluid collections. No mass effect. Vascular: No hyperdense vessel or unexpected calcification. Skull: Normal.  Negative for fracture or focal lesion. Sinuses/Orbits: Circumferential mucosal thickening right maxillary sinus unchanged since prior exam consistent with chronic maxillary sinus disease. Remaining sinuses are now clear. Other: None. IMPRESSION: 1. Right maxillary sinus disease. 2. Otherwise no acute intracranial process. Electronically Signed   By: Sharlet Salina M.D.   On: 08/18/2019 22:27   DG Chest Port 1 View  Result Date: 08/18/2019 CLINICAL DATA:  Lethargy. EXAM: PORTABLE CHEST 1 VIEW COMPARISON:  Sep 05, 2017 FINDINGS: Mild, chronic appearing increased lung markings are seen without evidence of acute infiltrate, pleural effusion or pneumothorax. The cardiac silhouette is moderately enlarged. Chronic third and fourth left rib fractures are noted. IMPRESSION: Chronic appearing increased lung markings without evidence of acute or active cardiopulmonary disease. Electronically Signed   By: Aram Candela M.D.   On: 08/18/2019 22:49   MR BRAIN/IAC W WO CONTRAST  Result Date: 08/19/2019 CLINICAL DATA:  Initial evaluation for otalgia or effusion of ear. EXAM: MRI HEAD WITHOUT AND WITH CONTRAST TECHNIQUE: Multiplanar, multiecho pulse sequences of the brain and surrounding structures were obtained without and with intravenous contrast. CONTRAST:  7.7mL GADAVIST GADOBUTROL 1 MMOL/ML IV SOLN COMPARISON:  Prior head CT from 08/18/2019. FINDINGS: Brain: Examination moderately degraded by motion artifact. Cerebral volume within normal limits for age. Scattered T2/FLAIR hyperintensity within the periventricular deep white matter both cerebral hemispheres most consistent with chronic small vessel ischemic disease, mild in nature. No abnormal foci of restricted diffusion to suggest acute or subacute ischemia. Gray-white matter differentiation maintained. Few small remote right cerebellar infarcts noted. No other encephalomalacia to suggest chronic cortical infarction. No foci of susceptibility artifact to suggest  acute or chronic intracranial hemorrhage. No mass lesion, midline shift, or mass effect. Pituitary gland suprasellar region normal. Midline structures intact. No discernible abnormal enhancement within the brain. Thin section imaging through the skull base and internal auditory canals was performed. Seventh and eighth cranial nerves seen coursing normally into the IAC's. No cerebellar pontine angle mass. Inner ear structures including the vestibules, cochlea, and semi circular canals grossly within normal limits. Large right mastoid effusion with associated middle ear effusion no discernible spread of enhancement or infection into the otic capsule or intracranial contents. Additionally, diffuse swelling with enhancement seen about the right EAC, auricle, and pinna. 13 mm T2 hyperintense fluid collection just above the right EAC could reflect a small and/or developing abscess (series 8, image 9). On the left, a small left mastoid effusion noted as well. Vascular: Major intracranial vascular flow voids are maintained. Normal flow related signal seen throughout the major dural sinuses. Skull and upper  cervical spine: Craniocervical junction within normal limits. Bone marrow signal intensity normal. No other scalp soft tissue abnormality. Sinuses/Orbits: Globes and orbital soft tissues demonstrate no acute finding. Moderate mucosal thickening with associated air-fluid level noted within the right maxillary sinus. Additional mild scattered mucosal thickening noted within the sphenoid ethmoidal sinuses. Other: None. IMPRESSION: 1. Right mastoid and middle ear effusions, with associated swelling and enhancement involving the right EAC, auricle, and pinna. Constellation of findings suspicious for acute otomastoiditis with otitis externa. No visible spread of infection into the brain or cranial vault at this time. Superimposed 13 mm T2 hyperintense collection involving the right temporal scalp just above the right EAC as  above, which could reflect a small and/or developing abscess. 2. Additional small left mastoid effusion. 3. Acute right maxillary sinusitis. 4. Mild age-related cerebral atrophy with chronic small vessel ischemic disease. No other acute intracranial abnormality. Electronically Signed   By: Rise Mu M.D.   On: 08/19/2019 02:55        Scheduled Meds: . acyclovir  400 mg Oral 5 X Daily  . bictegravir-emtricitabine-tenofovir AF  1 tablet Oral Daily  . carvedilol  6.25 mg Oral BID  . cholecalciferol  5,000 Units Oral Daily  . ciprofloxacin-dexamethasone  3 drop Right EAR BID  . digoxin  125 mcg Oral Daily  . enoxaparin (LOVENOX) injection  40 mg Subcutaneous Q24H  . levothyroxine  175 mcg Oral QAC breakfast  . lisinopril  10 mg Oral Daily  . rosuvastatin  20 mg Oral Daily   Continuous Infusions: . sodium chloride 100 mL/hr at 08/19/19 0803  . ceFEPime (MAXIPIME) IV Stopped (08/19/19 0240)  . vancomycin    . vancomycin       LOS: 0 days    Time spent: 30 mins     Charise Killian, MD [facility] Pager [phone]  If 7PM-7AM, please contact night-coverage [email or web address] 08/19/2019, 8:03 AM

## 2019-08-19 NOTE — Progress Notes (Signed)
Pharmacy Antibiotic Note  Douglas Conway is a 64 y.o. male admitted on 08/18/2019 with sepsis.  Pharmacy has been consulted for cefepime dosing.  Plan: Will start cefepime 2g IV q8h per CrCl > 60 ml/min and will continue to monitor and adjust per changes in renal function  Height: 6\' 1"  (185.4 cm) Weight: 86.2 kg (190 lb) IBW/kg (Calculated) : 79.9  Temp (24hrs), Avg:98.6 F (37 C), Min:98.6 F (37 C), Max:98.6 F (37 C)  Recent Labs  Lab 08/18/19 2230  WBC 8.7  CREATININE 0.82  LATICACIDVEN 1.1    Estimated Creatinine Clearance: 106.9 mL/min (by C-G formula based on SCr of 0.82 mg/dL).    Allergies  Allergen Reactions  . Plavix [Clopidogrel Bisulfate] Rash     Thank you for allowing pharmacy to be a part of this patient's care.  10/18/19, PharmD, BCPS Clinical Pharmacist 08/19/2019 12:39 AM

## 2019-08-19 NOTE — ED Notes (Signed)
Pt complaint at this time using non rebreathe rmask at 10lpm.

## 2019-08-19 NOTE — Progress Notes (Signed)
Pharmacy Antibiotic Note  Douglas Conway is a 64 y.o. male admitted on 08/18/2019 with mastoiditis.  Pharmacy has been consulted for vanc/cefepime dosing.  Patient received vancomycin 2g IV x 1 dose in ED.   Plan: Start Vancomycin 1500 mg IV Q 12 hrs. Goal AUC 400-550. Expected AUC: 500 SCr used: 0.8 Css: 12.9   Cefepime has been switched to Unasyn 1.5mg  IV every 6 hours   Height: 6\' 1"  (185.4 cm) Weight: 86.2 kg (190 lb) IBW/kg (Calculated) : 79.9  Temp (24hrs), Avg:98.2 F (36.8 C), Min:97.7 F (36.5 C), Max:98.6 F (37 C)  Recent Labs  Lab 08/18/19 2230 08/19/19 0815  WBC 8.7 10.8*  CREATININE 0.82 0.79  LATICACIDVEN 1.1  --     Estimated Creatinine Clearance: 109.6 mL/min (by C-G formula based on SCr of 0.79 mg/dL).    Allergies  Allergen Reactions  . Plavix [Clopidogrel Bisulfate] Rash   Antibiotics This Admission 5/10 Cipro x 1 dose 5/10 Cefepime x 1 dose 5/10 Vancomycin >> 5/10 Unasyn>>   Thank you for allowing pharmacy to be a part of this patient's care.  7/10, PharmD, BCPS Clinical Pharmacist 08/19/2019 1:36 PM

## 2019-08-19 NOTE — H&P (Addendum)
[place] at [facility]   PATIENT NAME: Douglas Conway    MR#:  [id number]  DATE OF BIRTH:  [birth year]  DATE OF ADMISSION:  08/18/2019  PRIMARY CARE PHYSICIAN: System, Pcp Not In   REQUESTING/REFERRING PHYSICIAN: Paschal Dopp, MD CHIEF COMPLAINT:   Chief Complaint  Patient presents with  . Back Pain    HISTORY OF PRESENT ILLNESS:  Douglas Conway  is a 64 y.o. Caucasian male with a known history of HIV disease, hypertension and systolic CHF, presented to the emergency room with acute onset of altered mental status with associated right earache as well as back pain.  The patient was very somnolent and difficult to arouse during my interview and therefore no history could be obtained from him.  His right ear pain was radiating to his right jaw.  He did not have any reported headache.  He tried to get wax out of his ear.  He has been taking his HIV medications on a regular basis.  Upon presentation to the emergency room, heart rate was 104 and otherwise vital signs were within normal.  Blood pressure was 155/104 later on with respiratory to 21.  Labs revealed unremarkable CBC and CMP.  High-sensitivity troponin I was 43 and later 41.  Urine drug screen was positive for amphetamine ABG showed a PO2 of 64 was otherwise unremarkable..  Chest x-ray showed chronic appearing increased lung markings without evidence for acute cardiopulmonary disease.  Brain MRI revealed right mastoid and middle ear effusions and acute otitis externa and otomastoiditis, small left mastoid effusion right maxillary sinusitis with middle-aged cerebral atrophy with chronic small vessel ischemic disease.  The patient was given IV cefepime and Cipro and IV normal saline 500 mL bolus.  He will be admitted to medical monitored bed for further evaluation and management. PAST MEDICAL HISTORY:   Past Medical History:  Diagnosis Date  . 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 Chronic systolic congestive heart failure  (HCC) 05/14/2019  . Coronary artery disease   . HIV (human immunodeficiency virus infection) (HCC)   . HIV disease (HCC) 06/28/2018  . Hypertension 06/28/2018  . Lateral epicondylitis 01/07/2019  . Other specified hypothyroidism 01/07/2019  . Thyroid disease     PAST SURGICAL HISTORY:   Past Surgical History:  Procedure Laterality Date  . CORONARY ANGIOPLASTY WITH STENT PLACEMENT      SOCIAL HISTORY:   Social History   Tobacco Use  . Smoking status: Current Some Day Smoker    Packs/day: 0.10    Start date: 01/07/1968  . Smokeless tobacco: Never Used  . Tobacco comment: cutting back  Substance Use Topics  . Alcohol use: Yes    Alcohol/week: 12.0 standard drinks    Types: 12 Cans of beer per week    FAMILY HISTORY:  No family history on file.  DRUG ALLERGIES:   Allergies  Allergen Reactions  . Plavix [Clopidogrel Bisulfate] Rash    REVIEW OF SYSTEMS:   ROS As per history of present illness. All pertinent systems were reviewed above. Constitutional,  HEENT, cardiovascular, respiratory, GI, GU, musculoskeletal, neuro, psychiatric, endocrine,  integumentary and hematologic systems were reviewed and are otherwise  negative/unremarkable except for positive findings mentioned above in the HPI.   MEDICATIONS AT HOME:   Prior to Admission medications   Medication Sig Start Date End Date Taking? Authorizing Provider  BIKTARVY 50-200-25 MG TABS tablet TAKE 1 TABLET BY MOUTH DAILY 07/04/19  Yes Veryl Speak, FNP  carvedilol (COREG) 6.25 MG  tablet Take 1 tablet (6.25 mg total) by mouth 2 (two) times daily. 05/23/19  Yes Lars Masson, MD  digoxin (LANOXIN) 0.125 MG tablet Take 1 tablet (125 mcg total) by mouth daily. 05/23/19  Yes Lars Masson, MD  levothyroxine (SYNTHROID) 175 MCG tablet Take 1 tablet (175 mcg total) by mouth daily before breakfast. 06/13/19  Yes Lars Masson, MD  lisinopril (ZESTRIL) 10 MG tablet Take 1 tablet (10 mg total) by mouth daily.  05/23/19  Yes Lars Masson, MD  rosuvastatin (CRESTOR) 20 MG tablet Take 1 tablet (20 mg total) by mouth daily. 06/13/19  Yes Lars Masson, MD  acyclovir (ZOVIRAX) 400 MG tablet Take 1 tablet (400 mg total) by mouth 5 (five) times daily. 05/14/19   Veryl Speak, FNP  Cholecalciferol 125 MCG (5000 UT) capsule Take 1 capsule (5,000 Units total) by mouth daily. 06/13/19   Lars Masson, MD      VITAL SIGNS:  Blood pressure (!) 154/71, pulse 85, temperature 98.6 F (37 C), temperature source Oral, resp. rate 18, height 6\' 1"  (1.854 m), weight 86.2 kg, SpO2 97 %.  PHYSICAL EXAMINATION:  Physical Exam  GENERAL:  64 y.o.-year-old Caucasian male patient lying in the bed with no acute distress.  EYES: Pupils equal, round, reactive to light and accommodation. No scleral icterus. Extraocular muscles intact.  HEENT: Head atraumatic, normocephalic. Oropharynx and nasopharynx clear.  NECK:  Supple, no jugular venous distention. No thyroid enlargement, no tenderness.  LUNGS: Normal breath sounds bilaterally, no wheezing, rales,rhonchi or crepitation. No use of accessory muscles of respiration.  CARDIOVASCULAR: Regular rate and rhythm, S1, S2 normal. No murmurs, rubs, or gallops.  ABDOMEN: Soft, nondistended, nontender. Bowel sounds present. No organomegaly or mass.  EXTREMITIES: No pedal edema, cyanosis, or clubbing.  NEUROLOGIC: Cranial nerves II through XII are intact. Muscle strength 5/5 in all extremities. Sensation intact. Gait not checked.  PSYCHIATRIC: The patient is alert and oriented x 3.  Normal affect and good eye contact. SKIN: No obvious rash, lesion, or ulcer.   LABORATORY PANEL:   CBC Recent Labs  Lab 08/18/19 2230  WBC 8.7  HGB 13.5  HCT 39.7  PLT 261   ------------------------------------------------------------------------------------------------------------------  Chemistries  Recent Labs  Lab 08/18/19 2230  NA 137  K 3.8  CL 104  CO2 25  GLUCOSE 114*   BUN 21  CREATININE 0.82  CALCIUM 8.9  AST 36  ALT 33  ALKPHOS 70  BILITOT 0.6   ------------------------------------------------------------------------------------------------------------------  Cardiac Enzymes No results for input(s): TROPONINI in the last 168 hours. ------------------------------------------------------------------------------------------------------------------  RADIOLOGY:  CT Head Wo Contrast  Result Date: 08/18/2019 CLINICAL DATA:  Altered level of consciousness, lethargy, headache EXAM: CT HEAD WITHOUT CONTRAST TECHNIQUE: Contiguous axial images were obtained from the base of the skull through the vertex without intravenous contrast. COMPARISON:  09/06/2017 FINDINGS: Brain: No acute infarct or hemorrhage. Lateral ventricles and midline structures are unremarkable. No acute extra-axial fluid collections. No mass effect. Vascular: No hyperdense vessel or unexpected calcification. Skull: Normal. Negative for fracture or focal lesion. Sinuses/Orbits: Circumferential mucosal thickening right maxillary sinus unchanged since prior exam consistent with chronic maxillary sinus disease. Remaining sinuses are now clear. Other: None. IMPRESSION: 1. Right maxillary sinus disease. 2. Otherwise no acute intracranial process. Electronically Signed   By: 09/08/2017 M.D.   On: 08/18/2019 22:27   DG Chest Port 1 View  Result Date: 08/18/2019 CLINICAL DATA:  Lethargy. EXAM: PORTABLE CHEST 1 VIEW COMPARISON:  Sep 05, 2017  FINDINGS: Mild, chronic appearing increased lung markings are seen without evidence of acute infiltrate, pleural effusion or pneumothorax. The cardiac silhouette is moderately enlarged. Chronic third and fourth left rib fractures are noted. IMPRESSION: Chronic appearing increased lung markings without evidence of acute or active cardiopulmonary disease. Electronically Signed   By: Aram Candela M.D.   On: 08/18/2019 22:49   MR BRAIN/IAC W WO CONTRAST  Result  Date: 08/19/2019 CLINICAL DATA:  Initial evaluation for otalgia or effusion of ear. EXAM: MRI HEAD WITHOUT AND WITH CONTRAST TECHNIQUE: Multiplanar, multiecho pulse sequences of the brain and surrounding structures were obtained without and with intravenous contrast. CONTRAST:  7.40mL GADAVIST GADOBUTROL 1 MMOL/ML IV SOLN COMPARISON:  Prior head CT from 08/18/2019. FINDINGS: Brain: Examination moderately degraded by motion artifact. Cerebral volume within normal limits for age. Scattered T2/FLAIR hyperintensity within the periventricular deep white matter both cerebral hemispheres most consistent with chronic small vessel ischemic disease, mild in nature. No abnormal foci of restricted diffusion to suggest acute or subacute ischemia. Gray-white matter differentiation maintained. Few small remote right cerebellar infarcts noted. No other encephalomalacia to suggest chronic cortical infarction. No foci of susceptibility artifact to suggest acute or chronic intracranial hemorrhage. No mass lesion, midline shift, or mass effect. Pituitary gland suprasellar region normal. Midline structures intact. No discernible abnormal enhancement within the brain. Thin section imaging through the skull base and internal auditory canals was performed. Seventh and eighth cranial nerves seen coursing normally into the IAC's. No cerebellar pontine angle mass. Inner ear structures including the vestibules, cochlea, and semi circular canals grossly within normal limits. Large right mastoid effusion with associated middle ear effusion no discernible spread of enhancement or infection into the otic capsule or intracranial contents. Additionally, diffuse swelling with enhancement seen about the right EAC, auricle, and pinna. 13 mm T2 hyperintense fluid collection just above the right EAC could reflect a small and/or developing abscess (series 8, image 9). On the left, a small left mastoid effusion noted as well. Vascular: Major intracranial  vascular flow voids are maintained. Normal flow related signal seen throughout the major dural sinuses. Skull and upper cervical spine: Craniocervical junction within normal limits. Bone marrow signal intensity normal. No other scalp soft tissue abnormality. Sinuses/Orbits: Globes and orbital soft tissues demonstrate no acute finding. Moderate mucosal thickening with associated air-fluid level noted within the right maxillary sinus. Additional mild scattered mucosal thickening noted within the sphenoid ethmoidal sinuses. Other: None. IMPRESSION: 1. Right mastoid and middle ear effusions, with associated swelling and enhancement involving the right EAC, auricle, and pinna. Constellation of findings suspicious for acute otomastoiditis with otitis externa. No visible spread of infection into the brain or cranial vault at this time. Superimposed 13 mm T2 hyperintense collection involving the right temporal scalp just above the right EAC as above, which could reflect a small and/or developing abscess. 2. Additional small left mastoid effusion. 3. Acute right maxillary sinusitis. 4. Mild age-related cerebral atrophy with chronic small vessel ischemic disease. No other acute intracranial abnormality. Electronically Signed   By: Rise Mu M.D.   On: 08/19/2019 02:55      IMPRESSION AND PLAN:   1.  Acute right mastoiditis and right otitis externa. -The patient will be admitted to a medical monitored bed. -Continue antibiotic therapy with IV vancomycin and cefepime. -We will place him on Acetasol and Cipro HC otic gtt. for his otitis externa.   -Pain management will be provided -An ENT consultation will be obtained. -I notified Dr. Jenne Campus about the  patient.  2.  Hypertensive urgency.  This could be contributing to altered mental status in addition to #1. -The patient will be placed on as needed IV labetalol and he will be continued on his antihypertensives.  3.  HIV disease. -We will continue  antiretroviral therapy and prophylactic therapy.  4.  Hypothyroidism. -We will obtain TSH and continue Synthroid.  5.  Dyslipidemia. -We will continue statin therapy.  6.  Coronary artery disease. -We will continue beta-blocker therapy and statin therapy as well as ACE inhibitor therapy.  7.  DVT prophylaxis. -Subcutaneous Lovenox.  All the records are reviewed and case discussed with ED provider. The plan of care was discussed in details with the patient (and family). I answered all questions. The patient agreed to proceed with the above mentioned plan. Further management will depend upon hospital course.   CODE STATUS: Full code  Status is: Inpatient  Remains inpatient appropriate because:Altered mental status, Ongoing diagnostic testing needed not appropriate for outpatient work up, Unsafe d/c plan, IV treatments appropriate due to intensity of illness or inability to take PO and Inpatient level of care appropriate due to severity of illness   Dispo: The patient is from: Home              Anticipated d/c is to: Home              Anticipated d/c date is: 2 days              Patient currently is not medically stable to d/c.   TOTAL TIME TAKING CARE OF THIS PATIENT: 55 minutes.    Christel Mormon M.D on 08/19/2019 at 4:43 AM  [facility]   From 7 PM-7 AM, contact night-coverage [email or web address]  CC: Primary care physician; System, Pcp Not In   Note: This dictation was prepared with Dragon dictation along with smaller phrase technology. Any transcriptional errors that result from this process are unintentional.

## 2019-08-19 NOTE — ED Notes (Signed)
Pt to MRI with RN assist. Per dr. Fanny Bien pt must have an RN assist to MRI.

## 2019-08-20 LAB — BASIC METABOLIC PANEL
Anion gap: 5 (ref 5–15)
BUN: 14 mg/dL (ref 8–23)
CO2: 25 mmol/L (ref 22–32)
Calcium: 8.8 mg/dL — ABNORMAL LOW (ref 8.9–10.3)
Chloride: 109 mmol/L (ref 98–111)
Creatinine, Ser: 0.88 mg/dL (ref 0.61–1.24)
GFR calc Af Amer: >60 mL/min (ref >60)
GFR calc non Af Amer: >60 mL/min (ref >60)
Glucose, Bld: 104 mg/dL — ABNORMAL HIGH (ref 70–99)
Potassium: 4.1 mmol/L (ref 3.5–5.1)
Sodium: 139 mmol/L (ref 135–145)

## 2019-08-20 LAB — CBC
HCT: 42 % (ref 39.0–52.0)
Hemoglobin: 14.4 g/dL (ref 13.0–17.0)
MCH: 31 pg (ref 26.0–34.0)
MCHC: 34.3 g/dL (ref 30.0–36.0)
MCV: 90.5 fL (ref 80.0–100.0)
Platelets: 272 10*3/uL (ref 150–400)
RBC: 4.64 MIL/uL (ref 4.22–5.81)
RDW: 12.8 % (ref 11.5–15.5)
WBC: 8.3 10*3/uL (ref 4.0–10.5)
nRBC: 0 % (ref 0.0–0.2)

## 2019-08-20 NOTE — Discharge Summary (Signed)
Physician Discharge Summary  Nikolai Wilczak AUQ:[id number] DOB: December 15, 1955 DOA: 08/18/2019  PCP: System, Pcp Not In  Admit date: 08/18/2019 Discharge date: 08/20/2019  Admitted From: home  Disposition:  Left AMA   Recommendations for Outpatient Follow-up:  1. Pt left AMA   Home Health: no  Equipment/Devices:  Discharge Condition:guarded CODE STATUS: full  Diet recommendation: Heart Healthy  Brief/Interim Summary: HPI was taken from Dr. Arville Care: Crofford  is a 64 y.o. Caucasian male with a known history of HIV disease, hypertension and systolic CHF, presented to the emergency room with acute onset of altered mental status with associated right earache as well as back pain.  The patient was very somnolent and difficult to arouse during my interview and therefore no history could be obtained from him.  His right ear pain was radiating to his right jaw.  He did not have any reported headache.  He tried to get wax out of his ear.  He has been taking his HIV medications on a regular basis.  Upon presentation to the emergency room, heart rate was 104 and otherwise vital signs were within normal.  Blood pressure was 155/104 later on with respiratory to 21.  Labs revealed unremarkable CBC and CMP.  High-sensitivity troponin I was 43 and later 41.  Urine drug screen was positive for amphetamine ABG showed a PO2 of 64 was otherwise unremarkable..  Chest x-ray showed chronic appearing increased lung markings without evidence for acute cardiopulmonary disease.  Brain MRI revealed right mastoid and middle ear effusions and acute otitis externa and otomastoiditis, small left mastoid effusion right maxillary sinusitis with middle-aged cerebral atrophy with chronic small vessel ischemic disease.  The patient was given IV cefepime and Cipro and IV normal saline 500 mL bolus.  He will be admitted to medical monitored bed for further evaluation and management  Hospital Course from Dr. Wilfred Lacy 08/19/19: Pt was found to  have acute right mastoiditis and otitis externa and was started on abxs. Pt was initially started on IV vanco, cefepime and cipro ear drops. After speaking w/ ID via phone (Dr. Drue Second), they recommended switching the pt to IV unasyn instead of cefepime and they would see the pt today (08/20/19). Unfortunately, pt decided to leave AMA. Pt left prior to me being able to see the pt.  Discharge Diagnoses:  Active Problems:   Acute mastoiditis  Acute right mastoiditis and right otitis externa: Continue on IV vanco & unasyn as per ID. Continue on cipro ear drops.  ENT following and recs apprec. ID consulted  Hypertensive urgency: continue on carvedilol, lisinopril. Labetalol prn   HIV disease: continue on biktarvy and acyclovir   Hypothyroidism: continue on levothyroxine   Dyslipidemia: continue statin   CAD: continue on carvedilol, lisinopril, & statin  Somnolence: etiology unclear, possibly OSA. Will need an outpatient sleep study.   Discharge Instructions Pt left AMA     Consultations:  ENT  ID   Procedures/Studies: CT Head Wo Contrast  Result Date: 08/18/2019 CLINICAL DATA:  Altered level of consciousness, lethargy, headache EXAM: CT HEAD WITHOUT CONTRAST TECHNIQUE: Contiguous axial images were obtained from the base of the skull through the vertex without intravenous contrast. COMPARISON:  09/06/2017 FINDINGS: Brain: No acute infarct or hemorrhage. Lateral ventricles and midline structures are unremarkable. No acute extra-axial fluid collections. No mass effect. Vascular: No hyperdense vessel or unexpected calcification. Skull: Normal. Negative for fracture or focal lesion. Sinuses/Orbits: Circumferential mucosal thickening right maxillary sinus unchanged since prior exam consistent with chronic maxillary sinus  disease. Remaining sinuses are now clear. Other: None. IMPRESSION: 1. Right maxillary sinus disease. 2. Otherwise no acute intracranial process. Electronically Signed   By:  Sharlet Salina M.D.   On: 08/18/2019 22:27   DG Chest Port 1 View  Result Date: 08/18/2019 CLINICAL DATA:  Lethargy. EXAM: PORTABLE CHEST 1 VIEW COMPARISON:  Sep 05, 2017 FINDINGS: Mild, chronic appearing increased lung markings are seen without evidence of acute infiltrate, pleural effusion or pneumothorax. The cardiac silhouette is moderately enlarged. Chronic third and fourth left rib fractures are noted. IMPRESSION: Chronic appearing increased lung markings without evidence of acute or active cardiopulmonary disease. Electronically Signed   By: Aram Candela M.D.   On: 08/18/2019 22:49   MR BRAIN/IAC W WO CONTRAST  Result Date: 08/19/2019 CLINICAL DATA:  Initial evaluation for otalgia or effusion of ear. EXAM: MRI HEAD WITHOUT AND WITH CONTRAST TECHNIQUE: Multiplanar, multiecho pulse sequences of the brain and surrounding structures were obtained without and with intravenous contrast. CONTRAST:  7.101mL GADAVIST GADOBUTROL 1 MMOL/ML IV SOLN COMPARISON:  Prior head CT from 08/18/2019. FINDINGS: Brain: Examination moderately degraded by motion artifact. Cerebral volume within normal limits for age. Scattered T2/FLAIR hyperintensity within the periventricular deep white matter both cerebral hemispheres most consistent with chronic small vessel ischemic disease, mild in nature. No abnormal foci of restricted diffusion to suggest acute or subacute ischemia. Gray-white matter differentiation maintained. Few small remote right cerebellar infarcts noted. No other encephalomalacia to suggest chronic cortical infarction. No foci of susceptibility artifact to suggest acute or chronic intracranial hemorrhage. No mass lesion, midline shift, or mass effect. Pituitary gland suprasellar region normal. Midline structures intact. No discernible abnormal enhancement within the brain. Thin section imaging through the skull base and internal auditory canals was performed. Seventh and eighth cranial nerves seen coursing  normally into the IAC's. No cerebellar pontine angle mass. Inner ear structures including the vestibules, cochlea, and semi circular canals grossly within normal limits. Large right mastoid effusion with associated middle ear effusion no discernible spread of enhancement or infection into the otic capsule or intracranial contents. Additionally, diffuse swelling with enhancement seen about the right EAC, auricle, and pinna. 13 mm T2 hyperintense fluid collection just above the right EAC could reflect a small and/or developing abscess (series 8, image 9). On the left, a small left mastoid effusion noted as well. Vascular: Major intracranial vascular flow voids are maintained. Normal flow related signal seen throughout the major dural sinuses. Skull and upper cervical spine: Craniocervical junction within normal limits. Bone marrow signal intensity normal. No other scalp soft tissue abnormality. Sinuses/Orbits: Globes and orbital soft tissues demonstrate no acute finding. Moderate mucosal thickening with associated air-fluid level noted within the right maxillary sinus. Additional mild scattered mucosal thickening noted within the sphenoid ethmoidal sinuses. Other: None. IMPRESSION: 1. Right mastoid and middle ear effusions, with associated swelling and enhancement involving the right EAC, auricle, and pinna. Constellation of findings suspicious for acute otomastoiditis with otitis externa. No visible spread of infection into the brain or cranial vault at this time. Superimposed 13 mm T2 hyperintense collection involving the right temporal scalp just above the right EAC as above, which could reflect a small and/or developing abscess. 2. Additional small left mastoid effusion. 3. Acute right maxillary sinusitis. 4. Mild age-related cerebral atrophy with chronic small vessel ischemic disease. No other acute intracranial abnormality. Electronically Signed   By: Rise Mu M.D.   On: 08/19/2019 02:55        Subjective: Pt left AMA prior  to me seeing the pt.    Discharge Exam: Vitals:   08/20/19 0319 08/20/19 0818  BP: 121/80 (!) 141/93  Pulse: 84 90  Resp: 20 18  Temp: 98.3 F (36.8 C) 97.7 F (36.5 C)  SpO2: 96% 97%   Vitals:   08/19/19 1400 08/19/19 2025 08/20/19 0319 08/20/19 0818  BP:  122/80 121/80 (!) 141/93  Pulse:  80 84 90  Resp: 18 20 20 18   Temp:  98.3 F (36.8 C) 98.3 F (36.8 C) 97.7 F (36.5 C)  TempSrc:    Oral  SpO2:  94% 96% 97%  Weight:      Height:        No PE was done as pt left AMA prior to me seeing the pt.   The results of significant diagnostics from this hospitalization (including imaging, microbiology, ancillary and laboratory) are listed below for reference.     Microbiology: Recent Results (from the past 240 hour(s))  Culture, blood (Routine X 2) w Reflex to ID Panel     Status: None (Preliminary result)   Collection Time: 08/19/19 12:41 AM   Specimen: BLOOD  Result Value Ref Range Status   Specimen Description BLOOD LEFT FA  Final   Special Requests   Final    BOTTLES DRAWN AEROBIC AND ANAEROBIC Blood Culture results may not be optimal due to an inadequate volume of blood received in culture bottles   Culture   Final    NO GROWTH 1 DAY Performed at [facility], [address], [place], [place] [address]    Report Status PENDING  Incomplete  Culture, blood (Routine X 2) w Reflex to ID Panel     Status: None (Preliminary result)   Collection Time: 08/19/19 12:41 AM   Specimen: BLOOD  Result Value Ref Range Status   Specimen Description BLOOD RIGHT [facility]  Final   Special Requests   Final    BOTTLES DRAWN AEROBIC AND ANAEROBIC Blood Culture results may not be optimal due to an excessive volume of blood received in culture bottles   Culture   Final    NO GROWTH 1 DAY Performed at [facility], [address], [place], [place] [address]    Report Status PENDING  Incomplete  Respiratory Panel by RT PCR (Flu  A&B, Covid) - Nasopharyngeal Swab     Status: None   Collection Time: 08/19/19 12:41 AM   Specimen: Nasopharyngeal Swab  Result Value Ref Range Status   SARS Coronavirus 2 by RT PCR NEGATIVE NEGATIVE Final    Comment: (NOTE) SARS-CoV-2 target nucleic acids are NOT DETECTED. The SARS-CoV-2 RNA is generally detectable in upper respiratoy specimens during the acute phase of infection. The lowest concentration of SARS-CoV-2 viral copies this assay can detect is 131 copies/mL. A negative result does not preclude SARS-Cov-2 infection and should not be used as the sole basis for treatment or other patient management decisions. A negative result may occur with  improper specimen collection/handling, submission of specimen other than nasopharyngeal swab, presence of viral mutation(s) within the areas targeted by this assay, and inadequate number of viral copies (<131 copies/mL). A negative result must be combined with clinical observations, patient history, and epidemiological information. The expected result is Negative. Fact Sheet for Patients:  PinkCheek.be Fact Sheet for Healthcare Providers:  GravelBags.it This test is not yet ap proved or cleared by the [place] FDA and  has been authorized for detection and/or diagnosis of SARS-CoV-2 by FDA under an Emergency Use  Authorization (EUA). This EUA will remain  in effect (meaning this test can be used) for the duration of the COVID-19 declaration under Section 564(b)(1) of the Act, 21 U.S.C. section 360bbb-3(b)(1), unless the authorization is terminated or revoked sooner.    Influenza A by PCR NEGATIVE NEGATIVE Final   Influenza B by PCR NEGATIVE NEGATIVE Final    Comment: (NOTE) The Xpert Xpress SARS-CoV-2/FLU/RSV assay is intended as an aid in  the diagnosis of influenza from Nasopharyngeal swab specimens and  should not be used as a sole basis for treatment. Nasal washings  and  aspirates are unacceptable for Xpert Xpress SARS-CoV-2/FLU/RSV  testing. Fact Sheet for Patients: https://www.moore.com/ Fact Sheet for Healthcare Providers: https://www.young.biz/ This test is not yet approved or cleared by the [place] FDA and  has been authorized for detection and/or diagnosis of SARS-CoV-2 by  FDA under an Emergency Use Authorization (EUA). This EUA will remain  in effect (meaning this test can be used) for the duration of the  Covid-19 declaration under Section 564(b)(1) of the Act, 21  U.S.C. section 360bbb-3(b)(1), unless the authorization is  terminated or revoked. Performed at [facility], [address] [place], [place] [address]      Labs: BNP (last 3 results) No results for input(s): BNP in the last 8760 hours. Basic Metabolic Panel: Recent Labs  Lab 08/18/19 2230 08/19/19 0815 08/20/19 0622  NA 137 137 139  K 3.8 4.0 4.1  CL 104 105 109  CO2 25 26 25   GLUCOSE 114* 133* 104*  BUN 21 15 14   CREATININE 0.82 0.79 0.88  CALCIUM 8.9 8.3* 8.8*   Liver Function Tests: Recent Labs  Lab 08/18/19 2230  AST 36  ALT 33  ALKPHOS 70  BILITOT 0.6  PROT 7.0  ALBUMIN 3.9   No results for input(s): LIPASE, AMYLASE in the last 168 hours. Recent Labs  Lab 08/18/19 2230  AMMONIA 44*   CBC: Recent Labs  Lab 08/18/19 2230 08/19/19 0815 08/20/19 0622  WBC 8.7 10.8* 8.3  NEUTROABS 4.6  --   --   HGB 13.5 14.9 14.4  HCT 39.7 44.2 42.0  MCV 90.6 92.5 90.5  PLT 261 273 272   Cardiac Enzymes: No results for input(s): CKTOTAL, CKMB, CKMBINDEX, TROPONINI in the last 168 hours. BNP: Invalid input(s): POCBNP CBG: Recent Labs  Lab 08/18/19 2226  GLUCAP 118*   D-Dimer No results for input(s): DDIMER in the last 72 hours. Hgb A1c No results for input(s): HGBA1C in the last 72 hours. Lipid Profile No results for input(s): CHOL, HDL, LDLCALC, TRIG, CHOLHDL, LDLDIRECT in the last 72  hours. Thyroid function studies Recent Labs    08/18/19 2229  TSH 0.610   Anemia work up No results for input(s): VITAMINB12, FOLATE, FERRITIN, TIBC, IRON, RETICCTPCT in the last 72 hours. Urinalysis    Component Value Date/Time   COLORURINE YELLOW (A) 08/19/2019 0042   APPEARANCEUR CLEAR (A) 08/19/2019 0042   LABSPEC 1.015 08/19/2019 0042   PHURINE 6.0 08/19/2019 0042   GLUCOSEU NEGATIVE 08/19/2019 0042   HGBUR NEGATIVE 08/19/2019 0042   BILIRUBINUR NEGATIVE 08/19/2019 0042   KETONESUR NEGATIVE 08/19/2019 0042   PROTEINUR NEGATIVE 08/19/2019 0042   NITRITE NEGATIVE 08/19/2019 0042   LEUKOCYTESUR NEGATIVE 08/19/2019 0042   Sepsis Labs Invalid input(s): PROCALCITONIN,  WBC,  LACTICIDVEN Microbiology Recent Results (from the past 240 hour(s))  Culture, blood (Routine X 2) w Reflex to ID Panel     Status: None (Preliminary result)   Collection  Time: 08/19/19 12:41 AM   Specimen: BLOOD  Result Value Ref Range Status   Specimen Description BLOOD LEFT FA  Final   Special Requests   Final    BOTTLES DRAWN AEROBIC AND ANAEROBIC Blood Culture results may not be optimal due to an inadequate volume of blood received in culture bottles   Culture   Final    NO GROWTH 1 DAY Performed at [facility], [address] [place], [place] [address]    Report Status PENDING  Incomplete  Culture, blood (Routine X 2) w Reflex to ID Panel     Status: None (Preliminary result)   Collection Time: 08/19/19 12:41 AM   Specimen: BLOOD  Result Value Ref Range Status   Specimen Description BLOOD RIGHT [facility]  Final   Special Requests   Final    BOTTLES DRAWN AEROBIC AND ANAEROBIC Blood Culture results may not be optimal due to an excessive volume of blood received in culture bottles   Culture   Final    NO GROWTH 1 DAY Performed at [facility], [address] [place], [place] [address]    Report Status PENDING  Incomplete  Respiratory Panel by RT PCR (Flu A&B, Covid) -  Nasopharyngeal Swab     Status: None   Collection Time: 08/19/19 12:41 AM   Specimen: Nasopharyngeal Swab  Result Value Ref Range Status   SARS Coronavirus 2 by RT PCR NEGATIVE NEGATIVE Final    Comment: (NOTE) SARS-CoV-2 target nucleic acids are NOT DETECTED. The SARS-CoV-2 RNA is generally detectable in upper respiratoy specimens during the acute phase of infection. The lowest concentration of SARS-CoV-2 viral copies this assay can detect is 131 copies/mL. A negative result does not preclude SARS-Cov-2 infection and should not be used as the sole basis for treatment or other patient management decisions. A negative result may occur with  improper specimen collection/handling, submission of specimen other than nasopharyngeal swab, presence of viral mutation(s) within the areas targeted by this assay, and inadequate number of viral copies (<131 copies/mL). A negative result must be combined with clinical observations, patient history, and epidemiological information. The expected result is Negative. Fact Sheet for Patients:  https://www.moore.com/ Fact Sheet for Healthcare Providers:  https://www.young.biz/ This test is not yet ap proved or cleared by the [place] FDA and  has been authorized for detection and/or diagnosis of SARS-CoV-2 by FDA under an Emergency Use Authorization (EUA). This EUA will remain  in effect (meaning this test can be used) for the duration of the COVID-19 declaration under Section 564(b)(1) of the Act, 21 U.S.C. section 360bbb-3(b)(1), unless the authorization is terminated or revoked sooner.    Influenza A by PCR NEGATIVE NEGATIVE Final   Influenza B by PCR NEGATIVE NEGATIVE Final    Comment: (NOTE) The Xpert Xpress SARS-CoV-2/FLU/RSV assay is intended as an aid in  the diagnosis of influenza from Nasopharyngeal swab specimens and  should not be used as a sole basis for treatment. Nasal washings and  aspirates  are unacceptable for Xpert Xpress SARS-CoV-2/FLU/RSV  testing. Fact Sheet for Patients: https://www.moore.com/ Fact Sheet for Healthcare Providers: https://www.young.biz/ This test is not yet approved or cleared by the [place] FDA and  has been authorized for detection and/or diagnosis of SARS-CoV-2 by  FDA under an Emergency Use Authorization (EUA). This EUA will remain  in effect (meaning this test can be used) for the duration of the  Covid-19 declaration under Section 564(b)(1) of the Act, 21  U.S.C. section 360bbb-3(b)(1), unless  the authorization is  terminated or revoked. Performed at [facility], [address], [place], [place] [address]      Time coordinating discharge: Over 30 minutes  SIGNED:   Charise Killian, MD  [facility] 08/20/2019, 1:58 PM Pager   If 7PM-7AM, please contact night-coverage [email or web address]

## 2019-08-20 NOTE — Progress Notes (Signed)
Patient is alert and oriented. He is anxious about the state of his house and everything he needs to do. He said that he "was not expecting this to turn into a 3 day stay." Patient signed AMA paper. He is aware that the doctor advised against him leaving. IVs were removed and patient walked to the exit.

## 2019-08-21 ENCOUNTER — Telehealth: Payer: Self-pay

## 2019-08-21 ENCOUNTER — Ambulatory Visit: Admitting: Cardiology
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
## 2019-08-21 NOTE — Telephone Encounter (Signed)
Patient called office today to follow up on recent ED admission this past weekend. States that he was seen for a bad ear infection and is requiring antibiotics. Would like for Douglas Eke, FNP to prescribe antibiotics to avoid infection getting worse.  Patient does not have a PCP and does not want to go back to ED.  Douglas Conway, [place]

## 2019-08-22 MED ORDER — AMOXICILLIN-POT CLAVULANATE 875-125 MG PO TABS: 1.0000 | ORAL_TABLET | Freq: Two times a day (BID) | ORAL | 0 refills | Status: DC

## 2019-08-22 NOTE — Addendum Note (Signed)
Addended by: Jeanine Luz D on: 08/22/2019 08:59 AM   Modules accepted: Orders

## 2019-08-22 NOTE — Telephone Encounter (Signed)
Augmentin sent to [facility]. Will need follow up if symptoms do not improve.

## 2019-08-23 ENCOUNTER — Telehealth: Payer: Self-pay

## 2019-08-23 NOTE — Telephone Encounter (Signed)
COVID-19 Pre-Screening Questions:08/23/19  Do you currently have a fever (>100 F), chills or unexplained body aches? NO  Are you currently experiencing new cough, shortness of breath, sore throat, runny nose? NO .  . Have you recently travelled outside the state of [place] in the last 14 days?NO .  Have you been in contact with someone that is currently pending confirmation of Covid19 testing or has been confirmed to have the Covid19 virus? NO  **If the patient answers NO to ALL questions -  advise the patient to please call the clinic before coming to the office should any symptoms develop.     

## 2019-08-24 LAB — CULTURE, BLOOD (ROUTINE X 2)
Culture: NO GROWTH
Culture: NO GROWTH

## 2019-08-26 ENCOUNTER — Encounter: Payer: Self-pay | Admitting: Family

## 2019-08-26 ENCOUNTER — Ambulatory Visit (INDEPENDENT_AMBULATORY_CARE_PROVIDER_SITE_OTHER): Payer: Medicaid Other | Admitting: Family

## 2019-08-26 ENCOUNTER — Other Ambulatory Visit: Payer: Self-pay

## 2019-08-26 VITALS — BP 160/87 | HR 79 | Temp 97.7°F | Ht 73.0 in | Wt 191.0 lb

## 2019-08-26 DIAGNOSIS — M5432 Sciatica, left side: Secondary | ICD-10-CM

## 2019-08-26 DIAGNOSIS — B20 Human immunodeficiency virus [HIV] disease: Secondary | ICD-10-CM | POA: Diagnosis present

## 2019-08-26 DIAGNOSIS — Z0000 Encounter for general adult medical examination without abnormal findings: Secondary | ICD-10-CM | POA: Diagnosis not present

## 2019-08-26 DIAGNOSIS — I10 Essential (primary) hypertension: Secondary | ICD-10-CM | POA: Diagnosis not present

## 2019-08-26 MED ORDER — CYCLOBENZAPRINE HCL 10 MG PO TABS: 10.0000 mg | ORAL_TABLET | Freq: Three times a day (TID) | ORAL | 1 refills | Status: DC | PRN

## 2019-08-26 MED ORDER — TRAMADOL HCL 50 MG PO TABS: 50.0000 mg | ORAL_TABLET | Freq: Four times a day (QID) | ORAL | 0 refills | Status: DC | PRN

## 2019-08-26 MED ORDER — BIKTARVY 50-200-25 MG PO TABS: 1.0000 | ORAL_TABLET | Freq: Every day | ORAL | 3 refills | Status: DC

## 2019-08-26 NOTE — Assessment & Plan Note (Signed)
Douglas Conway has acute onset sciatica of the left side with no significant trauma or injury that he recalls.  Likely muscle skeletal related.  Check lumbar x-ray and inflammatory markers.  Treat conservatively with ice/heat and home exercise therapy.  Start Flexeril as needed for muscle spasms.  Tramadol as needed for poorly controlled pain.  Recommend follow-up with orthopedics and/or sports medicine if symptoms worsen or do not improve.

## 2019-08-26 NOTE — Progress Notes (Signed)
Subjective:    Patient ID: Douglas Conway, male    DOB: [birth year], 64 y.o.   MRN: [id number]  Chief Complaint  Patient presents with  . Follow-up    B20--Pt stated lower back strain, sore going down to the Left lower leg.--more than 1 month     HPI:  Douglas Conway is a 64 y.o. male with HIV disease who was last seen in the office on 05/14/2019 with good adherence and tolerance to his ART regimen of Biktarvy.  Viral load at the time was undetectable with CD4 count of 981 from previous.  No recent blood work completed.  In the interim he has been admitted to the hospital with concern for altered mental status and possible acute mastoiditis for which he completed Augmentin.  Douglas Conway continues to take his Biktarvy as prescribed with no adverse side effects or missed doses since his last office visit.  Has new onset back pain over the last 2 months described as sharp with radiation down his left leg stopping around mid posterior thigh and described as knifelike and stabbing with a severity that causes him to alter his gait.  This has been refractory to ibuprofen and Motrin.  He has taken a Flexeril which has helped with his pain.  Denies any trauma or injury recently.  No changes in bowel/bladder habits or saddle paresthesia. Denies fevers, chills, night sweats, headaches, changes in vision, neck pain/stiffness, nausea, diarrhea, vomiting, lesions or rashes.  Douglas Conway has no problems obtaining his medication from the pharmacy.  He remains covered through [facility].  Denies feelings of being down, depressed, or hopeless recently.  He does consume alcohol on occasion and smokes tobacco every few days.  No current recreational or illicit drug use.  He is working full-time as an Personnel officer.   Allergies  Allergen Reactions  . Plavix [Clopidogrel Bisulfate] Rash      Outpatient Medications Prior to Visit  Medication Sig Dispense Refill  . acyclovir (ZOVIRAX) 400 MG tablet Take 1 tablet (400 mg total)  by mouth 5 (five) times daily. 60 tablet 3  . amoxicillin-clavulanate (AUGMENTIN) 875-125 MG tablet Take 1 tablet by mouth 2 (two) times daily. 20 tablet 0  . carvedilol (COREG) 6.25 MG tablet Take 1 tablet (6.25 mg total) by mouth 2 (two) times daily. 180 tablet 2  . Cholecalciferol 125 MCG (5000 UT) capsule Take 1 capsule (5,000 Units total) by mouth daily. 90 capsule 1  . digoxin (LANOXIN) 0.125 MG tablet Take 1 tablet (125 mcg total) by mouth daily. 30 tablet 4  . levothyroxine (SYNTHROID) 175 MCG tablet Take 1 tablet (175 mcg total) by mouth daily before breakfast. 90 tablet 1  . lisinopril (ZESTRIL) 10 MG tablet Take 1 tablet (10 mg total) by mouth daily. 90 tablet 2  . rosuvastatin (CRESTOR) 20 MG tablet Take 1 tablet (20 mg total) by mouth daily. 90 tablet 1  . BIKTARVY 50-200-25 MG TABS tablet TAKE 1 TABLET BY MOUTH DAILY 30 tablet 3   No facility-administered medications prior to visit.     Past Medical History:  Diagnosis Date  . 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 Chronic systolic congestive heart failure (HCC) 05/14/2019  . Coronary artery disease   . HIV (human immunodeficiency virus infection) (HCC)   . HIV disease (HCC) 06/28/2018  . Hypertension 06/28/2018  . Lateral epicondylitis 01/07/2019  . Other specified hypothyroidism 01/07/2019  . Thyroid disease      Past Surgical History:  Procedure Laterality Date  .  CORONARY ANGIOPLASTY WITH STENT PLACEMENT         Review of Systems  Constitutional: Negative for chills, diaphoresis, fatigue and fever.  Respiratory: Negative for cough, chest tightness, shortness of breath and wheezing.   Cardiovascular: Negative for chest pain.  Gastrointestinal: Negative for abdominal pain, diarrhea, nausea and vomiting.  Musculoskeletal: Positive for back pain and gait problem.      Objective:    BP (!) 160/87   Pulse 79   Temp 97.7 F (36.5 C)   Ht 6\' 1"  (1.854 m)   Wt 191 lb (86.6 kg)   SpO2 96%   BMI 25.20 kg/m    Nursing note and vital signs reviewed.  Physical Exam Constitutional:      General: He is not in acute distress.    Appearance: He is well-developed.  Eyes:     Conjunctiva/sclera: Conjunctivae normal.  Cardiovascular:     Rate and Rhythm: Normal rate and regular rhythm.     Heart sounds: Normal heart sounds. No murmur. No friction rub. No gallop.   Pulmonary:     Effort: Pulmonary effort is normal. No respiratory distress.     Breath sounds: Normal breath sounds. No wheezing or rales.  Chest:     Chest wall: No tenderness.  Abdominal:     General: Bowel sounds are normal.     Palpations: Abdomen is soft.     Tenderness: There is no abdominal tenderness.  Musculoskeletal:     Cervical back: Neck supple.  Lymphadenopathy:     Cervical: No cervical adenopathy.  Skin:    General: Skin is warm and dry.     Findings: No rash.  Neurological:     Mental Status: He is alert and oriented to person, place, and time.  Psychiatric:        Behavior: Behavior normal.        Thought Content: Thought content normal.        Judgment: Judgment normal.      Depression screen PHQ 2/9 02/04/2019  Decreased Interest 0  Down, Depressed, Hopeless 0  PHQ - 2 Score 0       Assessment & Plan:    Patient Active Problem List   Diagnosis Date Noted  . Sciatica of left side 08/26/2019  . Acute mastoiditis 08/19/2019  . Chronic systolic congestive heart failure ([place]) 05/14/2019  . Lateral epicondylitis 01/07/2019  . Other specified hypothyroidism 01/07/2019  . HIV disease ([place]) 06/28/2018  . 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 Hypertension 06/28/2018  . Healthcare maintenance 06/28/2018     Problem List Items Addressed This Visit      Cardiovascular and Mediastinum   Hypertension    Blood pressure elevated above goal 140/90 today likely secondary to pain.  Continue current dose of lisinopril and carvedilol.        Nervous and Auditory   Sciatica of left side - Primary     Douglas Conway has acute onset sciatica of the left side with no significant trauma or injury that he recalls.  Likely muscle skeletal related.  Check lumbar x-ray and inflammatory markers.  Treat conservatively with ice/heat and home exercise therapy.  Start Flexeril as needed for muscle spasms.  Tramadol as needed for poorly controlled pain.  Recommend follow-up with orthopedics and/or sports medicine if symptoms worsen or do not improve.      Relevant Medications   cyclobenzaprine (FLEXERIL) 10 MG tablet   traMADol (ULTRAM) 50 MG tablet   Other Relevant Orders  DG Lumbar Spine 1 View   C-reactive protein   Sedimentation rate     Other   HIV disease [facility])    Douglas Conway continues to appear to have well-controlled HIV disease with good adherence and tolerance to his ART regimen of Biktarvy.  No signs/symptoms of opportunistic infection or progressive HIV disease.  Reviewed previous lab work and discussed the plan of care.  Continue current dose of Biktarvy.  Check blood work today.  Plan for follow-up in 3 months or sooner if needed.      Relevant Medications   bictegravir-emtricitabine-tenofovir AF (BIKTARVY) 50-200-25 MG TABS tablet   Other Relevant Orders   HIV-1 RNA quant-no reflex-bld   T-helper cell (CD4)- ([facility] clinic only)   COMPLETE METABOLIC PANEL WITH GFR   Healthcare maintenance     Discussed Covid vaccination with recommendations to receive vaccination when able.  Discussed importance of safe sexual practice to reduce risk of STI.  Condoms declined.          I have changed [employer] I am also having him start on cyclobenzaprine and traMADol. Additionally, I am having him maintain his acyclovir, lisinopril, digoxin, carvedilol, Cholecalciferol, levothyroxine, rosuvastatin, and amoxicillin-clavulanate.   Meds ordered this encounter  Medications  . cyclobenzaprine (FLEXERIL) 10 MG tablet    Sig: Take 1 tablet (10 mg total) by mouth 3 (three) times daily as  needed for muscle spasms.    Dispense:  20 tablet    Refill:  1    Order Specific Question:   Supervising Provider    Answer:   Judyann Munson [[id number]]  . traMADol (ULTRAM) 50 MG tablet    Sig: Take 1 tablet (50 mg total) by mouth every 6 (six) hours as needed.    Dispense:  20 tablet    Refill:  0    Order Specific Question:   Supervising Provider    Answer:   Judyann Munson [[id number]]  . bictegravir-emtricitabine-tenofovir AF (BIKTARVY) 50-200-25 MG TABS tablet    Sig: Take 1 tablet by mouth daily.    Dispense:  30 tablet    Refill:  3    Order Specific Question:   Supervising Provider    Answer:   Judyann Munson [[id number]]     Follow-up: Return in about 3 months (around 11/26/2019).   Douglas Eke, MSN, FNP-C Nurse Practitioner [facility] for Infectious Disease [facility] Main number: [phone]

## 2019-08-26 NOTE — Patient Instructions (Signed)
Nice to see you.  Recommend heat/ice x 20 minutes every 2 hours as needed  Use the Ultram as needed for uncontrolled pain by Flexeril.  Flexeril may make you sleepy/drowsy so avoid operating heavy machinary.   Continue to take your [place] daily as prescribed.  We will check your blood work today.  If you back pain does not improve we will consider referral to orthopedics.  Plan for follow up in 3 months or sooner if needed with lab work on the same day.    Sciatica Rehab Ask your health care provider which exercises are safe for you. Do exercises exactly as told by your health care provider and adjust them as directed. It is normal to feel mild stretching, pulling, tightness, or discomfort as you do these exercises. Stop right away if you feel sudden pain or your pain gets worse. Do not begin these exercises until told by your health care provider. Stretching and range-of-motion exercises These exercises warm up your muscles and joints and improve the movement and flexibility of your hips and back. These exercises also help to relieve pain, numbness, and tingling. Sciatic nerve glide 1. Sit in a chair with your head facing down toward your chest. Place your hands behind your back. Let your shoulders slump forward. 2. Slowly straighten one of your legs while you tilt your head back as if you are looking toward the ceiling. Only straighten your leg as far as you can without making your symptoms worse. 3. Hold this position for __________ seconds. 4. Slowly return to the starting position. 5. Repeat with your other leg. Repeat __________ times. Complete this exercise __________ times a day. Knee to chest with hip adduction and internal rotation  1. Lie on your back on a firm surface with both legs straight. 2. Bend one of your knees and move it up toward your chest until you feel a gentle stretch in your lower back and buttock. Then, move your knee toward the shoulder that is on the  opposite side from your leg. This is hip adduction and internal rotation. ? Hold your leg in this position by holding on to the front of your knee. 3. Hold this position for __________ seconds. 4. Slowly return to the starting position. 5. Repeat with your other leg. Repeat __________ times. Complete this exercise __________ times a day. Prone extension on elbows  1. Lie on your abdomen on a firm surface. A bed may be too soft for this exercise. 2. Prop yourself up on your elbows. 3. Use your arms to help lift your chest up until you feel a gentle stretch in your abdomen and your lower back. ? This will place some of your body weight on your elbows. If this is uncomfortable, try stacking pillows under your chest. ? Your hips should stay down, against the surface that you are lying on. Keep your hip and back muscles relaxed. 4. Hold this position for __________ seconds. 5. Slowly relax your upper body and return to the starting position. Repeat __________ times. Complete this exercise __________ times a day. Strengthening exercises These exercises build strength and endurance in your back. Endurance is the ability to use your muscles for a long time, even after they get tired. Pelvic tilt This exercise strengthens the muscles that lie deep in the abdomen. 1. Lie on your back on a firm surface. Bend your knees and keep your feet flat on the floor. 2. Tense your abdominal muscles. Tip your pelvis up toward the  ceiling and flatten your lower back into the floor. ? To help with this exercise, you may place a small towel under your lower back and try to push your back into the towel. 3. Hold this position for __________ seconds. 4. Let your muscles relax completely before you repeat this exercise. Repeat __________ times. Complete this exercise __________ times a day. Alternating arm and leg raises  1. Get on your hands and knees on a firm surface. If you are on a hard floor, you may want to  use padding, such as an exercise mat, to cushion your knees. 2. Line up your arms and legs. Your hands should be directly below your shoulders, and your knees should be directly below your hips. 3. Lift your left leg behind you. At the same time, raise your right arm and straighten it in front of you. ? Do not lift your leg higher than your hip. ? Do not lift your arm higher than your shoulder. ? Keep your abdominal and back muscles tight. ? Keep your hips facing the ground. ? Do not arch your back. ? Keep your balance carefully, and do not hold your breath. 4. Hold this position for __________ seconds. 5. Slowly return to the starting position. 6. Repeat with your right leg and your left arm. Repeat __________ times. Complete this exercise __________ times a day. Posture and body mechanics Good posture and healthy body mechanics can help to relieve stress in your body's tissues and joints. Body mechanics refers to the movements and positions of your body while you do your daily activities. Posture is part of body mechanics. Good posture means:  Your spine is in its natural S-curve position (neutral).  Your shoulders are pulled back slightly.  Your head is not tipped forward. Follow these guidelines to improve your posture and body mechanics in your everyday activities. Standing   When standing, keep your spine neutral and your feet about hip width apart. Keep a slight bend in your knees. Your ears, shoulders, and hips should line up.  When you do a task in which you stand in one place for a long time, place one foot up on a stable object that is 2-4 inches (5-10 cm) high, such as a footstool. This helps keep your spine neutral. Sitting   When sitting, keep your spine neutral and keep your feet flat on the floor. Use a footrest, if necessary, and keep your thighs parallel to the floor. Avoid rounding your shoulders, and avoid tilting your head forward.  When working at a desk or a  computer, keep your desk at a height where your hands are slightly lower than your elbows. Slide your chair under your desk so you are close enough to maintain good posture.  When working at a computer, place your monitor at a height where you are looking straight ahead and you do not have to tilt your head forward or downward to look at the screen. Resting  When lying down and resting, avoid positions that are most painful for you.  If you have pain with activities such as sitting, bending, stooping, or squatting, lie in a position in which your body does not bend very much. For example, avoid curling up on your side with your arms and knees near your chest (fetal position).  If you have pain with activities such as standing for a long time or reaching with your arms, lie with your spine in a neutral position and bend your knees slightly. Try  the following positions: ? Lying on your side with a pillow between your knees. ? Lying on your back with a pillow under your knees. Lifting   When lifting objects, keep your feet at least shoulder width apart and tighten your abdominal muscles.  Bend your knees and hips and keep your spine neutral. It is important to lift using the strength of your legs, not your back. Do not lock your knees straight out.  Always ask for help to lift heavy or awkward objects. This information is not intended to replace advice given to you by your health care provider. Make sure you discuss any questions you have with your health care provider. Document Revised: 07/20/2018 Document Reviewed: 04/19/2018 Elsevier Patient Education  2020 [employer].

## 2019-08-26 NOTE — Assessment & Plan Note (Signed)
Mr. Cone continues to appear to have well-controlled HIV disease with good adherence and tolerance to his ART regimen of Biktarvy.  No signs/symptoms of opportunistic infection or progressive HIV disease.  Reviewed previous lab work and discussed the plan of care.  Continue current dose of Biktarvy.  Check blood work today.  Plan for follow-up in 3 months or sooner if needed.

## 2019-08-26 NOTE — Assessment & Plan Note (Signed)
Blood pressure elevated above goal 140/90 today likely secondary to pain.  Continue current dose of lisinopril and carvedilol.

## 2019-08-26 NOTE — Assessment & Plan Note (Signed)
   Discussed Covid vaccination with recommendations to receive vaccination when able.  Discussed importance of safe sexual practice to reduce risk of STI.  Condoms declined.

## 2019-08-27 LAB — T-HELPER CELL (CD4) - (RCID CLINIC ONLY)
CD4 % Helper T Cell: 39 % (ref 33–65)
CD4 T Cell Abs: 906 /uL (ref 400–1790)

## 2019-08-28 LAB — COMPLETE METABOLIC PANEL WITH GFR
AG Ratio: 1.6 (calc) (ref 1.0–2.5)
ALT: 30 U/L (ref 9–46)
AST: 30 U/L (ref 10–35)
Albumin: 4.2 g/dL (ref 3.6–5.1)
Alkaline phosphatase (APISO): 69 U/L (ref 35–144)
BUN: 21 mg/dL (ref 7–25)
CO2: 23 mmol/L (ref 20–32)
Calcium: 9.3 mg/dL (ref 8.6–10.3)
Chloride: 106 mmol/L (ref 98–110)
Creat: 0.87 mg/dL (ref 0.70–1.25)
GFR, Est African American: 108 mL/min/{1.73_m2} (ref >=60)
GFR, Est Non African American: 93 mL/min/{1.73_m2} (ref >=60)
Globulin: 2.7 g/dL (calc) (ref 1.9–3.7)
Glucose, Bld: 141 mg/dL — ABNORMAL HIGH (ref 65–99)
Potassium: 3.8 mmol/L (ref 3.5–5.3)
Sodium: 138 mmol/L (ref 135–146)
Total Bilirubin: 0.5 mg/dL (ref 0.2–1.2)
Total Protein: 6.9 g/dL (ref 6.1–8.1)

## 2019-08-28 LAB — C-REACTIVE PROTEIN: CRP: 7 mg/L (ref <8.0)

## 2019-08-28 LAB — HIV-1 RNA QUANT-NO REFLEX-BLD
HIV 1 RNA Quant: <20 copies/mL
HIV-1 RNA Quant, Log: <1.3 Log copies/mL

## 2019-08-28 LAB — SEDIMENTATION RATE: Sed Rate: 11 mm/h (ref 0–20)

## 2019-11-09 ENCOUNTER — Other Ambulatory Visit: Payer: Self-pay

## 2019-11-09 ENCOUNTER — Emergency Department
Admission: EM | Admit: 2019-11-09 | Discharge: 2019-11-11 | Disposition: A | Discharge status: Home / Self Care | Payer: Medicaid Other | Attending: Emergency Medicine | Admitting: Emergency Medicine

## 2019-11-09 DIAGNOSIS — Z5321 Procedure and treatment not carried out due to patient leaving prior to being seen by health care provider: Secondary | ICD-10-CM | POA: Diagnosis not present

## 2019-11-09 DIAGNOSIS — W57XXXA Bitten or stung by nonvenomous insect and other nonvenomous arthropods, initial encounter: Secondary | ICD-10-CM | POA: Diagnosis not present

## 2019-11-09 DIAGNOSIS — R21 Rash and other nonspecific skin eruption: Secondary | ICD-10-CM | POA: Diagnosis not present

## 2019-11-09 NOTE — ED Triage Notes (Signed)
Patient reports bitten by a tick a couple weeks ago and now with rash around the bite.

## 2019-11-15 ENCOUNTER — Other Ambulatory Visit: Payer: Self-pay | Admitting: Cardiology

## 2019-11-15 NOTE — Telephone Encounter (Signed)
Pt's pharmacy is requesting a refill on levothyroxine. Would Dr. Nelson like to refill this medication? Please address 

## 2019-11-19 NOTE — Telephone Encounter (Signed)
Pt is finding a PCP at this time.  He was incarcerated and recently released.  Dr. Delton See advised on his synthroid in the interim.  He is finding a PCP, but does not have one that currently follows his Thyroid at this time.

## 2019-11-19 NOTE — Telephone Encounter (Signed)
Please find out if patient's PCP is following thyroid

## 2019-11-19 NOTE — Telephone Encounter (Signed)
OK to refill Synthroid for 2 months.  OK to refill Crestor for the year

## 2019-11-26 ENCOUNTER — Other Ambulatory Visit: Payer: Self-pay

## 2019-11-26 ENCOUNTER — Emergency Department
Admission: EM | Admit: 2019-11-26 | Discharge: 2019-11-26 | Disposition: A | Discharge status: Home / Self Care | Payer: Medicaid Other | Attending: Emergency Medicine | Admitting: Emergency Medicine

## 2019-11-26 DIAGNOSIS — B20 Human immunodeficiency virus [HIV] disease: Secondary | ICD-10-CM | POA: Insufficient documentation

## 2019-11-26 DIAGNOSIS — S30861A Insect bite (nonvenomous) of abdominal wall, initial encounter: Secondary | ICD-10-CM

## 2019-11-26 DIAGNOSIS — Z79899 Other long term (current) drug therapy: Secondary | ICD-10-CM | POA: Diagnosis not present

## 2019-11-26 DIAGNOSIS — Y999 Unspecified external cause status: Secondary | ICD-10-CM | POA: Insufficient documentation

## 2019-11-26 DIAGNOSIS — W57XXXA Bitten or stung by nonvenomous insect and other nonvenomous arthropods, initial encounter: Secondary | ICD-10-CM | POA: Diagnosis not present

## 2019-11-26 DIAGNOSIS — F17210 Nicotine dependence, cigarettes, uncomplicated: Secondary | ICD-10-CM | POA: Insufficient documentation

## 2019-11-26 DIAGNOSIS — Y929 Unspecified place or not applicable: Secondary | ICD-10-CM | POA: Diagnosis not present

## 2019-11-26 DIAGNOSIS — Y939 Activity, unspecified: Secondary | ICD-10-CM | POA: Insufficient documentation

## 2019-11-26 DIAGNOSIS — I110 Hypertensive heart disease with heart failure: Secondary | ICD-10-CM | POA: Diagnosis not present

## 2019-11-26 DIAGNOSIS — I5022 Chronic systolic (congestive) heart failure: Secondary | ICD-10-CM | POA: Insufficient documentation

## 2019-11-26 MED ORDER — TRIAMCINOLONE ACETONIDE 0.5 % EX OINT
1.0000 | TOPICAL_OINTMENT | Freq: Two times a day (BID) | CUTANEOUS | 0 refills | Status: DC
Start: 2019-11-26 — End: 2020-03-18

## 2019-11-26 MED ORDER — DOXYCYCLINE HYCLATE 100 MG PO CAPS
100.0000 mg | ORAL_CAPSULE | Freq: Two times a day (BID) | ORAL | 0 refills | Status: DC
Start: 2019-11-26 — End: 2020-03-18

## 2019-11-26 NOTE — ED Provider Notes (Signed)
[facility] Emergency Department Provider Note  ____________________________________________  Time seen: Approximately 1:34 PM  I have reviewed the triage vital signs and the nursing notes.   HISTORY  Chief Complaint Insect Bite   HPI Douglas Conway is a 64 y.o. male who presents to the emergency department for treatment and evaluation of area on abdomen that has been present for the past 6 weeks. He pulled the tick off, but the redness in the area hasn't gone away. He developed a rash on the flexor surface of the left forearm 3 days after the tick bit him. No alleviating measures prior to arrival.   Past Medical History:  Diagnosis Date  . 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 Chronic systolic congestive heart failure (HCC) 05/14/2019  . Coronary artery disease   . HIV (human immunodeficiency virus infection) (HCC)   . HIV disease (HCC) 06/28/2018  . Hypertension 06/28/2018  . Lateral epicondylitis 01/07/2019  . Other specified hypothyroidism 01/07/2019  . Thyroid disease     Patient Active Problem List   Diagnosis Date Noted  . Sciatica of left side 08/26/2019  . Acute mastoiditis 08/19/2019  . Chronic systolic congestive heart failure (HCC) 05/14/2019  . Lateral epicondylitis 01/07/2019  . Other specified hypothyroidism 01/07/2019  . HIV disease (HCC) 06/28/2018  . 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 Hypertension 06/28/2018  . Healthcare maintenance 06/28/2018    Past Surgical History:  Procedure Laterality Date  . CORONARY ANGIOPLASTY WITH STENT PLACEMENT      Prior to Admission medications   Medication Sig Start Date End Date Taking? Authorizing Provider  acyclovir (ZOVIRAX) 400 MG tablet Take 1 tablet (400 mg total) by mouth 5 (five) times daily. 05/14/19   Veryl Speak, FNP  bictegravir-emtricitabine-tenofovir AF (BIKTARVY) 50-200-25 MG TABS tablet Take 1 tablet by mouth daily. 08/26/19   Veryl Speak, FNP  carvedilol (COREG)  6.25 MG tablet Take 1 tablet (6.25 mg total) by mouth 2 (two) times daily. 05/23/19   Lars Masson, MD  Cholecalciferol 125 MCG (5000 UT) capsule Take 1 capsule (5,000 Units total) by mouth daily. 06/13/19   Lars Masson, MD  cyclobenzaprine (FLEXERIL) 10 MG tablet Take 1 tablet (10 mg total) by mouth 3 (three) times daily as needed for muscle spasms. 08/26/19   Veryl Speak, FNP  digoxin (LANOXIN) 0.125 MG tablet Take 1 tablet (125 mcg total) by mouth daily. 05/23/19   Lars Masson, MD  doxycycline (VIBRAMYCIN) 100 MG capsule Take 1 capsule (100 mg total) by mouth 2 (two) times daily. 11/26/19   Malan Werk, Rulon Eisenmenger B, FNP  levothyroxine (SYNTHROID) 175 MCG tablet TAKE 1 TABLET BY MOUTH ONCE DAILY BEFORE BREAKFAST 11/19/19   Quintella Reichert, MD  lisinopril (ZESTRIL) 10 MG tablet Take 1 tablet (10 mg total) by mouth daily. 05/23/19   Lars Masson, MD  rosuvastatin (CRESTOR) 20 MG tablet Take 1 tablet by mouth once daily 11/15/19   Lars Masson, MD  traMADol (ULTRAM) 50 MG tablet Take 1 tablet (50 mg total) by mouth every 6 (six) hours as needed. 08/26/19   Veryl Speak, FNP  triamcinolone ointment (KENALOG) 0.5 % Apply 1 application topically 2 (two) times daily. 11/26/19   Chinita Pester, FNP    Allergies Plavix [clopidogrel bisulfate]  History reviewed. No pertinent family history.  Social History Social History   Tobacco Use  . Smoking status: Current Some Day Smoker    Packs/day: 0.10    Start  date: 01/07/1968  . Smokeless tobacco: Never Used  . Tobacco comment: cutting back  Vaping Use  . Vaping Use: Never used  Substance Use Topics  . Alcohol use: Yes    Alcohol/week: 12.0 standard drinks    Types: 12 Cans of beer per week  . Drug use: Never    Review of Systems  Constitutional: Negative for fever. Respiratory: Negative for cough or shortness of breath.  Musculoskeletal: Negative for myalgias Skin: Positive for rash and erythema. Neurological:  Negative for numbness or paresthesias. ____________________________________________   PHYSICAL EXAM:  VITAL SIGNS: ED Triage Vitals  Enc Vitals Group     BP 11/26/19 1241 (!) 133/91     Pulse Rate 11/26/19 1241 93     Resp 11/26/19 1241 16     Temp 11/26/19 1241 98.1 F (36.7 C)     Temp Source 11/26/19 1241 Oral     SpO2 11/26/19 1241 97 %     Weight 11/26/19 1242 180 lb (81.6 kg)     Height 11/26/19 1242 6\' 1"  (1.854 m)     Head Circumference --      Peak Flow --      Pain Score 11/26/19 1242 3     Pain Loc --      Pain Edu? --      Excl. in GC? --      Constitutional: Well appearing. Eyes: Conjunctivae are clear without discharge or drainage. Nose: No rhinorrhea noted. Mouth/Throat: Airway is patent.  Neck: No stridor. Unrestricted range of motion observed. Cardiovascular: Capillary refill is <3 seconds.  Respiratory: Respirations are even and unlabored.. Musculoskeletal: Unrestricted range of motion observed. Neurologic: Awake, alert, and oriented x 4.  Skin: Erythematous area on the left lower abdomen with surrounding induration.  There is no fluctuance or indication of abscess.  Excoriated maculopapular area on the flexor surface of the left forearm/elbow.  ____________________________________________   LABS (all labs ordered are listed, but only abnormal results are displayed)  Labs Reviewed - No data to display ____________________________________________  EKG  Not indicated. ____________________________________________  RADIOLOGY  Not indicated ____________________________________________   PROCEDURES  Procedures ____________________________________________   INITIAL IMPRESSION / ASSESSMENT AND PLAN / ED COURSE  Douglas Conway is a 64 y.o. male who presents to the emergency department 6 weeks after being bitten by a tick.  See HPI for further details.  Plan will be to treat him with some doxycycline and give triamcinolone for the pruritic rash on  his left arm.  He is to follow-up with primary care or return to the emergency department for symptoms that change, worsen, or do not improve over the next week or so.   Medications - No data to display   Pertinent labs & imaging results that were available during my care of the patient were reviewed by me and considered in my medical decision making (see chart for details).  ____________________________________________   FINAL CLINICAL IMPRESSION(S) / ED DIAGNOSES  Final diagnoses:  Tick bite of abdomen, initial encounter    ED Discharge Orders         Ordered    doxycycline (VIBRAMYCIN) 100 MG capsule  2 times daily     Discontinue  Reprint     11/26/19 1343    triamcinolone ointment (KENALOG) 0.5 %  2 times daily     Discontinue  Reprint     11/26/19 1343           Note:  This document was prepared using Dragon voice  recognition software and may include unintentional dictation errors.   Chinita Pester, FNP 11/26/19 1355    Gilles Chiquito, MD 11/26/19 1431

## 2019-11-26 NOTE — ED Triage Notes (Signed)
Pt states tick bite to lower abd a month ago. C/o it being sore and "breaking out in bumps all over." denies fever. A&O, ambulatory.

## 2019-11-26 NOTE — ED Notes (Signed)
Sore on abd for 6 weeks that per pt is from tick bite. Left ac area with rash that per pt started 3 days after bite. Per pt sweating makes rash worse.

## 2020-03-09 ENCOUNTER — Other Ambulatory Visit: Payer: Self-pay | Admitting: Family

## 2020-03-09 ENCOUNTER — Telehealth: Payer: Self-pay

## 2020-03-09 DIAGNOSIS — B20 Human immunodeficiency virus [HIV] disease: Secondary | ICD-10-CM

## 2020-03-09 NOTE — Telephone Encounter (Signed)
CMA contacted patient after reciving refill request for Biktarvy. Per chart refills were sent in May with 3 additional on file. CMA needed to confirm patient has not been off medication since August.  Spoke with patient who states he has only missed 1-2 days of medication. Denies being off medication. Is requesting one refill during call.  Patient did agree to scheduling follow up appt. Is scheduled for Next Wednesday labs same day. Will refill Biktarvy.

## 2020-03-18 ENCOUNTER — Encounter: Payer: Self-pay | Admitting: Family

## 2020-03-18 ENCOUNTER — Ambulatory Visit (INDEPENDENT_AMBULATORY_CARE_PROVIDER_SITE_OTHER): Payer: Medicaid Other | Admitting: Family

## 2020-03-18 ENCOUNTER — Other Ambulatory Visit: Payer: Self-pay

## 2020-03-18 VITALS — BP 156/110 | HR 101 | Temp 97.6°F | Wt 199.2 lb

## 2020-03-18 DIAGNOSIS — Z79899 Other long term (current) drug therapy: Secondary | ICD-10-CM | POA: Diagnosis not present

## 2020-03-18 DIAGNOSIS — Z23 Encounter for immunization: Secondary | ICD-10-CM | POA: Diagnosis not present

## 2020-03-18 DIAGNOSIS — B20 Human immunodeficiency virus [HIV] disease: Secondary | ICD-10-CM

## 2020-03-18 DIAGNOSIS — Z113 Encounter for screening for infections with a predominantly sexual mode of transmission: Secondary | ICD-10-CM

## 2020-03-18 DIAGNOSIS — Z0000 Encounter for general adult medical examination without abnormal findings: Secondary | ICD-10-CM | POA: Diagnosis not present

## 2020-03-18 NOTE — Progress Notes (Signed)
Subjective:    Patient ID: Douglas Conway, male    DOB: Aug 15, 1955, 64 y.o.   MRN: [id number]  Chief Complaint  Patient presents with  . Follow-up     HPI:  Douglas Conway is a 64 y.o. male with HIV disease who was last seen on 08/26/2019 with good adherence and tolerance to his ART regimen of Biktarvy.  Viral load at the time was undetectable with CD4 count of 906.  Kidney function, liver function, and electrolytes within normal ranges.  Here today for routine follow-up.  Douglas Conway continues to take his Biktarvy daily as prescribed with no adverse side effects or missed doses since his last office visit.  Overall feeling well today with no new concerns/complaints.  He is interested in learning more about the new injectable medications. Denies fevers, chills, night sweats, headaches, changes in vision, neck pain/stiffness, nausea, diarrhea, vomiting, lesions or rashes.  Douglas Conway has no problems obtaining his medication from the pharmacy and remains covered through [facility].  Denies feelings of being down, depressed, or hopeless recently.  No recreational or illicit drug use or alcohol consumption.  He does smoke tobacco on a very rare occasion.  Interested in receiving flu shot today declines condoms.  Covid vaccines are up-to-date per recommendations.  Due for routine dental care.    Allergies  Allergen Reactions  . Plavix [Clopidogrel Bisulfate] Rash      Outpatient Medications Prior to Visit  Medication Sig Dispense Refill  . BIKTARVY 50-200-25 MG TABS tablet Take 1 tablet by mouth once daily 30 tablet 0  . Cholecalciferol 125 MCG (5000 UT) capsule Take 1 capsule (5,000 Units total) by mouth daily. 90 capsule 1  . levothyroxine (SYNTHROID) 175 MCG tablet TAKE 1 TABLET BY MOUTH ONCE DAILY BEFORE BREAKFAST 90 tablet 0  . rosuvastatin (CRESTOR) 20 MG tablet Take 1 tablet by mouth once daily (Patient not taking: Reported on 03/18/2020) 90 tablet 1  . acyclovir (ZOVIRAX) 400 MG tablet Take 1  tablet (400 mg total) by mouth 5 (five) times daily. (Patient not taking: Reported on 03/18/2020) 60 tablet 3  . carvedilol (COREG) 6.25 MG tablet Take 1 tablet (6.25 mg total) by mouth 2 (two) times daily. (Patient not taking: Reported on 03/18/2020) 180 tablet 2  . cyclobenzaprine (FLEXERIL) 10 MG tablet Take 1 tablet (10 mg total) by mouth 3 (three) times daily as needed for muscle spasms. (Patient not taking: Reported on 03/18/2020) 20 tablet 1  . digoxin (LANOXIN) 0.125 MG tablet Take 1 tablet (125 mcg total) by mouth daily. (Patient not taking: Reported on 03/18/2020) 30 tablet 4  . doxycycline (VIBRAMYCIN) 100 MG capsule Take 1 capsule (100 mg total) by mouth 2 (two) times daily. (Patient not taking: Reported on 03/18/2020) 20 capsule 0  . lisinopril (ZESTRIL) 10 MG tablet Take 1 tablet (10 mg total) by mouth daily. (Patient not taking: Reported on 03/18/2020) 90 tablet 2  . traMADol (ULTRAM) 50 MG tablet Take 1 tablet (50 mg total) by mouth every 6 (six) hours as needed. (Patient not taking: Reported on 03/18/2020) 20 tablet 0  . triamcinolone ointment (KENALOG) 0.5 % Apply 1 application topically 2 (two) times daily. (Patient not taking: Reported on 03/18/2020) 30 g 0   No facility-administered medications prior to visit.     Past Medical History:  Diagnosis Date  . 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 Chronic systolic congestive heart failure ([place]) 05/14/2019  . Coronary artery disease   . HIV (human immunodeficiency virus infection) (  [place])   . HIV disease ([place]) 06/28/2018  . Hypertension 06/28/2018  . Lateral epicondylitis 01/07/2019  . Other specified hypothyroidism 01/07/2019  . Thyroid disease      Past Surgical History:  Procedure Laterality Date  . CORONARY ANGIOPLASTY WITH STENT PLACEMENT      Review of Systems  Constitutional: Negative for appetite change, chills, fatigue, fever and unexpected weight change.  Eyes: Negative for visual disturbance.  Respiratory: Negative for  cough, chest tightness, shortness of breath and wheezing.   Cardiovascular: Negative for chest pain and leg swelling.  Gastrointestinal: Negative for abdominal pain, constipation, diarrhea, nausea and vomiting.  Genitourinary: Negative for dysuria, flank pain, frequency, genital sores, hematuria and urgency.  Skin: Negative for rash.  Allergic/Immunologic: Negative for immunocompromised state.  Neurological: Negative for dizziness and headaches.      Objective:    BP (!) 156/110   Pulse (!) 101   Temp 97.6 F (36.4 C) (Oral)   Wt 199 lb 3.2 oz (90.4 kg)   BMI 26.28 kg/m  Nursing note and vital signs reviewed.  Physical Exam Constitutional:      General: He is not in acute distress.    Appearance: He is well-developed.  Eyes:     Conjunctiva/sclera: Conjunctivae normal.  Cardiovascular:     Rate and Rhythm: Normal rate and regular rhythm.     Heart sounds: Normal heart sounds. No murmur heard.  No friction rub. No gallop.   Pulmonary:     Effort: Pulmonary effort is normal. No respiratory distress.     Breath sounds: Normal breath sounds. No wheezing or rales.  Chest:     Chest wall: No tenderness.  Abdominal:     General: Bowel sounds are normal.     Palpations: Abdomen is soft.     Tenderness: There is no abdominal tenderness.  Musculoskeletal:     Cervical back: Neck supple.  Lymphadenopathy:     Cervical: No cervical adenopathy.  Skin:    General: Skin is warm and dry.     Findings: No rash.  Neurological:     Mental Status: He is alert and oriented to person, place, and time.  Psychiatric:        Behavior: Behavior normal.        Thought Content: Thought content normal.        Judgment: Judgment normal.      Depression screen PHQ 2/9 02/04/2019  Decreased Interest 0  Down, Depressed, Hopeless 0  PHQ - 2 Score 0       Assessment & Plan:    Patient Active Problem List   Diagnosis Date Noted  . Sciatica of left side 08/26/2019  . Acute mastoiditis  08/19/2019  . Chronic systolic congestive heart failure ([place]) 05/14/2019  . Lateral epicondylitis 01/07/2019  . Other specified hypothyroidism 01/07/2019  . HIV disease ([place]) 06/28/2018  . 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 Hypertension 06/28/2018  . Healthcare maintenance 06/28/2018     Problem List Items Addressed This Visit      Other   HIV disease ([place]) - Primary    Mr. Conroy appears to have well-controlled HIV disease with good adherence and tolerance to his ART regimen of Biktarvy.  No signs/symptoms of opportunistic infection or progressive HIV disease.  Reviewed previous lab work and discussed plan of care.  He is interested in starting on [place]. Discussed the risks and benefits of the new medication and he would like to proceed. Met with pharmacy staff today will  start oral lead in. Follow up injections have been scheduled. Plan for follow up in 1 month or sooner if needed.      Relevant Orders   COMPLETE METABOLIC PANEL WITH GFR   HIV-1 RNA quant-no reflex-bld   T-helper cell (CD4)- ([facility] clinic only)   Healthcare maintenance     Discussed importance of safe sexual practice to reduce risk of STI.  Condoms declined  Due for routine dental care with referral placed to [place] clinic.  Influenza updated today.  Will be due for Pneumovax at next office visit.  Due for routine colon cancer screening through colonoscopy.        Other Visit Diagnoses    Pharmacologic therapy       Relevant Orders   Lipid panel   Screening for STDs (sexually transmitted diseases)       Relevant Orders   RPR   Need for immunization against influenza       Relevant Orders   Flu Vaccine QUAD 36+ mos IM (Completed)       I have discontinued Yi Matton's acyclovir, lisinopril, digoxin, carvedilol, cyclobenzaprine, traMADol, doxycycline, and triamcinolone ointment. I am also having him maintain his Cholecalciferol, levothyroxine, rosuvastatin, and Biktarvy.   Follow-up:  Return in about 1 month (around 04/18/2020).   Terri Piedra, MSN, FNP-C Nurse Practitioner [facility] for Infectious Disease [place] number: [phone]

## 2020-03-18 NOTE — Assessment & Plan Note (Addendum)
Mr. Mummert appears to have well-controlled HIV disease with good adherence and tolerance to his ART regimen of Biktarvy.  No signs/symptoms of opportunistic infection or progressive HIV disease.  Reviewed previous lab work and discussed plan of care.  He is interested in starting on [place]. Discussed the risks and benefits of the new medication and he would like to proceed. Met with pharmacy staff today will start oral lead in. Follow up injections have been scheduled. Plan for follow up in 1 month or sooner if needed.

## 2020-03-18 NOTE — Assessment & Plan Note (Signed)
   Discussed importance of safe sexual practice to reduce risk of STI.  Condoms declined  Due for routine dental care with referral placed to [facility] dental clinic.  Influenza updated today.  Will be due for Pneumovax at next office visit.  Due for routine colon cancer screening through colonoscopy.

## 2020-03-18 NOTE — Patient Instructions (Signed)
Nice to see you.  We will check your lab work today.  We will get you started on Cabenuva oral lead in.   Plan for follow up in 1 month for your first set of injections.  Have a great day and stay safe!

## 2020-03-19 ENCOUNTER — Other Ambulatory Visit: Payer: Self-pay | Admitting: Family

## 2020-03-19 ENCOUNTER — Other Ambulatory Visit: Payer: Self-pay | Admitting: Cardiology

## 2020-03-19 LAB — T-HELPER CELL (CD4) - (RCID CLINIC ONLY)
CD4 % Helper T Cell: 40 % (ref 33–65)
CD4 T Cell Abs: 1112 /uL (ref 400–1790)

## 2020-03-19 MED ORDER — CABOTEGRAVIR & RILPIVIRINE ER 600 & 900 MG/3ML IM SUER: 1.0000 | Freq: Once | INTRAMUSCULAR | 0 refills | Status: DC

## 2020-03-19 NOTE — Addendum Note (Signed)
Addended by: Aggie Cosier L on: 03/19/2020 10:30 AM   Modules accepted: Orders

## 2020-03-19 NOTE — Progress Notes (Addendum)
HPI: Douglas Conway is a 64 y.o. male who presents to the [facility] clinic for HIV follow-up.  Patient Active Problem List   Diagnosis Date Noted  . Sciatica of left side 08/26/2019  . Acute mastoiditis 08/19/2019  . Chronic systolic congestive heart failure ([place]) 05/14/2019  . Lateral epicondylitis 01/07/2019  . Other specified hypothyroidism 01/07/2019  . HIV disease ([place]) 06/28/2018  . 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 Hypertension 06/28/2018  . Healthcare maintenance 06/28/2018    Patient's Medications  New Prescriptions   No medications on file  Previous Medications   BIKTARVY 50-200-25 MG TABS TABLET    Take 1 tablet by mouth once daily   CHOLECALCIFEROL 125 MCG (5000 UT) CAPSULE    Take 1 capsule (5,000 Units total) by mouth daily.   LEVOTHYROXINE (SYNTHROID) 175 MCG TABLET    TAKE 1 TABLET BY MOUTH ONCE DAILY BEFORE BREAKFAST   ROSUVASTATIN (CRESTOR) 20 MG TABLET    Take 1 tablet by mouth once daily  Modified Medications   No medications on file  Discontinued Medications   ACYCLOVIR (ZOVIRAX) 400 MG TABLET    Take 1 tablet (400 mg total) by mouth 5 (five) times daily.   CARVEDILOL (COREG) 6.25 MG TABLET    Take 1 tablet (6.25 mg total) by mouth 2 (two) times daily.   CYCLOBENZAPRINE (FLEXERIL) 10 MG TABLET    Take 1 tablet (10 mg total) by mouth 3 (three) times daily as needed for muscle spasms.   DIGOXIN (LANOXIN) 0.125 MG TABLET    Take 1 tablet (125 mcg total) by mouth daily.   DOXYCYCLINE (VIBRAMYCIN) 100 MG CAPSULE    Take 1 capsule (100 mg total) by mouth 2 (two) times daily.   LISINOPRIL (ZESTRIL) 10 MG TABLET    Take 1 tablet (10 mg total) by mouth daily.   TRAMADOL (ULTRAM) 50 MG TABLET    Take 1 tablet (50 mg total) by mouth every 6 (six) hours as needed.   TRIAMCINOLONE OINTMENT (KENALOG) 0.5 %    Apply 1 application topically 2 (two) times daily.    Allergies: Allergies  Allergen Reactions  . Plavix [Clopidogrel Bisulfate] Rash    Past  Medical History: Past Medical History:  Diagnosis Date  . 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 Chronic systolic congestive heart failure ([place]) 05/14/2019  . Coronary artery disease   . HIV (human immunodeficiency virus infection) ([place])   . HIV disease ([place]) 06/28/2018  . Hypertension 06/28/2018  . Lateral epicondylitis 01/07/2019  . Other specified hypothyroidism 01/07/2019  . Thyroid disease     Social History: Social History   Socioeconomic History  . Marital status: Divorced    Spouse name: Not on file  . Number of children: Not on file  . Years of education: Not on file  . Highest education level: Not on file  Occupational History  . Not on file  Tobacco Use  . Smoking status: Current Some Day Smoker    Packs/day: 0.10    Start date: 01/07/1968  . Smokeless tobacco: Never Used  . Tobacco comment: cutting back  Vaping Use  . Vaping Use: Never used  Substance and Sexual Activity  . Alcohol use: Yes    Alcohol/week: 12.0 standard drinks    Types: 12 Cans of beer per week  . Drug use: Never  . Sexual activity: Not Currently    Partners: Female    Birth control/protection: Condom    Comment: given condoms  Other Topics Concern  .  Not on file  Social History Narrative  . Not on file   Social Determinants of Health   Financial Resource Strain: Not on file  Food Insecurity: Not on file  Transportation Needs: Not on file  Physical Activity: Not on file  Stress: Not on file  Social Connections: Not on file    Labs: Lab Results  Component Value Date   HIV1RNAQUANT <20 NOT DETECTED 08/26/2019   HIV1RNAQUANT 24 (H) 05/14/2019   HIV1RNAQUANT <20 DETECTED (A) 01/07/2019   CD4TABS 906 08/26/2019   CD4TABS 981 01/07/2019   CD4TABS 800 06/13/2018    RPR and STI Lab Results  Component Value Date   LABRPR NON-REACTIVE 01/07/2019   LABRPR NON-REACTIVE 06/13/2018    STI Results GC CT  06/13/2018 Negative Negative    Hepatitis B Lab Results  Component Value  Date   HEPBSAB REACTIVE (A) 06/13/2018   HEPBSAG NON-REACTIVE 06/13/2018   HEPBCAB NON-REACTIVE 06/13/2018   Hepatitis C Lab Results  Component Value Date   HEPCAB REACTIVE (A) 06/13/2018   HCVRNAPCRQN <15 NOT DETECTED 06/13/2018   Hepatitis A Lab Results  Component Value Date   HAV REACTIVE (A) 06/13/2018   Lipids: Lab Results  Component Value Date   CHOL 106 03/18/2020   TRIG 130 03/18/2020   HDL 42 03/18/2020   CHOLHDL 2.5 03/18/2020   LDLCALC 43 03/18/2020    Current HIV Regimen: Biktarvy  Assessment: Met with patient today to discuss transitioning to [place]. He is eager and ready to start.   Discussed the process of starting Cabenuva injections including the need to take oral lead-in therapy for 28-30 days to assess tolerability to cabotegravir. Counseled patient to take one tablet of rilpivirine + one tablet of cabotegravir once daily WITH food. Encouraged patient to take the tablets around the same time each day and to make sure it is with a meal. Advised to never take one without the other. Discussed possible side effects such as nausea, headache, insomnia, and abnormal dreams. Explained the importance of not missing any doses of the 30 day regimen to make sure no resistance develops.   Explained to patient that we will set a target date each month to administer injections. Explained the importance of showing up to appointments and not missing any monthly injections. Warned that if patient misses 2 appointments, it will be reassessed by their provider whether they are a good candidate for injection therapy. Made appointment for patient to come in and receive initial injection 28-30 days after starting oral lead-in therapy.  Counseled that [place] is two separate intramuscular injections in the gluteal muscle on each side for each monthly visit. Cautioned on possible side effects such as injection-site reactions, fatigue, headache, nasuea, rash, and dizziness.  Will make  patient's follow up appointments for 6 months to help with compliance. Answered all questions. Gave patient my card to call me with any issues.  He has Medicaid insurance so injection therapy will be couriered from [employer].   Plan: - Start oral lead-in therapy - F/u with Marya Amsler and myself on 1/5 to start injections  Devonda Pequignot L. Nazar Kuan, PharmD, BCIDP, AAHIVP, CPP Clinical Pharmacist Practitioner Infectious Diseases [place] 03/19/2020, 9:32 AM

## 2020-03-22 LAB — HIV-1 RNA QUANT-NO REFLEX-BLD
HIV 1 RNA Quant: 36 Copies/mL — ABNORMAL HIGH
HIV-1 RNA Quant, Log: 1.56 Log cps/mL — ABNORMAL HIGH

## 2020-03-22 LAB — LIPID PANEL
Cholesterol: 106 mg/dL (ref <200)
HDL: 42 mg/dL (ref >=40)
LDL Cholesterol (Calc): 43 mg/dL (calc)
Non-HDL Cholesterol (Calc): 64 mg/dL (calc) (ref <130)
Total CHOL/HDL Ratio: 2.5 (calc) (ref <5.0)
Triglycerides: 130 mg/dL (ref <150)

## 2020-03-22 LAB — COMPLETE METABOLIC PANEL WITH GFR
AG Ratio: 1.5 (calc) (ref 1.0–2.5)
ALT: 30 U/L (ref 9–46)
AST: 30 U/L (ref 10–35)
Albumin: 4.1 g/dL (ref 3.6–5.1)
Alkaline phosphatase (APISO): 64 U/L (ref 35–144)
BUN: 18 mg/dL (ref 7–25)
CO2: 25 mmol/L (ref 20–32)
Calcium: 9.1 mg/dL (ref 8.6–10.3)
Chloride: 105 mmol/L (ref 98–110)
Creat: 1.03 mg/dL (ref 0.70–1.25)
GFR, Est African American: 90 mL/min/{1.73_m2} (ref >=60)
GFR, Est Non African American: 77 mL/min/{1.73_m2} (ref >=60)
Globulin: 2.8 g/dL (calc) (ref 1.9–3.7)
Glucose, Bld: 152 mg/dL — ABNORMAL HIGH (ref 65–99)
Potassium: 4.1 mmol/L (ref 3.5–5.3)
Sodium: 138 mmol/L (ref 135–146)
Total Bilirubin: 0.6 mg/dL (ref 0.2–1.2)
Total Protein: 6.9 g/dL (ref 6.1–8.1)

## 2020-03-22 LAB — RPR: RPR Ser Ql: NONREACTIVE

## 2020-04-06 MED FILL — CABENUVA 600 & 900 MG/3ML S: 600 & 900 | 28 days supply | Qty: 6 | Fill #0

## 2020-04-07 ENCOUNTER — Telehealth: Payer: Self-pay

## 2020-04-07 NOTE — Telephone Encounter (Addendum)
[facility] Patient Advocate Encounter  Patient's medication Renaldo Harrison) have been couriered to [facility] from [facility] and will be administered at his next appointment on 04/15/20.  Clearance Coots , CPhT Specialty Pharmacy Patient [facility] for Infectious Disease Phone: [phone] Fax:  [phone]

## 2020-04-15 ENCOUNTER — Encounter: Payer: Medicaid Other | Admitting: Family

## 2020-04-15 ENCOUNTER — Ambulatory Visit: Payer: Medicaid Other | Admitting: Pharmacist

## 2020-04-16 ENCOUNTER — Encounter: Payer: Self-pay | Admitting: Family

## 2020-04-16 ENCOUNTER — Ambulatory Visit (INDEPENDENT_AMBULATORY_CARE_PROVIDER_SITE_OTHER): Payer: Medicaid Other | Admitting: Family

## 2020-04-16 ENCOUNTER — Other Ambulatory Visit: Payer: Self-pay

## 2020-04-16 VITALS — BP 130/80 | HR 87 | Temp 98.0°F | Ht 73.0 in | Wt 200.0 lb

## 2020-04-16 DIAGNOSIS — B20 Human immunodeficiency virus [HIV] disease: Secondary | ICD-10-CM | POA: Diagnosis present

## 2020-04-16 MED ORDER — CABOTEGRAVIR & RILPIVIRINE ER 600 & 900 MG/3ML IM SUER
1.0000 | Freq: Once | INTRAMUSCULAR | Status: AC
  Administered 2020-04-16: 1 via INTRAMUSCULAR

## 2020-04-16 NOTE — Progress Notes (Signed)
Subjective:    Patient ID: Douglas Conway, male    DOB: 07/25/1955, 65 y.o.   MRN: [id number]  Chief Complaint  Patient presents with  . HIV Positive/AIDS     HPI:  Douglas Conway is a 65 y.o. male with HIV disease last seen on 03/18/20 with good adherence and tolerance to his ART regimen of Biktarvy. He was changed to the oral lead in for [place]. Here today for first injection.  Douglas Conway has been taking the cabetegravir and rilpivirine as prescribed and has missed one day of medication. Tolerating medication with no adverse side effects. Overall feeling well today.    Allergies  Allergen Reactions  . Plavix [Clopidogrel Bisulfate] Rash      Outpatient Medications Prior to Visit  Medication Sig Dispense Refill  . Cholecalciferol 125 MCG (5000 UT) capsule Take 1 capsule (5,000 Units total) by mouth daily. 90 capsule 1  . levothyroxine (SYNTHROID) 175 MCG tablet TAKE 1 TABLET BY MOUTH ONCE DAILY BEFORE BREAKFAST 90 tablet 0  . rosuvastatin (CRESTOR) 20 MG tablet Take 1 tablet by mouth once daily 90 tablet 1   No facility-administered medications prior to visit.     Past Medical History:  Diagnosis Date  . 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 Chronic systolic congestive heart failure ([place]) 05/14/2019  . Coronary artery disease   . HIV (human immunodeficiency virus infection) ([place])   . HIV disease ([place]) 06/28/2018  . Hypertension 06/28/2018  . Lateral epicondylitis 01/07/2019  . Other specified hypothyroidism 01/07/2019  . Thyroid disease      Past Surgical History:  Procedure Laterality Date  . CORONARY ANGIOPLASTY WITH STENT PLACEMENT      Review of Systems  Constitutional: Negative for appetite change, chills, fatigue, fever and unexpected weight change.  Eyes: Negative for visual disturbance.  Respiratory: Negative for cough, chest tightness, shortness of breath and wheezing.   Cardiovascular: Negative for chest pain and leg swelling.  Gastrointestinal: Negative  for abdominal pain, constipation, diarrhea, nausea and vomiting.  Genitourinary: Negative for dysuria, flank pain, frequency, genital sores, hematuria and urgency.  Skin: Negative for rash.  Allergic/Immunologic: Negative for immunocompromised state.  Neurological: Negative for dizziness and headaches.      Objective:    BP 130/80   Pulse 87   Temp 98 F (36.7 C)   Ht _0  (1.854 m)   Wt 200 lb (90.7 kg)   BMI 26.39 kg/m  Nursing note and vital signs reviewed.  Physical Exam Constitutional:      General: He is not in acute distress.    Appearance: He is well-developed.  HENT:     Mouth/Throat:     Mouth: Oropharynx is clear and moist.  Eyes:     Conjunctiva/sclera: Conjunctivae normal.  Cardiovascular:     Rate and Rhythm: Normal rate and regular rhythm.     Pulses: Intact distal pulses.     Heart sounds: Normal heart sounds. No murmur heard. No friction rub. No gallop.   Pulmonary:     Effort: Pulmonary effort is normal. No respiratory distress.     Breath sounds: Normal breath sounds. No wheezing or rales.  Chest:     Chest wall: No tenderness.  Abdominal:     General: Bowel sounds are normal.     Palpations: Abdomen is soft.     Tenderness: There is no abdominal tenderness.  Musculoskeletal:     Cervical back: Neck supple.  Lymphadenopathy:     Cervical: No cervical adenopathy.  Skin:    General: Skin is warm and dry.     Findings: No rash.  Neurological:     Mental Status: He is alert and oriented to person, place, and time.  Psychiatric:        Mood and Affect: Mood and affect normal.        Behavior: Behavior normal.        Thought Content: Thought content normal.        Judgment: Judgment normal.      Depression screen PHQ 2/9 02/04/2019  Decreased Interest 0  Down, Depressed, Hopeless 0  PHQ - 2 Score 0       Assessment & Plan:    Patient Active Problem List   Diagnosis Date Noted  . Sciatica of left side 08/26/2019  . Acute mastoiditis  08/19/2019  . Chronic systolic congestive heart failure ([place]) 05/14/2019  . Lateral epicondylitis 01/07/2019  . Other specified hypothyroidism 01/07/2019  . HIV disease ([place]) 06/28/2018  . 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 Hypertension 06/28/2018  . Healthcare maintenance 06/28/2018     Problem List Items Addressed This Visit      Other   HIV disease ([place]) - Primary    Mr. Douglas Conway received his first injection of Cabenuva without any complications. Baseline viral load checked today. Plan for follow up in 1 month or sooner if needed for next injection.       Relevant Orders   HIV-1 RNA quant-no reflex-bld      I am having [name] maintain his Cholecalciferol, rosuvastatin, and levothyroxine. We administered cabotegravir & rilpivirine ER.   Meds ordered this encounter  Medications  . cabotegravir & rilpivirine ER (CABENUVA) 600 & 900 MG/3ML injection 1 kit     Follow-up: No follow-ups on file.   Terri Piedra, MSN, FNP-C Nurse Practitioner [facility] for Infectious Disease [place] number: [phone]

## 2020-04-16 NOTE — Assessment & Plan Note (Signed)
Mr. Bacigalupi received his first injection of Cabenuva without any complications. Baseline viral load checked today. Plan for follow up in 1 month or sooner if needed for next injection.

## 2020-04-16 NOTE — Patient Instructions (Signed)
Nice to see you.  We will check your viral load today.  Plan for follow up in 1 month or sooner if needed.  Have a great day and stay safe!

## 2020-04-23 LAB — HIV-1 RNA QUANT-NO REFLEX-BLD
HIV 1 RNA Quant: <20 Copies/mL — ABNORMAL HIGH
HIV-1 RNA Quant, Log: <1.3 Log cps/mL — ABNORMAL HIGH

## 2020-04-24 ENCOUNTER — Other Ambulatory Visit: Payer: Self-pay | Admitting: Pharmacist

## 2020-04-24 DIAGNOSIS — B20 Human immunodeficiency virus [HIV] disease: Secondary | ICD-10-CM

## 2020-04-24 MED ORDER — CABENUVA 400 & 600 MG/2ML IM SUER: 1.0000 | INTRAMUSCULAR | 11 refills | Status: DC

## 2020-05-06 MED FILL — CABENUVA 400 & 600 MG/2ML S: 400 & 600 | 30 days supply | Qty: 4 | Fill #0

## 2020-05-14 ENCOUNTER — Encounter: Payer: Medicaid Other | Admitting: Family

## 2020-05-15 ENCOUNTER — Encounter: Payer: Self-pay | Admitting: Family

## 2020-05-15 ENCOUNTER — Other Ambulatory Visit: Payer: Self-pay

## 2020-05-15 ENCOUNTER — Ambulatory Visit (INDEPENDENT_AMBULATORY_CARE_PROVIDER_SITE_OTHER): Payer: Medicaid Other | Admitting: Family

## 2020-05-15 VITALS — BP 143/93 | HR 96 | Temp 97.7°F | Wt 204.8 lb

## 2020-05-15 DIAGNOSIS — Z0000 Encounter for general adult medical examination without abnormal findings: Secondary | ICD-10-CM

## 2020-05-15 DIAGNOSIS — B20 Human immunodeficiency virus [HIV] disease: Secondary | ICD-10-CM | POA: Diagnosis present

## 2020-05-15 MED ORDER — CABOTEGRAVIR & RILPIVIRINE ER 400 & 600 MG/2ML IM SUER
1.0000 | Freq: Once | INTRAMUSCULAR | Status: AC
  Administered 2020-05-15: 1 via INTRAMUSCULAR

## 2020-05-15 MED ORDER — ZOSTER VAC RECOMB ADJUVANTED 50 MCG/0.5ML IM SUSR: 0.5000 mL | Freq: Once | INTRAMUSCULAR | 1 refills | Status: AC

## 2020-05-15 NOTE — Assessment & Plan Note (Signed)
Mr. Douglas Conway continues to have well-controlled HIV disease with good tolerance to his injectable medication Cabenuva. Reviewed previous lab work and discussed plan of care. Maintenance dose of Cabenuva provided today without complication. Plan for follow-up in 1 month or sooner if needed.

## 2020-05-15 NOTE — Patient Instructions (Signed)
Nice to see you.  You have received your monthly injection of Cabenuva.   We will recheck your lab work next month.   Plan for follow up in 1 month or sooner if needed.   Have a great day and stay safe!

## 2020-05-15 NOTE — Progress Notes (Signed)
 Subjective:    Patient ID: Douglas Conway, male    DOB: 12/20/1955, 65 y.o.   MRN: [id number]  Chief Complaint  Patient presents with  . HIV Positive/AIDS     HPI:  Douglas Conway is a 65 y.o. male with HIV disease last seen on 04/16/20 for initiation of injectable medications with well-controlled virus that was undetectable and CD4 count of 1112. Here today for maintenance injection of Cabenuva.  [name] has done well over the past month with no adverse side effects with the exception of mild discomfort in the injection sites. Overall feeling well today with no new concerns/complaints. Denies fevers, chills, night sweats, headaches, changes in vision, neck pain/stiffness, nausea, diarrhea, vomiting, lesions or rashes.    Allergies  Allergen Reactions  . Plavix [Clopidogrel Bisulfate] Rash      Outpatient Medications Prior to Visit  Medication Sig Dispense Refill  . cabotegravir & rilpivirine ER (CABENUVA) 400 & 600 MG/2ML injection Inject 1 kit into the muscle every 30 (thirty) days. 4 mL 11  . Cholecalciferol 125 MCG (5000 UT) capsule Take 1 capsule (5,000 Units total) by mouth daily. 90 capsule 1  . levothyroxine (SYNTHROID) 175 MCG tablet TAKE 1 TABLET BY MOUTH ONCE DAILY BEFORE BREAKFAST 90 tablet 0  . rosuvastatin (CRESTOR) 20 MG tablet Take 1 tablet by mouth once daily 90 tablet 1   No facility-administered medications prior to visit.     Past Medical History:  Diagnosis Date  . 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 Chronic systolic congestive heart failure ([facility]) 05/14/2019  . Coronary artery disease   . HIV (human immunodeficiency virus infection) ([facility])   . HIV disease ([facility]) 06/28/2018  . Hypertension 06/28/2018  . Lateral epicondylitis 01/07/2019  . Other specified hypothyroidism 01/07/2019  . Thyroid disease      Past Surgical History:  Procedure Laterality Date  . CORONARY ANGIOPLASTY WITH STENT PLACEMENT         Review of Systems  Constitutional:  Negative for appetite change, chills, fatigue, fever and unexpected weight change.  Eyes: Negative for visual disturbance.  Respiratory: Negative for cough, chest tightness, shortness of breath and wheezing.   Cardiovascular: Negative for chest pain and leg swelling.  Gastrointestinal: Negative for abdominal pain, constipation, diarrhea, nausea and vomiting.  Genitourinary: Negative for dysuria, flank pain, frequency, genital sores, hematuria and urgency.  Skin: Negative for rash.  Allergic/Immunologic: Negative for immunocompromised state.  Neurological: Negative for dizziness and headaches.      Objective:    BP (!) 143/93   Pulse 96   Temp 97.7 F (36.5 C) (Oral)   Wt 204 lb 12.8 oz (92.9 kg)   BMI 27.02 kg/m  Nursing note and vital signs reviewed.  Physical Exam Constitutional:      General: He is not in acute distress.    Appearance: He is well-developed.  HENT:     Mouth/Throat:     Mouth: Oropharynx is clear and moist.  Eyes:     Conjunctiva/sclera: Conjunctivae normal.  Cardiovascular:     Rate and Rhythm: Normal rate and regular rhythm.     Pulses: Intact distal pulses.     Heart sounds: Normal heart sounds. No murmur heard. No friction rub. No gallop.   Pulmonary:     Effort: Pulmonary effort is normal. No respiratory distress.     Breath sounds: Normal breath sounds. No wheezing or rales.  Chest:     Chest wall: No tenderness.  Abdominal:     General:   Bowel sounds are normal.     Palpations: Abdomen is soft.     Tenderness: There is no abdominal tenderness.  Musculoskeletal:     Cervical back: Neck supple.  Lymphadenopathy:     Cervical: No cervical adenopathy.  Skin:    General: Skin is warm and dry.     Findings: No rash.  Neurological:     Mental Status: He is alert and oriented to person, place, and time.  Psychiatric:        Mood and Affect: Mood and affect normal.        Behavior: Behavior normal.        Thought Content: Thought content  normal.        Judgment: Judgment normal.      Depression screen PHQ 2/9 02/04/2019  Decreased Interest 0  Down, Depressed, Hopeless 0  PHQ - 2 Score 0       Assessment & Plan:    Patient Active Problem List   Diagnosis Date Noted  . Sciatica of left side 08/26/2019  . Acute mastoiditis 08/19/2019  . Chronic systolic congestive heart failure ([place]) 05/14/2019  . Lateral epicondylitis 01/07/2019  . Other specified hypothyroidism 01/07/2019  . HIV disease ([place]) 06/28/2018  . 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 Hypertension 06/28/2018  . Healthcare maintenance 06/28/2018     Problem List Items Addressed This Visit      Other   HIV disease [facility]) - Primary    Mr. Lynnette Caffey continues to have well-controlled HIV disease with good tolerance to his injectable medication Cabenuva. Reviewed previous lab work and discussed plan of care. Maintenance dose of Cabenuva provided today without complication. Plan for follow-up in 1 month or sooner if needed.      Relevant Medications   Zoster Vaccine Adjuvanted [facility]) injection   Healthcare maintenance    Discussed importance of safe sexual practice to reduce risk of STI. Condoms declined.  Shingrix prescription provided for herpes zoster prophylaxis.          I am having [name] start on Zoster Vaccine Adjuvanted. I am also having him maintain his Cholecalciferol, rosuvastatin, levothyroxine, and Cabenuva. We administered cabotegravir & rilpivirine ER.   Meds ordered this encounter  Medications  . Zoster Vaccine Adjuvanted [facility]) injection    Sig: Inject 0.5 mLs into the muscle once for 1 dose. Repeat in 2-6 months    Dispense:  0.5 mL    Refill:  1    Order Specific Question:   Supervising Provider    Answer:   Carlyle Basques [[id number]]  . cabotegravir & rilpivirine ER (CABENUVA) 400 & 600 MG/2ML injection 1 kit     Follow-up: Return in about 1 month (around 06/12/2020), or if symptoms worsen or fail to  improve.   Terri Piedra, MSN, FNP-C Nurse Practitioner [facility] for Infectious Disease [place] number: [phone]

## 2020-05-15 NOTE — Assessment & Plan Note (Signed)
Discussed importance of safe sexual practice to reduce risk of STI. Condoms declined.  Shingrix prescription provided for herpes zoster prophylaxis.

## 2020-06-10 MED FILL — CABENUVA 400 & 600 MG/2ML S: 400 & 600 | 30 days supply | Qty: 4 | Fill #1

## 2020-06-11 ENCOUNTER — Telehealth: Payer: Self-pay

## 2020-06-11 NOTE — Telephone Encounter (Signed)
[facility] Patient Advocate Encounter  Patient's medication Douglas Conway) have been couriered to [facility] from [employer] and will be administered on patient next appointment on 06/15/20.  Clearance Coots , CPhT Specialty Pharmacy Patient [facility] for Infectious Disease Phone: [phone] Fax:  [phone]

## 2020-06-15 ENCOUNTER — Other Ambulatory Visit: Payer: Self-pay | Admitting: Infectious Diseases

## 2020-06-15 ENCOUNTER — Ambulatory Visit (INDEPENDENT_AMBULATORY_CARE_PROVIDER_SITE_OTHER): Payer: Medicaid Other | Admitting: Infectious Diseases

## 2020-06-15 ENCOUNTER — Encounter: Payer: Self-pay | Admitting: Infectious Diseases

## 2020-06-15 ENCOUNTER — Other Ambulatory Visit: Payer: Self-pay

## 2020-06-15 VITALS — BP 134/97 | HR 82 | Temp 97.9°F | Resp 16 | Ht 73.0 in | Wt 200.0 lb

## 2020-06-15 DIAGNOSIS — B20 Human immunodeficiency virus [HIV] disease: Secondary | ICD-10-CM

## 2020-06-15 MED ORDER — CABOTEGRAVIR & RILPIVIRINE ER 600 & 900 MG/3ML IM SUER: 1.0000 | INTRAMUSCULAR | 6 refills | Status: DC

## 2020-06-15 MED ORDER — CABOTEGRAVIR & RILPIVIRINE ER 400 & 600 MG/2ML IM SUER
1.0000 | Freq: Once | INTRAMUSCULAR | Status: AC
  Administered 2020-06-15: 1 via INTRAMUSCULAR

## 2020-06-15 NOTE — Progress Notes (Signed)
Subjective:    Patient ID: Douglas Conway, male    DOB: 1955/05/26, 65 y.o.   MRN: [id number]  CC:  Cabenuva injection - maintenance injection    HPI:  Douglas Conway is a 65 y.o. male with well controlled HIV disease here for his monthly dose of cabenuva injection. He would like to move to every 2 month injections.  Had maybe 24 hours of soreness in the glute from injection.    Allergies  Allergen Reactions  . Plavix [Clopidogrel Bisulfate] Rash      Outpatient Medications Prior to Visit  Medication Sig Dispense Refill  . Cholecalciferol 125 MCG (5000 UT) capsule Take 1 capsule (5,000 Units total) by mouth daily. 90 capsule 1  . levothyroxine (SYNTHROID) 175 MCG tablet TAKE 1 TABLET BY MOUTH ONCE DAILY BEFORE BREAKFAST 90 tablet 0  . rosuvastatin (CRESTOR) 20 MG tablet Take 1 tablet by mouth once daily 90 tablet 1  . cabotegravir & rilpivirine ER (CABENUVA) 400 & 600 MG/2ML injection Inject 1 kit into the muscle every 30 (thirty) days. 4 mL 11   No facility-administered medications prior to visit.     Past Medical History:  Diagnosis Date  . 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 Chronic systolic congestive heart failure ([place]) 05/14/2019  . Coronary artery disease   . HIV (human immunodeficiency virus infection) ([place])   . HIV disease ([place]) 06/28/2018  . Hypertension 06/28/2018  . Lateral epicondylitis 01/07/2019  . Other specified hypothyroidism 01/07/2019  . Thyroid disease       Past Surgical History:  Procedure Laterality Date  . CORONARY ANGIOPLASTY WITH STENT PLACEMENT        Review of Systems  Constitutional: Negative for appetite change, chills, fatigue, fever and unexpected weight change.  Eyes: Negative for visual disturbance.  Respiratory: Negative for cough and shortness of breath.   Cardiovascular: Negative for chest pain and leg swelling.  Gastrointestinal: Negative for abdominal pain, diarrhea and nausea.  Genitourinary: Negative for dysuria, genital  sores and penile discharge.  Musculoskeletal: Negative for joint swelling.  Skin: Negative for color change and rash.  Neurological: Negative for dizziness and headaches.  Hematological: Negative for adenopathy.  Psychiatric/Behavioral: Negative for sleep disturbance. The patient is not nervous/anxious.         Objective:    BP (!) 134/97   Pulse 82   Temp 97.9 F (36.6 C)   Resp 16   Ht '6\' 1"'  (1.854 m)   Wt 200 lb (90.7 kg)   SpO2 96%   BMI 26.39 kg/m  Nursing note and vital signs reviewed.  Physical Exam - no exam conducted today       Assessment & Plan:   Patient Active Problem List   Diagnosis Date Noted  . Sciatica of left side 08/26/2019  . Chronic systolic congestive heart failure ([place]) 05/14/2019  . Lateral epicondylitis 01/07/2019  . Other specified hypothyroidism 01/07/2019  . HIV disease ([place]) 06/28/2018  . 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 Hypertension 06/28/2018  . Healthcare maintenance 06/28/2018    Problem List Items Addressed This Visit      Unprioritized   HIV disease ([place]) - Primary    Will check VL today to assess new medication.  Did well with first injection of Cabenuva with only mild injection site reaction.  Proceed with monthly dosing for now.   Next injection date scheduled for - 07/14/2020  At this visit we will re-dose with the higher 600-900 mg kit and then proceed  every 2 months from there. I will cancel his May appointment and notify pharmacy of the change.        Relevant Medications   cabotegravir & rilpivirine ER (CABENUVA) 600 & 900 MG/3ML injection   Other Relevant Orders   HIV-1 RNA quant-no reflex-bld       Meds ordered this encounter  Medications  . cabotegravir & rilpivirine ER (CABENUVA) 600 & 900 MG/3ML injection    Sig: Inject 1 kit into the muscle every 8 (eight) weeks.    Dispense:  6 mL    Refill:  6    Patient would prefer every 57monthinjections. Please D/C the 400-600 mg kit. Next injection  in 1 month on 4/05     Follow-up: RTC in 30 days for maintenance injection. Appt has been scheduled.    SJanene Madeira MSN, NP-C [facility] [email or web address] Pager: [phone]: [phone] [phone]

## 2020-06-15 NOTE — Patient Instructions (Addendum)
We gave you your injection of CABENUVA for treatment today.   Your next appointment has been scheduled in ONE month on 07/14/2020   At your April injection visit we will get the every 2 month dose ready for you (I don't foresee any problem with insurance holding this up).    After April you can come every 2 months around the 5th of each month.   Please call if you need to change your appointment so we can ensure it is still within the correct window of time for your treatment.     Helpful Tips:  If you experience any knots under the skin please use a warm compress to help.    Some people experience some redness at the site of the injection. This can be normal and should go away soon.   Moving around today is a good idea, if you sit too much it hurts more.   Please call the office to speak with our pharmacy or triage team if you have any questions or concerns.

## 2020-06-15 NOTE — Assessment & Plan Note (Addendum)
Will check VL today to assess new medication.  Did well with first injection of Cabenuva with only mild injection site reaction.  Proceed with monthly dosing for now.   Next injection date scheduled for - 07/14/2020  At this visit we will re-dose with the higher 600-900 mg kit and then proceed every 2 months from there. I will cancel his May appointment and notify pharmacy of the change.

## 2020-06-17 LAB — HIV-1 RNA QUANT-NO REFLEX-BLD
HIV 1 RNA Quant: 22 Copies/mL — ABNORMAL HIGH
HIV-1 RNA Quant, Log: 1.35 Log cps/mL — ABNORMAL HIGH

## 2020-06-17 NOTE — Progress Notes (Signed)
Please give Douglas Conway a call to let him know that his viral load is undetectable. I do want to do a few checks more frequently at his injection visit to ensure confidence in his new treatment.

## 2020-06-18 ENCOUNTER — Telehealth: Payer: Self-pay

## 2020-06-18 NOTE — Telephone Encounter (Signed)
I called patient and relayed lab result to the patient.  Patient verbalized understanding and had no questions. Douglas Conway T Pricilla Loveless

## 2020-06-18 NOTE — Telephone Encounter (Signed)
-----   Message from Blanchard Kelch, NP sent at 06/17/2020  8:31 PM EST ----- Please give Douglas Conway a call to let him know that his viral load is undetectable. I do want to do a few checks more frequently at his injection visit to ensure confidence in his new treatment.

## 2020-07-06 MED FILL — CABENUVA 600 & 900 MG/3ML S: 600 & 900 | 56 days supply | Qty: 6 | Fill #0

## 2020-07-07 ENCOUNTER — Telehealth: Payer: Self-pay

## 2020-07-07 ENCOUNTER — Other Ambulatory Visit (HOSPITAL_COMMUNITY): Payer: Self-pay

## 2020-07-07 NOTE — Telephone Encounter (Signed)
[facility] Patient Advocate Encounter  Patient's medication Renaldo Harrison) have been couriered to [facility] from [employer] and will be administered on patient next appointment on 07/14/20.  Clearance Coots , CPhT Specialty Pharmacy Patient [facility] for Infectious Disease Phone: [phone] Fax:  [phone]

## 2020-07-14 ENCOUNTER — Encounter: Payer: Self-pay | Admitting: Infectious Diseases

## 2020-07-14 ENCOUNTER — Ambulatory Visit (INDEPENDENT_AMBULATORY_CARE_PROVIDER_SITE_OTHER): Payer: Medicaid Other | Admitting: Infectious Diseases

## 2020-07-14 ENCOUNTER — Other Ambulatory Visit: Payer: Self-pay

## 2020-07-14 DIAGNOSIS — B20 Human immunodeficiency virus [HIV] disease: Secondary | ICD-10-CM | POA: Diagnosis not present

## 2020-07-14 MED ORDER — CABOTEGRAVIR & RILPIVIRINE ER 600 & 900 MG/3ML IM SUER
1.0000 | Freq: Once | INTRAMUSCULAR | Status: AC
Start: 2020-07-14 — End: 2020-07-14
  Administered 2020-07-14: 1 via INTRAMUSCULAR

## 2020-07-14 NOTE — Progress Notes (Signed)
   Subjective:    Patient ID: Douglas Conway, male    DOB: [birth year], 65 y.o.   MRN: [id number]  CC:  Cabenuva injection - maintenance injection    HPI:  Douglas Conway is a 65 y.o. male with well controlled HIV here for his monthly dose of cabenuva injection. He will be dosed for q33m injection interval today.  No significant problems with any of the previous injections.  Very minor side effects if any.   Allergies  Allergen Reactions  . Plavix [Clopidogrel Bisulfate] Rash      Outpatient Medications Prior to Visit  Medication Sig Dispense Refill  . cabotegravir & rilpivirine ER (CABENUVA) 600 & 900 MG/3ML injection INJECT 1 KIT INTO THE MUSCLE EVERY 8 (EIGHT) WEEKS. 6 mL 6  . cabotegravir & rilpivirine ER (CABENUVA) 600 & 900 MG/3ML injection INJECT 1 KIT INTO THE MUSCLE ONCE FOR 1 DOSE. 6 mL 0  . Cholecalciferol 125 MCG (5000 UT) capsule Take 1 capsule (5,000 Units total) by mouth daily. 90 capsule 1  . levothyroxine (SYNTHROID) 175 MCG tablet TAKE 1 TABLET BY MOUTH ONCE DAILY BEFORE BREAKFAST 90 tablet 0  . rosuvastatin (CRESTOR) 20 MG tablet Take 1 tablet by mouth once daily 90 tablet 1   No facility-administered medications prior to visit.     Past Medical History:  Diagnosis Date  . 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 Chronic systolic congestive heart failure ([place]) 05/14/2019  . Coronary artery disease   . HIV (human immunodeficiency virus infection) ([place])   . HIV disease ([place]) 06/28/2018  . Hypertension 06/28/2018  . Lateral epicondylitis 01/07/2019  . Other specified hypothyroidism 01/07/2019  . Thyroid disease       Past Surgical History:  Procedure Laterality Date  . CORONARY ANGIOPLASTY WITH STENT PLACEMENT        Review of Systems      Objective:    BP (!) 149/92   Pulse 82   Temp (!) 97.4 F (36.3 C) (Oral)   Wt 199 lb (90.3 kg)   BMI 26.25 kg/m  Nursing note and vital signs reviewed.  Physical Exam - no exam conducted today        Assessment & Plan:   Patient Active Problem List   Diagnosis Date Noted  . Sciatica of left side 08/26/2019  . Chronic systolic congestive heart failure ([place]) 05/14/2019  . Lateral epicondylitis 01/07/2019  . Other specified hypothyroidism 01/07/2019  . HIV disease ([place]) 06/28/2018  . 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 Hypertension 06/28/2018  . Healthcare maintenance 06/28/2018    Problem List Items Addressed This Visit      Unprioritized   HIV disease ([place])    Doing well on injections of Cabenuva. Tolerating nicely with only minor injection site reactions. All injections have been administered at appropriate treatment interval.  He will receive higher dose 600-900 mg kit today for q61m interval dosing.   Last VL result:  HIV 1 RNA Quant (Copies/mL)  Date Value  06/15/2020 22 (H)    Dose Interval: Q52m Next Appointment: 09/14/2020 --> will repeat VL at that visit for therapeutic monitoring.             Follow-up: RTC in 8 weeks for maintenance injection. Visit scheduled.    Janene Madeira, MSN, NP-C [facility] for Infectious Disease [email or web address] Pager: [phone] Office: [phone] [place]: [phone]

## 2020-07-14 NOTE — Addendum Note (Signed)
Addended by: Tressa Busman T on: 07/14/2020 03:50 PM   Modules accepted: Orders

## 2020-07-14 NOTE — Patient Instructions (Signed)
We gave you your injection of CABENUVA for treatment today.   Your next appointment has been scheduled in 2 months on 09/14/2020 - will plan to check another viral load at that time to make sure all continues to be good.      Helpful Tips:  If you experience any knots under the skin please use a warm compress to help.    Some people experience some redness at the site of the injection. This can be normal and should go away soon.   Moving around today is a good idea, if you sit too much it hurts more.   Please call the office to speak with our pharmacy or triage team if you have any questions or concerns.

## 2020-07-14 NOTE — Assessment & Plan Note (Signed)
Doing well on injections of Cabenuva. Tolerating nicely with only minor injection site reactions. All injections have been administered at appropriate treatment interval.  He will receive higher dose 600-900 mg kit today for q10minterval dosing.   Last VL result:  HIV 1 RNA Quant (Copies/mL)  Date Value  06/15/2020 22 (H)    Dose Interval: Q250mext Appointment: 09/14/2020 --> will repeat VL at that visit for therapeutic monitoring.

## 2020-08-13 ENCOUNTER — Encounter: Payer: Medicaid Other | Admitting: Infectious Diseases

## 2020-08-22 ENCOUNTER — Emergency Department
Admission: EM | Admit: 2020-08-22 | Discharge: 2020-08-22 | Disposition: A | Discharge status: Home / Self Care | Payer: Medicaid Other | Attending: Emergency Medicine | Admitting: Emergency Medicine

## 2020-08-22 ENCOUNTER — Other Ambulatory Visit: Payer: Self-pay

## 2020-08-22 ENCOUNTER — Emergency Department: Payer: Medicaid Other

## 2020-08-22 DIAGNOSIS — I2510 Atherosclerotic heart disease of native coronary artery without angina pectoris: Secondary | ICD-10-CM | POA: Insufficient documentation

## 2020-08-22 DIAGNOSIS — I110 Hypertensive heart disease with heart failure: Secondary | ICD-10-CM | POA: Insufficient documentation

## 2020-08-22 DIAGNOSIS — Z79899 Other long term (current) drug therapy: Secondary | ICD-10-CM | POA: Diagnosis not present

## 2020-08-22 DIAGNOSIS — F17210 Nicotine dependence, cigarettes, uncomplicated: Secondary | ICD-10-CM | POA: Insufficient documentation

## 2020-08-22 DIAGNOSIS — R0602 Shortness of breath: Secondary | ICD-10-CM | POA: Diagnosis not present

## 2020-08-22 DIAGNOSIS — I5022 Chronic systolic (congestive) heart failure: Secondary | ICD-10-CM | POA: Insufficient documentation

## 2020-08-22 DIAGNOSIS — E038 Other specified hypothyroidism: Secondary | ICD-10-CM | POA: Diagnosis not present

## 2020-08-22 DIAGNOSIS — R042 Hemoptysis: Secondary | ICD-10-CM | POA: Insufficient documentation

## 2020-08-22 DIAGNOSIS — R079 Chest pain, unspecified: Secondary | ICD-10-CM | POA: Diagnosis not present

## 2020-08-22 DIAGNOSIS — Z21 Asymptomatic human immunodeficiency virus [HIV] infection status: Secondary | ICD-10-CM | POA: Diagnosis not present

## 2020-08-22 LAB — CBC
HCT: 39.2 % (ref 39.0–52.0)
Hemoglobin: 13.3 g/dL (ref 13.0–17.0)
MCH: 30 pg (ref 26.0–34.0)
MCHC: 33.9 g/dL (ref 30.0–36.0)
MCV: 88.3 fL (ref 80.0–100.0)
Platelets: 255 10*3/uL (ref 150–400)
RBC: 4.44 MIL/uL (ref 4.22–5.81)
RDW: 14.2 % (ref 11.5–15.5)
WBC: 9.5 10*3/uL (ref 4.0–10.5)
nRBC: 0 % (ref 0.0–0.2)

## 2020-08-22 LAB — TROPONIN I (HIGH SENSITIVITY)
Troponin I (High Sensitivity): 29 ng/L — ABNORMAL HIGH (ref <18)
Troponin I (High Sensitivity): 32 ng/L — ABNORMAL HIGH (ref <18)

## 2020-08-22 LAB — BASIC METABOLIC PANEL
Anion gap: 9 (ref 5–15)
BUN: 25 mg/dL — ABNORMAL HIGH (ref 8–23)
CO2: 21 mmol/L — ABNORMAL LOW (ref 22–32)
Calcium: 8.9 mg/dL (ref 8.9–10.3)
Chloride: 105 mmol/L (ref 98–111)
Creatinine, Ser: 1.04 mg/dL (ref 0.61–1.24)
GFR, Estimated: >60 mL/min (ref >60)
Glucose, Bld: 130 mg/dL — ABNORMAL HIGH (ref 70–99)
Potassium: 3.7 mmol/L (ref 3.5–5.1)
Sodium: 135 mmol/L (ref 135–145)

## 2020-08-22 LAB — D-DIMER, QUANTITATIVE: D-Dimer, Quant: 1.11 ug/mL-FEU — ABNORMAL HIGH (ref 0.00–0.50)

## 2020-08-22 MED ORDER — ALBUTEROL SULFATE HFA 108 (90 BASE) MCG/ACT IN AERS: 2.0000 | INHALATION_SPRAY | Freq: Four times a day (QID) | RESPIRATORY_TRACT | 2 refills | Status: DC | PRN

## 2020-08-22 NOTE — ED Notes (Signed)
Patient transported to X-ray 

## 2020-08-22 NOTE — ED Provider Notes (Signed)
[facility] Emergency Department Provider Note   ____________________________________________   I have reviewed the triage vital signs and the nursing notes.   HISTORY  Chief Complaint Coughing up blood  History limited by: Not Limited   HPI Douglas Conway is a 65 y.o. male who presents to the emergency department today because of concern for coughing up blood.  The patient states that he first noticed this today.  He does state for the past few months he has been feeling more short of breath.  This has been accompanied by cough. Over the past month and a half he has had pain in his left lower chest.  He had attributed this to some work he did under the-of his car.  Patient states he has a long history of smoking although has never been given any inhalers or nebulizers.  Patient denies any fevers.  Does have some bilateral leg swelling but denies any pain in his legs.  Denies any recent travel.   Records reviewed. Per medical record review patient has a history of HTN.  Past Medical History:  Diagnosis Date  . 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 Chronic systolic congestive heart failure ([place]) 05/14/2019  . Coronary artery disease   . HIV (human immunodeficiency virus infection) ([place])   . HIV disease ([place]) 06/28/2018  . Hypertension 06/28/2018  . Lateral epicondylitis 01/07/2019  . Other specified hypothyroidism 01/07/2019  . Thyroid disease     Patient Active Problem List   Diagnosis Date Noted  . Sciatica of left side 08/26/2019  . Chronic systolic congestive heart failure ([place]) 05/14/2019  . Lateral epicondylitis 01/07/2019  . Other specified hypothyroidism 01/07/2019  . HIV disease ([place]) 06/28/2018  . 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 Hypertension 06/28/2018  . Healthcare maintenance 06/28/2018    Past Surgical History:  Procedure Laterality Date  . CORONARY ANGIOPLASTY WITH STENT PLACEMENT      Prior to Admission medications    Medication Sig Start Date End Date Taking? Authorizing Provider  cabotegravir & rilpivirine ER (CABENUVA) 600 & 900 MG/3ML injection INJECT 1 KIT INTO THE MUSCLE EVERY 8 (EIGHT) WEEKS. 06/15/20 06/15/21  [place] Callas, NP  cabotegravir & rilpivirine ER (CABENUVA) 600 & 900 MG/3ML injection INJECT 1 KIT INTO THE MUSCLE ONCE FOR 1 DOSE. 03/19/20 03/19/21  Golden Circle, FNP  Cholecalciferol 125 MCG (5000 UT) capsule Take 1 capsule (5,000 Units total) by mouth daily. 06/13/19   Dorothy Spark, MD  levothyroxine (SYNTHROID) 175 MCG tablet TAKE 1 TABLET BY MOUTH ONCE DAILY BEFORE BREAKFAST 03/19/20   Dorothy Spark, MD  rosuvastatin (CRESTOR) 20 MG tablet Take 1 tablet by mouth once daily 11/15/19   Dorothy Spark, MD    Allergies Plavix [clopidogrel bisulfate]  No family history on file.  Social History Social History   Tobacco Use  . Smoking status: Current Some Day Smoker    Packs/day: 0.10    Start date: 01/07/1968  . Smokeless tobacco: Never Used  . Tobacco comment: cutting back  Vaping Use  . Vaping Use: Never used  Substance Use Topics  . Alcohol use: Yes    Alcohol/week: 12.0 standard drinks    Types: 12 Cans of beer per week  . Drug use: Never    Review of Systems Constitutional: No fever/chills Eyes: No visual changes. ENT: No sore throat. Cardiovascular: Positive for chest pain. Respiratory: Positive for shortness of breath. Gastrointestinal: No abdominal pain.  No nausea, no vomiting.  No  diarrhea.   Genitourinary: Negative for dysuria. Musculoskeletal: Negative for back pain. Positive for bilateral leg swelling.  Skin: Negative for rash. Neurological: Negative for headaches, focal weakness or numbness.  ____________________________________________   PHYSICAL EXAM:  VITAL SIGNS: ED Triage Vitals  Enc Vitals Group     BP 08/22/20 1708 (!) 139/116     Pulse Rate 08/22/20 1708 94     Resp 08/22/20 1708 20     Temp 08/22/20 1708 99.1 F (37.3 C)      Temp Source 08/22/20 1708 Oral     SpO2 08/22/20 1708 95 %     Weight 08/22/20 1709 185 lb (83.9 kg)     Height 08/22/20 1709 '6\' 1"'  (1.854 m)     Head Circumference --      Peak Flow --      Pain Score 08/22/20 1709 6    Constitutional: Alert and oriented.  Eyes: Conjunctivae are normal.  ENT      Head: Normocephalic and atraumatic.      Nose: No congestion/rhinnorhea.      Mouth/Throat: Mucous membranes are moist.      Neck: No stridor. Hematological/Lymphatic/Immunilogical: No cervical lymphadenopathy. Cardiovascular: Normal rate, regular rhythm.  No murmurs, rubs, or gallops.  Respiratory: Normal respiratory effort without tachypnea nor retractions. Bilateral expiratory wheezing.  Gastrointestinal: Soft and non tender. No rebound. No guarding.  Genitourinary: Deferred Musculoskeletal: Normal range of motion in all extremities. No lower extremity edema. Neurologic:  Normal speech and language. No gross focal neurologic deficits are appreciated.  Skin:  Skin is warm, dry and intact. No rash noted. Psychiatric: Mood and affect are normal. Speech and behavior are normal. Patient exhibits appropriate insight and judgment.  ____________________________________________    LABS (pertinent positives/negatives)  Trop hs 29 CBC wbc 9.5, hgb 13.3, plt 255 BMP wnl except co2 21, glu 130, BUN 25  ____________________________________________   EKG  I, Nance Pear, attending physician, personally viewed and interpreted this EKG  EKG Time: 1716 Rate: 97 Rhythm: sinus rhythm with PVCs Axis: left axis deviation Intervals: qtc 525 QRS: LVH ST changes: no st elevation Impression: abnormal ekg ____________________________________________    RADIOLOGY  CXR IMPRESSION:  Chronic coarse bilateral interstitial opacities likely relating to  smoking history. Linear opacities at the left lung base favored to  represent scarring or atelectasis.      ____________________________________________   PROCEDURES  Procedures  ____________________________________________   INITIAL IMPRESSION / ASSESSMENT AND PLAN / ED COURSE  Pertinent labs & imaging results that were available during my care of the patient were reviewed by me and considered in my medical decision making (see chart for details).   Patient presented to the emergency department today because of concerns for coughing up blood.  It is somewhat the patient has been having issues with shortness of breath for quite some time.  Has a long history of smoking and had wheezing on exam.  Chest x-ray is consistent with chronic lung disease.  I discussed this with the patient.  Additionally patient has been having some chest pain.  While I had lower suspicion for blood clot I did discuss this with the patient.  Did want to check a D-dimer.  Patient however was quite adamant that he did not want to stay for those results given that he had to pick someone up.  This point I do have low suspicion for blood clot and will give patient prescription for albuterol inhaler.  D-dimer was sent. It did return positive. I was  able to contact the patient on the telephone and advised patient to return for imaging study.   ____________________________________________   FINAL CLINICAL IMPRESSION(S) / ED DIAGNOSES  Final diagnoses:  Hemoptysis     Note: This dictation was prepared with Dragon dictation. Any transcriptional errors that result from this process are unintentional     Nance Pear, MD 08/22/20 1945

## 2020-08-22 NOTE — Discharge Instructions (Addendum)
Please seek medical attention for any high fevers, chest pain, shortness of breath, change in behavior, persistent vomiting, bloody stool or any other new or concerning symptoms.  

## 2020-08-22 NOTE — ED Triage Notes (Signed)
Pt states that he has been having a cough and chest pain for the past couple months- pt states this afternoon he started coughing up bright red blood- pt states pain has been on the left side of his chest

## 2020-08-23 ENCOUNTER — Emergency Department: Payer: Medicaid Other

## 2020-08-23 ENCOUNTER — Other Ambulatory Visit: Payer: Self-pay

## 2020-08-23 ENCOUNTER — Emergency Department
Admission: EM | Admit: 2020-08-23 | Discharge: 2020-08-23 | Disposition: A | Discharge status: Home / Self Care | Payer: Medicaid Other | Attending: Emergency Medicine | Admitting: Emergency Medicine

## 2020-08-23 DIAGNOSIS — R0602 Shortness of breath: Secondary | ICD-10-CM | POA: Diagnosis not present

## 2020-08-23 DIAGNOSIS — I110 Hypertensive heart disease with heart failure: Secondary | ICD-10-CM | POA: Diagnosis not present

## 2020-08-23 DIAGNOSIS — Z79899 Other long term (current) drug therapy: Secondary | ICD-10-CM | POA: Insufficient documentation

## 2020-08-23 DIAGNOSIS — Z21 Asymptomatic human immunodeficiency virus [HIV] infection status: Secondary | ICD-10-CM | POA: Diagnosis not present

## 2020-08-23 DIAGNOSIS — R042 Hemoptysis: Secondary | ICD-10-CM | POA: Diagnosis not present

## 2020-08-23 DIAGNOSIS — F17200 Nicotine dependence, unspecified, uncomplicated: Secondary | ICD-10-CM | POA: Insufficient documentation

## 2020-08-23 DIAGNOSIS — E039 Hypothyroidism, unspecified: Secondary | ICD-10-CM | POA: Diagnosis not present

## 2020-08-23 DIAGNOSIS — I5022 Chronic systolic (congestive) heart failure: Secondary | ICD-10-CM | POA: Diagnosis not present

## 2020-08-23 DIAGNOSIS — I2510 Atherosclerotic heart disease of native coronary artery without angina pectoris: Secondary | ICD-10-CM | POA: Insufficient documentation

## 2020-08-23 MED ORDER — AMOXICILLIN-POT CLAVULANATE 875-125 MG PO TABS: 1.0000 | ORAL_TABLET | Freq: Two times a day (BID) | ORAL | 0 refills | Status: AC

## 2020-08-23 MED ORDER — IOHEXOL 350 MG/ML SOLN
75.0000 mL | Freq: Once | INTRAVENOUS | Status: AC | PRN
  Administered 2020-08-23: 75 mL via INTRAVENOUS

## 2020-08-23 NOTE — ED Triage Notes (Signed)
Pt to ED for more scans, was told he received phone call telling him he needed to come back to get scan for blood clots. Still endorses shob and coughing up blood. RR even and unlabored, NAD noted, speaking in complete sentences

## 2020-08-23 NOTE — ED Provider Notes (Signed)
[facility] Emergency Department Provider Note   ____________________________________________   I have reviewed the triage vital signs and the nursing notes.   HISTORY  Chief Complaint Hemoptysis  History limited by: Not Limited   HPI Douglas Conway is a 66 y.o. male who returns to the emergency department today because of concern for elevated d-dimer yesterday. The patient was seen by myself yesterday for shortness of breath and hemoptysis. He states that he feels about the same today as he did yesterday. Continues to have some blood streaked sputum. Left yesterday prior to d-dimer resulting. States he did not yet get a chance to pick up the albuterol inhaler.   Records reviewed.   Past Medical History:  Diagnosis Date  . 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 Chronic systolic congestive heart failure ([place]) 05/14/2019  . Coronary artery disease   . HIV (human immunodeficiency virus infection) ([place])   . HIV disease ([place]) 06/28/2018  . Hypertension 06/28/2018  . Lateral epicondylitis 01/07/2019  . Other specified hypothyroidism 01/07/2019  . Thyroid disease     Patient Active Problem List   Diagnosis Date Noted  . Sciatica of left side 08/26/2019  . Chronic systolic congestive heart failure ([place]) 05/14/2019  . Lateral epicondylitis 01/07/2019  . Other specified hypothyroidism 01/07/2019  . HIV disease ([place]) 06/28/2018  . 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 Hypertension 06/28/2018  . Healthcare maintenance 06/28/2018    Past Surgical History:  Procedure Laterality Date  . CORONARY ANGIOPLASTY WITH STENT PLACEMENT      Prior to Admission medications   Medication Sig Start Date End Date Taking? Authorizing Provider  albuterol (VENTOLIN HFA) 108 (90 Base) MCG/ACT inhaler Inhale 2 puffs into the lungs every 6 (six) hours as needed for wheezing or shortness of breath. 08/22/20   Nance Pear, MD  cabotegravir & rilpivirine ER (CABENUVA) 600 &  900 MG/3ML injection INJECT 1 KIT INTO THE MUSCLE EVERY 8 (EIGHT) WEEKS. 06/15/20 06/15/21   Callas, NP  cabotegravir & rilpivirine ER (CABENUVA) 600 & 900 MG/3ML injection INJECT 1 KIT INTO THE MUSCLE ONCE FOR 1 DOSE. 03/19/20 03/19/21  Golden Circle, FNP  Cholecalciferol 125 MCG (5000 UT) capsule Take 1 capsule (5,000 Units total) by mouth daily. 06/13/19   Dorothy Spark, MD  levothyroxine (SYNTHROID) 175 MCG tablet TAKE 1 TABLET BY MOUTH ONCE DAILY BEFORE BREAKFAST 03/19/20   Dorothy Spark, MD  rosuvastatin (CRESTOR) 20 MG tablet Take 1 tablet by mouth once daily 11/15/19   Dorothy Spark, MD    Allergies Plavix [clopidogrel bisulfate]  No family history on file.  Social History Social History   Tobacco Use  . Smoking status: Current Some Day Smoker    Packs/day: 0.10    Start date: 01/07/1968  . Smokeless tobacco: Never Used  . Tobacco comment: cutting back  Vaping Use  . Vaping Use: Never used  Substance Use Topics  . Alcohol use: Yes    Alcohol/week: 12.0 standard drinks    Types: 12 Cans of beer per week  . Drug use: Never    Review of Systems Constitutional: No fever/chills Eyes: No visual changes. ENT: No sore throat. Cardiovascular: Denies chest pain. Respiratory: Positive for shortness of breath. Positive for hemoptysis.  Gastrointestinal: No abdominal pain.  No nausea, no vomiting.  No diarrhea.   Genitourinary: Negative for dysuria. Musculoskeletal: Negative for back pain. Skin: Negative for rash. Neurological: Negative for headaches, focal weakness or numbness.  ____________________________________________   PHYSICAL  EXAM:  VITAL SIGNS: ED Triage Vitals  Enc Vitals Group     BP 08/23/20 1322 126/84     Pulse Rate 08/23/20 1319 88     Resp 08/23/20 1319 20     Temp 08/23/20 1319 98.7 F (37.1 C)     Temp Source 08/23/20 1319 Oral     SpO2 08/23/20 1319 96 %     Weight 08/23/20 1320 182 lb 15.7 oz (83 kg)     Height 08/23/20 1320  '6\' 1"'  (1.854 m)     Head Circumference --      Peak Flow --      Pain Score 08/23/20 1320 3   Constitutional: Alert and oriented.  Eyes: Conjunctivae are normal.  ENT      Head: Normocephalic and atraumatic.      Nose: No congestion/rhinnorhea.      Mouth/Throat: Mucous membranes are moist.      Neck: No stridor. Hematological/Lymphatic/Immunilogical: No cervical lymphadenopathy. Cardiovascular: Normal rate, regular rhythm.  No murmurs, rubs, or gallops.  Respiratory: Normal respiratory effort without tachypnea nor retractions. Slight expiratory wheezing.  Gastrointestinal: Soft and non tender. No rebound. No guarding.  Genitourinary: Deferred Musculoskeletal: Normal range of motion in all extremities. No lower extremity edema. Neurologic:  Normal speech and language. No gross focal neurologic deficits are appreciated.  Skin:  Skin is warm, dry and intact. No rash noted. Psychiatric: Mood and affect are normal. Speech and behavior are normal. Patient exhibits appropriate insight and judgment.  ____________________________________________    LABS (pertinent positives/negatives)  None  ____________________________________________   EKG  None  ____________________________________________    RADIOLOGY  CT angio PE No pulmonary embolism. Dense heterogeneous airspace disease in the posterior lingula.    ____________________________________________   PROCEDURES  Procedures  ____________________________________________   INITIAL IMPRESSION / ASSESSMENT AND PLAN / ED COURSE  Pertinent labs & imaging results that were available during my care of the patient were reviewed by me and considered in my medical decision making (see chart for details).   Patient presented to the emergency department today because of elevated D-dimer that was checked yesterday.  CT did not show any pulmonary embolism however did show an area of heterogeneous opacity in the posterior lingula.   This was concerning for possible infection or aspiration.  Did discuss this in the setting of hemoptysis with pulmonology.  At this time they felt she could be discharged home given that it is phlegm streaked with blood and patient's vital signs are stable.  Will plan on starting patient on antibiotics.  They also recommended steroids however patient stated he did not want to be put on steroids given that they can modulate his immune system in the setting of HIV. Will give patient pulmonology follow up information. ____________________________________________   FINAL CLINICAL IMPRESSION(S) / ED DIAGNOSES  Final diagnoses:  Shortness of breath  Hemoptysis     Note: This dictation was prepared with Dragon dictation. Any transcriptional errors that result from this process are unintentional     Nance Pear, MD 08/23/20 [phone]

## 2020-08-23 NOTE — Discharge Instructions (Addendum)
Please follow up with the pulmonologist. Please seek medical attention for any high fevers, worsening chest pain, worsening shortness of breath, change in behavior, persistent vomiting, bloody stool or any other new or concerning symptoms.

## 2020-08-31 ENCOUNTER — Other Ambulatory Visit (HOSPITAL_COMMUNITY): Payer: Self-pay

## 2020-08-31 MED FILL — Cabotegravir 600 MG/3ML & Rilpivirine 900 MG/3ML IM Susp ER: INTRAMUSCULAR | 56 days supply | Qty: 6 | Fill #0 | Status: AC

## 2020-09-04 ENCOUNTER — Other Ambulatory Visit (HOSPITAL_COMMUNITY): Payer: Self-pay

## 2020-09-08 ENCOUNTER — Telehealth: Payer: Self-pay

## 2020-09-08 NOTE — Telephone Encounter (Signed)
[facility] Patient Advocate Encounter  Patient's medication (Cabenuva) have been couriered to [facility] from [facility] and will be administered on patient next office visit on 09/14/20.  Emiline Mancebo , CPhT [facility] Patient Adv[facility] Phone: [phone] Fax:  [phone]  

## 2020-09-14 ENCOUNTER — Encounter: Payer: Self-pay | Admitting: Infectious Diseases

## 2020-09-14 ENCOUNTER — Other Ambulatory Visit: Payer: Self-pay

## 2020-09-14 ENCOUNTER — Ambulatory Visit (INDEPENDENT_AMBULATORY_CARE_PROVIDER_SITE_OTHER): Payer: Medicaid Other | Admitting: Infectious Diseases

## 2020-09-14 VITALS — BP 139/91 | HR 86 | Temp 97.9°F | Wt 189.0 lb

## 2020-09-14 DIAGNOSIS — M25461 Effusion, right knee: Secondary | ICD-10-CM | POA: Diagnosis not present

## 2020-09-14 DIAGNOSIS — R042 Hemoptysis: Secondary | ICD-10-CM | POA: Diagnosis not present

## 2020-09-14 DIAGNOSIS — R0602 Shortness of breath: Secondary | ICD-10-CM | POA: Diagnosis not present

## 2020-09-14 DIAGNOSIS — M25561 Pain in right knee: Secondary | ICD-10-CM

## 2020-09-14 DIAGNOSIS — B20 Human immunodeficiency virus [HIV] disease: Secondary | ICD-10-CM

## 2020-09-14 MED ORDER — CABOTEGRAVIR & RILPIVIRINE ER 600 & 900 MG/3ML IM SUER
1.0000 | Freq: Once | INTRAMUSCULAR | Status: AC
Start: 2020-09-14 — End: 2020-09-14
  Administered 2020-09-14: 1 via INTRAMUSCULAR

## 2020-09-14 NOTE — Progress Notes (Signed)
Subjective:    Patient ID: Douglas Conway, male    DOB: 07-Apr-1956, 65 y.o.   MRN: [id number]  CC:  Cabenuva injection - maintenance injection (started 04/16/2020 and on Q16mdose) Increased shortness of breath   HPI:  Douglas Peppardis a 65y.o. male with well controlled HIV here for his dose of injectable CABENUVA.  He continues to do well with only minor injection site reactions.  Has received last COVID booster 03/2020.  He has a few concerns today.  [facility] and shortness of breath and bright red blood when he coughed.  This is all new for him.  Prior to this he was running about 12 miles a week.  He has not been able to do this for several weeks now.  Admits that he has gained about 15 pounds and wondering if this is contributing as well.  He did improve after taking the antibiotic that he was given but still has residual shortness of breath and dry cough with decreased activity tolerance.  Smokes cigarettes infrequently.  He used to see cardiologist in the past and was taking digoxin but this has been off his medication list for several months now.  He is asking if we can rewrite this for him today.  He remembers that it made him feel a lot better and brought up his EF from 25 to 35%.  New knee swelling noted to the right knee - looks like a big egg on the front of his knee below the knee cap. 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 Scratches and calloses over both skin. He is requesting an antibiotic in the event it is infected. No pain/restricted ROM. He has knee pads but does not use them. No erythema noted.     Allergies  Allergen Reactions  . Plavix [Clopidogrel Bisulfate] Rash      Outpatient Medications Prior to Visit  Medication Sig Dispense Refill  . albuterol (VENTOLIN HFA) 108 (90 Base) MCG/ACT inhaler Inhale 2 puffs into the lungs every 6 (six) hours as  needed for wheezing or shortness of breath. 8 g 2  . cabotegravir & rilpivirine ER (CABENUVA) 600 & 900 MG/3ML injection INJECT 1 KIT INTO THE MUSCLE EVERY 8 (EIGHT) WEEKS. 6 mL 6  . cabotegravir & rilpivirine ER (CABENUVA) 600 & 900 MG/3ML injection INJECT 1 KIT INTO THE MUSCLE ONCE FOR 1 DOSE. 6 mL 0  . Cholecalciferol 125 MCG (5000 UT) capsule Take 1 capsule (5,000 Units total) by mouth daily. 90 capsule 1  . levothyroxine (SYNTHROID) 175 MCG tablet TAKE 1 TABLET BY MOUTH ONCE DAILY BEFORE BREAKFAST 90 tablet 0  . rosuvastatin (CRESTOR) 20 MG tablet Take 1 tablet by mouth once daily 90 tablet 1   No facility-administered medications prior to visit.     Past Medical History:  Diagnosis Date  . 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 Chronic systolic congestive heart failure ([place] 05/14/2019  . Coronary artery disease   . HIV (human immunodeficiency virus infection) ([place]   . HIV disease ([place] 06/28/2018  . Hypertension 06/28/2018  . Lateral epicondylitis 01/07/2019  . Other specified hypothyroidism 01/07/2019  . Thyroid disease       Past Surgical History:  Procedure Laterality Date  . CORONARY ANGIOPLASTY WITH STENT PLACEMENT        Review of Systems  Constitutional: Negative  for chills and fever.  HENT: Negative for sore throat.   Respiratory: Positive for cough, shortness of breath and wheezing. Negative for chest tightness.   Cardiovascular: Negative for chest pain.  Gastrointestinal: Negative for abdominal pain, diarrhea and vomiting.  Genitourinary: Negative for difficulty urinating.  Musculoskeletal: Negative for myalgias and neck pain.       Large lump on the anterior right shin  Skin: Negative for rash.  Neurological: Negative for headaches.  Psychiatric/Behavioral: The patient is not nervous/anxious.         Objective:    BP (!) 139/91   Pulse 86   Temp 97.9 F (36.6 C) (Oral)   Wt 189 lb (85.7 kg)   BMI 24.94 kg/m  Nursing note and vital signs  reviewed.  Physical Exam Vitals reviewed.  HENT:     Mouth/Throat:     Mouth: No oral lesions.     Dentition: Normal dentition. No dental caries.  Eyes:     General: No scleral icterus. Cardiovascular:     Rate and Rhythm: Normal rate and regular rhythm.     Heart sounds: Normal heart sounds.  Pulmonary:     Effort: Pulmonary effort is normal.     Breath sounds: Normal breath sounds.  Abdominal:     General: There is no distension.     Palpations: Abdomen is soft.     Tenderness: There is no abdominal tenderness.  Musculoskeletal:     Comments: Large nodule on the anterior right shin. > 5 cm. Firm, no fluctuance, no erythema or tenderness. Normal joint ROM at the knee.   Lymphadenopathy:     Cervical: No cervical adenopathy.  Skin:    General: Skin is warm and dry.     Capillary Refill: Capillary refill takes less than 2 seconds.     Findings: No rash.  Neurological:     Mental Status: He is alert and oriented to person, place, and time.         Assessment & Plan:   Patient Active Problem List   Diagnosis Date Noted  . Shortness of breath 09/14/2020  . Pain and swelling of knee, right 09/14/2020  . Sciatica of left side 08/26/2019  . Chronic systolic congestive heart failure ([place]) 05/14/2019  . Other specified hypothyroidism 01/07/2019  . HIV disease ([place]) 06/28/2018  . 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 Hypertension 06/28/2018  . Healthcare maintenance 06/28/2018    Problem List Items Addressed This Visit      Unprioritized   Shortness of breath    Douglas Conway has had some progressive shortness of breath for over a month now.  Sounds like he had possible pneumonia that was seen on CT scan about 3 weeks ago.  I reviewed the ER visit - [facility] was negative for PE but showed some mild emphysema, cardiomegaly, concern for aspiration/pneumonitis in the right lingula.  Overall lung assessment is clear without any concern for wheezing or rales. He has  irregular heart rate that seems more c/w PVCs - reviewed recent EKG and shows this from 5/22 ER visit.  Upon further review of his medical history I saw a echo in March 2021 with a depressed EF of 35%.  Douglas Conway tells me that he used to be maintained on once daily digoxin that helped him feel much better and he is wondering if I can refill this for him today.  I explained to him that we should get him into see cardiology and pulmonology  given that they can both contribute to his shortness of breath symptoms.  There are much better more efficacious medications available for heart failure now and digoxin is not one that really helps reduce mortality.  Would prefer he be evaluated for consideration of Entresto and repeat echo with cardiology.  He is agreeable to this.  I ordered repeat chest x-ray that he can go have done in his ability this week to follow-up on abnormal CT. Pulmonology referral as well was discussed and entered today for emphysema, hemoptysis history recently and shortness of breath evaluation.  For now I will refill his short acting inhaler.      Relevant Orders   Ambulatory referral to Pulmonology   Ambulatory referral to Cardiology   DG Chest 2 View   Pain and swelling of knee, right    Acute onset bursitis a/w frequent exposure to wear and tear. He has a large 4 cm nodule to the anterior shin below the patella. Firm, non-mobile, non-tender, non fluctuant. I will send a referral to orthopedics to consider aspiration given the size. No findings concerning for septic knee today. Conservative recommendation with ice, topical anti-inflammatories, rest, compression and knee protection going forward.      Relevant Orders   Ambulatory referral to Orthopedics   HIV disease ([place])    Well controlled on Cabenuva q61m  All injections have been administered at appropriate treatment interval.  The patient will return as expected for maintenance injections.  Will continue with Q280miral load  monitoring during first 6 months for therapeutic treatment monitoring.  He was not able to stay for labs today unfortunately. I will ask him to come back for lab visit only at his ability vs checking in August when he returns to see GrMarya Amsler   Last VL result:  HIV 1 RNA Quant (Copies/mL)  Date Value  06/15/2020 22 (H)    Dose Interval: q2m11mxt Appointment: 11/16/2020        Other Visit Diagnoses    Human immunodeficiency virus (HIV) disease ([place]  -  Primary   Relevant Medications   cabotegravir & rilpivirine ER (CABENUVA) 600 & 900 MG/3ML injection 1 kit (Completed)   Other Relevant Orders   HIV-1 RNA quant-no reflex-bld   Hemoptysis       Relevant Orders   Ambulatory referral to Pulmonology        Follow-up: RTC in 8 weeks for maintenance injection. Visit scheduled.    SteJanene MadeiraSN, NP-C [facility] Infectious Disease [email or web address] Pager: [phone]: [phone]

## 2020-09-14 NOTE — Assessment & Plan Note (Signed)
Well controlled on Cabenuva q64m.  All injections have been administered at appropriate treatment interval.  The patient will return as expected for maintenance injections.  Will continue with Q22m viral load monitoring during first 6 months for therapeutic treatment monitoring.  He was not able to stay for labs today unfortunately. I will ask him to come back for lab visit only at his ability vs checking in August when he returns to see Tammy Sours.    Last VL result:  HIV 1 RNA Quant (Copies/mL)  Date Value  06/15/2020 22 (H)    Dose Interval: q40m Next Appointment: 11/16/2020

## 2020-09-14 NOTE — Assessment & Plan Note (Signed)
Acute onset bursitis a/w frequent exposure to wear and tear. He has a large 4 cm nodule to the anterior shin below the patella. Firm, non-mobile, non-tender, non fluctuant. I will send a referral to orthopedics to consider aspiration given the size. No findings concerning for septic knee today. Conservative recommendation with ice, topical anti-inflammatories, rest, compression and knee protection going forward.

## 2020-09-14 NOTE — Assessment & Plan Note (Addendum)
Douglas Conway has had some progressive shortness of breath for over a month now.  Sounds like he had possible pneumonia that was seen on CT scan about 3 weeks ago.  I reviewed the ER visit - CT scan at [facility] on 5/15 was negative for PE but showed some mild emphysema, cardiomegaly, concern for aspiration/pneumonitis in the right lingula.  Overall lung assessment is clear without any concern for wheezing or rales. He has irregular heart rate that seems more c/w PVCs - reviewed recent EKG and shows this from 5/22 ER visit.  Upon further review of his medical history I saw a echo in March 2021 with a depressed EF of 35%.  Dmitri tells me that he used to be maintained on once daily digoxin that helped him feel much better and he is wondering if I can refill this for him today.  I explained to him that we should get him into see cardiology and pulmonology given that they can both contribute to his shortness of breath symptoms.  There are much better more efficacious medications available for heart failure now and digoxin is not one that really helps reduce mortality.  Would prefer he be evaluated for consideration of Entresto and repeat echo with cardiology.  He is agreeable to this.  I ordered repeat chest x-ray that he can go have done in his ability this week to follow-up on abnormal CT. Pulmonology referral as well was discussed and entered today for emphysema, hemoptysis history recently and shortness of breath evaluation.  For now I will refill his short acting inhaler.

## 2020-09-14 NOTE — Patient Instructions (Addendum)
We will work on getting you in to see Cardiology, Orthopedic team and Pulmonology team.   The cause of your shortness of breath may be due to heart, lungs or a little bit of both after your recent bout of pneumonia. I don't think you need any more antibiotics but with pneumonia infection it can take several weeks to recover from the inflammation.   Magnesium 200 or 250 mg (depending on the formation at the pharmacy). I am hopeful this will help with the muscle aches you describe. Take it at night.   Voltaren cream for your knees - can use this up to 4 times a day to help the aches of the joints  Please come back on 11/16/2020 to see Tammy Sours again - we will do your labs then.

## 2020-09-17 ENCOUNTER — Ambulatory Visit: Payer: Self-pay

## 2020-09-17 ENCOUNTER — Other Ambulatory Visit: Payer: Self-pay

## 2020-09-17 ENCOUNTER — Ambulatory Visit: Payer: Medicaid Other | Admitting: Cardiology

## 2020-09-17 ENCOUNTER — Ambulatory Visit (INDEPENDENT_AMBULATORY_CARE_PROVIDER_SITE_OTHER): Payer: Medicaid Other | Admitting: Orthopedic Surgery

## 2020-09-17 ENCOUNTER — Encounter: Payer: Self-pay | Admitting: Orthopedic Surgery

## 2020-09-17 ENCOUNTER — Encounter: Payer: Self-pay | Admitting: Cardiology

## 2020-09-17 VITALS — BP 126/90 | HR 82 | Ht 73.0 in | Wt 195.2 lb

## 2020-09-17 DIAGNOSIS — E782 Mixed hyperlipidemia: Secondary | ICD-10-CM

## 2020-09-17 DIAGNOSIS — I10 Essential (primary) hypertension: Secondary | ICD-10-CM

## 2020-09-17 DIAGNOSIS — M7041 Prepatellar bursitis, right knee: Secondary | ICD-10-CM

## 2020-09-17 DIAGNOSIS — M25461 Effusion, right knee: Secondary | ICD-10-CM | POA: Diagnosis not present

## 2020-09-17 DIAGNOSIS — I2510 Atherosclerotic heart disease of native coronary artery without angina pectoris: Secondary | ICD-10-CM

## 2020-09-17 DIAGNOSIS — I255 Ischemic cardiomyopathy: Secondary | ICD-10-CM

## 2020-09-17 MED ORDER — ENTRESTO 24-26 MG PO TABS: 1.0000 | ORAL_TABLET | Freq: Two times a day (BID) | ORAL | 1 refills | Status: DC

## 2020-09-17 MED ORDER — METOPROLOL SUCCINATE ER 25 MG PO TB24: 12.5000 mg | ORAL_TABLET | Freq: Every day | ORAL | 3 refills | Status: DC

## 2020-09-17 NOTE — Progress Notes (Signed)
Date:  09/17/2020   ID:  [name], DOB 05/09/1955, MRN [id number]  Patient Location: Home Provider Location: Home  PCP:  Pcp, No  Cardiologist: Fransico Him, MD  Evaluation Performed:  Followup  Reason for visit: Chronic systolic CHF  History of Present Illness:    Douglas Conway is a 65 y.o. male with prior medical history of ischemic cardiomyopathy, we currently do not have any records however based on his description it appears that at the time the patient was incarcerated, he developed severe acute onset chest pain and it took over 30 hours to take him to the hospital where he was diagnosed with STEMI in LAD distribution, stent was placed in December 2006 at [facility].  At the time his LVEF was 25%, however with medical therapy it improved to over 35% and he has never received an ICD.  The patient followed cardiologist in [place] but had not seen one in at least 5 years.    He reestablished care with Dr. Meda Coffee last year and had been doing well running 3 miles 3-4 times a week without any chest pain or shortness of breath.   He is allergic to Plavix with rash, he is a lifelong smoker but lately only smokes sporadically, previously pack a day.  He also has a history of hyperthyroidism that was diagnosed at the time of STEMI.  At last OV a year ago he was applying for insurance and could not afford his medications.He was changed from Pravastatin to Crestor. He was supposed to followup to get on ENtresto after repeat echo showed persistence of EF 30-35% but due to issues with insurance he never followed up.   He is here today for followup and is doing well.  He now has Medicaid and says he should be able to get meds.  He has chronic DOE and is supposed to see Pulmonary due to worsening of his breathing on a daily basis.  He sometimes has some problems with PND and orthopnea.  He also has had LE edema.  He denies any chest pain or pressure, dizziness, palpitations or syncope. He is compliant with  His meds and is tolerating meds with no SE.    Past Medical History:  Diagnosis Date   Change in hearing, bilateral 8/98/4210   Chronic systolic congestive heart failure ([place]) 05/14/2019   Coronary artery disease    HIV (human immunodeficiency virus infection) ([place])    HIV disease ([place]) 06/28/2018   Hypertension 06/28/2018   Lateral epicondylitis 01/07/2019   Other specified hypothyroidism 01/07/2019   Thyroid disease    Past Surgical History:  Procedure Laterality Date   CORONARY ANGIOPLASTY WITH STENT PLACEMENT      Current Meds  Medication Sig   albuterol (VENTOLIN HFA) 108 (90 Base) MCG/ACT inhaler Inhale 2 puffs into the lungs every 6 (six) hours as needed for wheezing or shortness of breath.   cabotegravir & rilpivirine ER (CABENUVA) 600 & 900 MG/3ML injection INJECT 1 KIT INTO THE MUSCLE EVERY 8 (EIGHT) WEEKS.   Cholecalciferol 125 MCG (5000 UT) capsule Take 1 capsule (5,000 Units total) by mouth daily.   levothyroxine (SYNTHROID) 175 MCG tablet TAKE 1 TABLET BY MOUTH ONCE DAILY BEFORE BREAKFAST   rosuvastatin (CRESTOR) 20 MG tablet Take 1 tablet by mouth once daily    Allergies:   Plavix [clopidogrel bisulfate]   Social History   Tobacco Use   Smoking status: Some Days    Packs/day: 0.10    Pack years: 0.00  Types: Cigarettes    Start date: 01/07/1968   Smokeless tobacco: Never   Tobacco comments:    cutting back  Vaping Use   Vaping Use: Never used  Substance Use Topics   Alcohol use: Yes    Alcohol/week: 12.0 standard drinks    Types: 12 Cans of beer per week   Drug use: Never     Family Hx: The patient's family history is not on file.  ROS:   Please see the history of present illness.    All other systems reviewed and are negative.   Prior CV studies:   The following studies were reviewed today:  Labs/Other Tests and Data Reviewed:    EKG:  EKG was not performed today  Recent Labs: 03/18/2020: ALT 30 08/22/2020: BUN 25; Creatinine, Ser 1.04;  Hemoglobin 13.3; Platelets 255; Potassium 3.7; Sodium 135   Recent Lipid Panel Lab Results  Component Value Date/Time   CHOL 106 03/18/2020 02:57 PM   CHOL 196 06/12/2019 10:53 AM   TRIG 130 03/18/2020 02:57 PM   HDL 42 03/18/2020 02:57 PM   HDL 57 06/12/2019 10:53 AM   CHOLHDL 2.5 03/18/2020 02:57 PM   LDLCALC 43 03/18/2020 02:57 PM    Wt Readings from Last 3 Encounters:  09/17/20 195 lb 3.2 oz (88.5 kg)  09/14/20 189 lb (85.7 kg)  08/23/20 182 lb 15.7 oz (83 kg)     Objective:    Vital Signs:  Ht '6\' 1"'  (1.854 m)   Wt 195 lb 3.2 oz (88.5 kg)   BMI 25.75 kg/m    GEN: Well nourished, well developed in no acute distress HEENT: Normal NECK: No JVD; No carotid bruits LYMPHATICS: No lymphadenopathy CARDIAC:RRR, no murmurs, rubs, gallops RESPIRATORY:  Clear to auscultation without rales, wheezing or rhonchi  ABDOMEN: Soft, non-tender, non-distended MUSCULOSKELETAL:  No edema; No deformity  SKIN: Warm and dry NEUROLOGIC:  Alert and oriented x 3 PSYCHIATRIC:  Normal affect    ASSESSMENT & PLAN:    ASCAD -history of STEMI in 2006 with stenting to LAD, -he denies any anginal symptoms and continues to exercise with no problems.  Continue prescription drug management with ASA 46m daily and Crestor 272mdaily>>refilled for 1 year -I would like him to be on a BB >> he was on Carvedilol but now is off of that -with his lung issues I think we should start Toprol XL 12.35m29maily which is Beta 1 selective.    2. Ischemic cardiomyopathy/Chronic systolic CHF -post STEMI 25%32%proved to about 35% -repeat 2D echo 06/2019 showed EF 30-35% with global HK with moderate PHTN -he failed to followup after echo to get on GDMT with Entresto and spiro as he had no insurance -he now has Medicaid -will start Entresto 24-40m23mD and Toprol XL 12.35mg 40mly -followup with Pharm D in 2 weeks for uptitration of Entresto -consider addition of Farxiga and spiro -once on max tolerated GMDT for HF  will repeat 2D echo and if no improvement in EF then will refer to EP for ICD  3.  Hypertension  -DBP mildly elevated on exam today -will start Entresto 24-40mg 66mand Toprol XL 12.35mg da49m -check BMET in 1 week  4.  HLD - goal < 70 -I have personally reviewed and interpreted outside labs performed by patient's PCP which showed LDL 43, HDL 42, ALT 30 in Dec 2021 -Continue prescription drug management with Crestor 20mg da22m    Medication Adjustments/Labs and Tests Ordered: Current medicines are reviewed at  length with the patient today.  Concerns regarding medicines are outlined above.   Tests Ordered: No orders of the defined types were placed in this encounter.   Medication Changes: No orders of the defined types were placed in this encounter.   Follow Up:  In Person in 2 month(s)  Signed, Fransico Him, MD  09/17/2020 3:14 PM    [facility]

## 2020-09-17 NOTE — Patient Instructions (Signed)
Medication Instructions:  Your physician has recommended you make the following change in your medication: 1) START taking Entresto 24-26 twice daily 2) START taking Toprol (metoprolol succinate) 12.5 mg once daily  *If you need a refill on your cardiac medications before your next appointment, please call your pharmacy*   Lab Work: BMET in one week  If you have labs (blood work) drawn today and your tests are completely normal, you will receive your results only by: MyChart Message (if you have MyChart) OR A paper copy in the mail If you have any lab test that is abnormal or we need to change your treatment, we will call you to review the results.  Follow-Up: At [facility], you and your health needs are our priority.  As part of our continuing mission to provide you with exceptional heart care, we have created designated Provider Care Teams.  These Care Teams include your primary Cardiologist (physician) and Advanced Practice Providers (APPs -  Physician Assistants and Nurse Practitioners) who all work together to provide you with the care you need, when you need it.  Your next appointment:   6 week(s)  The format for your next appointment:   In Person  Provider:   You may see Armanda Magic, MD or one of the following Advanced Practice Providers on your designated Care Team:   Ronie Spies, PA-C Jacolyn Reedy, PA-C   Other Instructions You have been referred to see our Pharmacist for management of your heart failure medications in two weeks.

## 2020-09-17 NOTE — Progress Notes (Signed)
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  Symptoms have been present for about a month.  Assessment & Plan: Visit Diagnoses:  1. Swelling of right knee joint   2. Prepatellar bursitis of right knee     Plan: No clinical signs of abscess.  Recommended compression antibiotic ointment and use knee pads for his work.  Follow-Up Instructions: Return if symptoms worsen or fail to improve.   Ortho Exam  Patient is alert, oriented, no adenopathy, well-dressed, normal affect, normal respiratory effort. Examination there is no effusion of the right knee no pain with range of motion he does have a bursal swelling approximately 4 cm in diameter over the prepatellar bursa.  This was sterilely prepped the skin was injected with 1 cc of 1% lidocaine plain 18-gauge needle was used to try to aspirate the prepatellar bursa but there was no fluid a 15 blade knife was used to then insert in line with the flexion creases there was a very small amount of serosanguineous drainage but no purulence no signs of infection Bactroban dry dressing and Ace wrap was applied.  Imaging: XR Knee 1-2 Views Right  Result Date: 09/17/2020 2 view radiographs of the right knee show some swelling over the prepatellar bursa.  There is no joint effusion no destructive bony changes the joint spaces are congruent.  No images are attached to the encounter.  Labs: Lab Results  Component Value Date   ESRSEDRATE 11 08/26/2019   CRP 7.0 08/26/2019   REPTSTATUS 08/24/2019 FINAL 08/19/2019    REPTSTATUS 08/24/2019 FINAL 08/19/2019   CULT  08/19/2019    NO GROWTH 5 DAYS Performed at [facility], [address] [place] [place], [place] [address]    CULT  08/19/2019    NO GROWTH 5 DAYS Performed at [facility], [address] [place], [place] [address]    [facility] ESCHERICHIA COLI (A) 03/18/2018     Lab Results  Component Value Date   ALBUMIN 3.9 08/18/2019   ALBUMIN 4.4 06/12/2019   ALBUMIN 4.3 09/06/2017    No results found for: MG Lab Results  Component Value Date   VD25OH 25.4 (L) 06/12/2019    No results found for: PREALBUMIN CBC EXTENDED Latest Ref Rng & Units 08/22/2020 08/20/2019 08/19/2019  WBC 4.0 - 10.5 K/uL 9.5 8.3 10.8(H)  RBC 4.22 - 5.81 MIL/uL 4.44 4.64 4.78  HGB 13.0 - 17.0 g/dL 66.5 99.3 57.0  HCT 17.7 - 52.0 % 39.2 42.0 44.2  PLT 150 - 400 K/uL 255 272 273  NEUTROABS 1.7 - 7.7 K/uL - - -  LYMPHSABS 0.7 - 4.0 K/uL - - -     There is no height or weight on file to calculate BMI.  Orders:  Orders Placed This Encounter  Procedures   XR Knee 1-2 Views Right   No orders of the  defined types were placed in this encounter.    Procedures: No procedures performed  Clinical Data: No additional findings.  ROS:  All other systems negative, except as noted in the HPI. Review of Systems  Objective: Vital Signs: There were no vitals taken for this visit.  Specialty Comments:  No specialty comments available.  PMFS History: Patient Active Problem List   Diagnosis Date Noted   Shortness of breath 09/14/2020   Pain and swelling of knee, right 09/14/2020   Sciatica of left side 08/26/2019   Chronic systolic congestive heart failure (HCC) 05/14/2019   Other specified hypothyroidism 01/07/2019   HIV disease (HCC) 06/28/2018   Change in hearing, bilateral 06/28/2018   Hypertension 06/28/2018   Healthcare maintenance 06/28/2018   Past Medical History:  Diagnosis Date   Change in hearing, bilateral 06/28/2018   Chronic  systolic congestive heart failure (HCC) 05/14/2019   Coronary artery disease    HIV (human immunodeficiency virus infection) (HCC)    HIV disease (HCC) 06/28/2018   Hypertension 06/28/2018   Lateral epicondylitis 01/07/2019   Other specified hypothyroidism 01/07/2019   Thyroid disease     History reviewed. No pertinent family history.  Past Surgical History:  Procedure Laterality Date   CORONARY ANGIOPLASTY WITH STENT PLACEMENT     Social History   Occupational History   Not on file  Tobacco Use   Smoking status: Some Days    Packs/day: 0.10    Pack years: 0.00    Types: Cigarettes    Start date: 01/07/1968   Smokeless tobacco: Never   Tobacco comments:    cutting back  Vaping Use   Vaping Use: Never used  Substance and Sexual Activity   Alcohol use: Yes    Alcohol/week: 12.0 standard drinks    Types: 12 Cans of beer per week   Drug use: Never   Sexual activity: Not Currently    Partners: Female    Birth control/protection: Condom    Comment: given condoms 09/14/20

## 2020-09-17 NOTE — Addendum Note (Signed)
Addended by: Theresia Majors on: 09/17/2020 03:49 PM   Modules accepted: Orders

## 2020-09-18 ENCOUNTER — Telehealth: Payer: Self-pay

## 2020-09-18 NOTE — Telephone Encounter (Signed)
**Note De-Identified Douglas Conway Obfuscation** I started an [place] PA through covermymeds. Key: [id number]

## 2020-09-23 NOTE — Telephone Encounter (Signed)
Pt calling inquiring about pt assistance for Entresto. I informed pt that his application has been sent and awaiting approval and I asked pt if he has used the 30 day free card and pt stated that he has not. I informed pt that I will leave a 30 day free card for him to pick up at the front desk and that he needed to activate the card first, before taking it to the pharmacy. Pt verbalized understanding. FYI

## 2020-09-24 ENCOUNTER — Institutional Professional Consult (permissible substitution): Payer: Medicaid Other | Admitting: Internal Medicine

## 2020-09-24 NOTE — Telephone Encounter (Addendum)
**Note De-Identified Douglas Conway Obfuscation** I called the pt and he states that his cost is $700/30 day supply of Entreso and cannot afford as he is on Medicaid.  We discussed pt asst through [employer] pt asst [employer] and he is interested in applying. I gave him [employer] pt asst's phone number and he states he will call them today to discuss his eligibility to be approved for their program and that if it appears that he would be to request that they mail him a application to his home address. He is aware that once he receives his application to complete his part, obtain required documents per [employer], and to bring all to Dr Gareth Morgan office to drop off and we will take care of the provider page of the application and will fax all to [employer] Pt Asst [employer].  He thanked me for calling him to discuss.

## 2020-09-25 ENCOUNTER — Other Ambulatory Visit: Payer: Medicaid Other

## 2020-09-30 ENCOUNTER — Telehealth: Payer: Self-pay | Admitting: Cardiology

## 2020-09-30 NOTE — Telephone Encounter (Signed)
Spoke wit the patient who states that a day or two after starting Entresto his back starting hurting. He states the pain is on both sides near his kidneys. He denies any injury to his back. Denies any other symptoms. He was supposed to have BMET done last week but did not show. I have rescheduled him for lab work for tomorrow.

## 2020-09-30 NOTE — Telephone Encounter (Signed)
Spoke with the patient and he will get in to see his PCP>

## 2020-09-30 NOTE — Telephone Encounter (Signed)
Pt c/o medication issue:  1. Name of Medication: sacubitril-valsartan (ENTRESTO) 24-26 MG  2. How are you currently taking this medication (dosage and times per day)? 1 tablet twice a day  3. Are you having a reaction (difficulty breathing--STAT)? no  4. What is your medication issue? Patient states he has been having a pain in his back by his kidney's since starting the medication. He states he can hardly stand or do anything using his back. He states it is on the left and right side of his spine. He states he has no other symptoms.

## 2020-10-01 ENCOUNTER — Other Ambulatory Visit: Payer: Medicaid Other

## 2020-10-01 ENCOUNTER — Other Ambulatory Visit: Payer: Self-pay

## 2020-10-01 DIAGNOSIS — I10 Essential (primary) hypertension: Secondary | ICD-10-CM

## 2020-10-01 DIAGNOSIS — I2510 Atherosclerotic heart disease of native coronary artery without angina pectoris: Secondary | ICD-10-CM

## 2020-10-01 LAB — BASIC METABOLIC PANEL
BUN/Creatinine Ratio: 22 (ref 10–24)
BUN: 26 mg/dL (ref 8–27)
CO2: 22 mmol/L (ref 20–29)
Calcium: 9.4 mg/dL (ref 8.6–10.2)
Chloride: 105 mmol/L (ref 96–106)
Creatinine, Ser: 1.19 mg/dL (ref 0.76–1.27)
Glucose: 92 mg/dL (ref 65–99)
Potassium: 4.3 mmol/L (ref 3.5–5.2)
Sodium: 141 mmol/L (ref 134–144)
eGFR: 69 mL/min/{1.73_m2} (ref >59)

## 2020-10-02 NOTE — Telephone Encounter (Signed)
Form signed by Dr. Elberta Fortis (DOD) and faxed to [employer].

## 2020-10-02 NOTE — Telephone Encounter (Signed)
**Note De-Identified Douglas Conway Obfuscation** A completed [employer] pt asst application was left at the office. Dr Mayford Knife is out of the office until 7/6. I have completed the provider page of the application and email it to so she can obtain signature, date it, and to fax to [employer] pt asst at the fax number written on the cover letter included or to place in the "to be faxed box" in Medical Records to be faxed.

## 2020-10-07 ENCOUNTER — Other Ambulatory Visit: Payer: Self-pay

## 2020-10-07 ENCOUNTER — Ambulatory Visit: Payer: Medicaid Other | Admitting: Pharmacist

## 2020-10-07 ENCOUNTER — Observation Stay
Admission: EM | Admit: 2020-10-07 | Discharge: 2020-10-08 | Discharge status: Against Medical Advice | Payer: Medicaid Other | Attending: Internal Medicine | Admitting: Internal Medicine

## 2020-10-07 ENCOUNTER — Emergency Department: Payer: Medicaid Other

## 2020-10-07 DIAGNOSIS — Z955 Presence of coronary angioplasty implant and graft: Secondary | ICD-10-CM | POA: Diagnosis not present

## 2020-10-07 DIAGNOSIS — I252 Old myocardial infarction: Secondary | ICD-10-CM | POA: Insufficient documentation

## 2020-10-07 DIAGNOSIS — F17210 Nicotine dependence, cigarettes, uncomplicated: Secondary | ICD-10-CM | POA: Diagnosis not present

## 2020-10-07 DIAGNOSIS — E039 Hypothyroidism, unspecified: Secondary | ICD-10-CM | POA: Diagnosis not present

## 2020-10-07 DIAGNOSIS — B20 Human immunodeficiency virus [HIV] disease: Secondary | ICD-10-CM | POA: Insufficient documentation

## 2020-10-07 DIAGNOSIS — I5023 Acute on chronic systolic (congestive) heart failure: Secondary | ICD-10-CM | POA: Diagnosis not present

## 2020-10-07 DIAGNOSIS — R0789 Other chest pain: Secondary | ICD-10-CM | POA: Diagnosis present

## 2020-10-07 DIAGNOSIS — I200 Unstable angina: Secondary | ICD-10-CM | POA: Insufficient documentation

## 2020-10-07 DIAGNOSIS — I472 Ventricular tachycardia, unspecified: Secondary | ICD-10-CM

## 2020-10-07 DIAGNOSIS — I110 Hypertensive heart disease with heart failure: Secondary | ICD-10-CM | POA: Diagnosis not present

## 2020-10-07 DIAGNOSIS — R000 Tachycardia, unspecified: Secondary | ICD-10-CM

## 2020-10-07 DIAGNOSIS — Z20822 Contact with and (suspected) exposure to covid-19: Secondary | ICD-10-CM | POA: Insufficient documentation

## 2020-10-07 DIAGNOSIS — Z79899 Other long term (current) drug therapy: Secondary | ICD-10-CM | POA: Diagnosis not present

## 2020-10-07 HISTORY — DX: ST elevation (STEMI) myocardial infarction of unspecified site: I21.3

## 2020-10-07 HISTORY — DX: Patient's noncompliance with other medical treatment and regimen due to unspecified reason: Z91.199

## 2020-10-07 HISTORY — DX: Tobacco use: Z72.0

## 2020-10-07 HISTORY — DX: Patient's noncompliance with other medical treatment and regimen: Z91.19

## 2020-10-07 HISTORY — DX: Hyperlipidemia, unspecified: E78.5

## 2020-10-07 LAB — BASIC METABOLIC PANEL
Anion gap: 7 (ref 5–15)
BUN: 27 mg/dL — ABNORMAL HIGH (ref 8–23)
CO2: 23 mmol/L (ref 22–32)
Calcium: 8.7 mg/dL — ABNORMAL LOW (ref 8.9–10.3)
Chloride: 111 mmol/L (ref 98–111)
Creatinine, Ser: 1.29 mg/dL — ABNORMAL HIGH (ref 0.61–1.24)
GFR, Estimated: >60 mL/min (ref >60)
Glucose, Bld: 211 mg/dL — ABNORMAL HIGH (ref 70–99)
Potassium: 4.3 mmol/L (ref 3.5–5.1)
Sodium: 141 mmol/L (ref 135–145)

## 2020-10-07 LAB — CBC WITH DIFFERENTIAL/PLATELET
Abs Immature Granulocytes: 0.02 10*3/uL (ref 0.00–0.07)
Basophils Absolute: 0.1 10*3/uL (ref 0.0–0.1)
Basophils Relative: 1 %
Eosinophils Absolute: 0.1 10*3/uL (ref 0.0–0.5)
Eosinophils Relative: 3 %
HCT: 42.2 % (ref 39.0–52.0)
Hemoglobin: 14 g/dL (ref 13.0–17.0)
Immature Granulocytes: 0 %
Lymphocytes Relative: 40 %
Lymphs Abs: 1.9 10*3/uL (ref 0.7–4.0)
MCH: 30.6 pg (ref 26.0–34.0)
MCHC: 33.2 g/dL (ref 30.0–36.0)
MCV: 92.3 fL (ref 80.0–100.0)
Monocytes Absolute: 0.1 10*3/uL (ref 0.1–1.0)
Monocytes Relative: 3 %
Neutro Abs: 2.5 10*3/uL (ref 1.7–7.7)
Neutrophils Relative %: 53 %
Platelets: 195 10*3/uL (ref 150–400)
RBC: 4.57 MIL/uL (ref 4.22–5.81)
RDW: 14.7 % (ref 11.5–15.5)
WBC: 4.7 10*3/uL (ref 4.0–10.5)
nRBC: 0 % (ref 0.0–0.2)

## 2020-10-07 LAB — PROTIME-INR
INR: 1 (ref 0.8–1.2)
Prothrombin Time: 13.6 seconds (ref 11.4–15.2)

## 2020-10-07 LAB — APTT: aPTT: 22 seconds — ABNORMAL LOW (ref 24–36)

## 2020-10-07 LAB — TSH: TSH: 65.394 u[IU]/mL — ABNORMAL HIGH (ref 0.350–4.500)

## 2020-10-07 LAB — MAGNESIUM: Magnesium: 2.1 mg/dL (ref 1.7–2.4)

## 2020-10-07 LAB — T4, FREE: Free T4: 0.74 ng/dL (ref 0.61–1.12)

## 2020-10-07 LAB — TROPONIN I (HIGH SENSITIVITY): Troponin I (High Sensitivity): 30 ng/L — ABNORMAL HIGH (ref <18)

## 2020-10-07 MED ORDER — AMIODARONE HCL IN DEXTROSE 360-4.14 MG/200ML-% IV SOLN
30.0000 mg/h | INTRAVENOUS | Status: DC
  Administered 2020-10-08 (×2): 30 mg/h via INTRAVENOUS
  Filled 2020-10-07: qty 200

## 2020-10-07 MED ORDER — HEPARIN (PORCINE) 25000 UT/250ML-% IV SOLN
1100.0000 [IU]/h | INTRAVENOUS | Status: DC
  Administered 2020-10-07: 1100 [IU]/h via INTRAVENOUS
  Filled 2020-10-07: qty 250

## 2020-10-07 MED ORDER — DOCUSATE SODIUM 100 MG PO CAPS: 100.0000 mg | ORAL_CAPSULE | Freq: Two times a day (BID) | ORAL | Status: DC | PRN

## 2020-10-07 MED ORDER — HEPARIN SODIUM (PORCINE) 5000 UNIT/ML IJ SOLN: 4000.0000 [IU] | INTRAMUSCULAR | Status: DC

## 2020-10-07 MED ORDER — HEPARIN BOLUS VIA INFUSION
4000.0000 [IU] | Freq: Once | INTRAVENOUS | Status: AC
  Administered 2020-10-07: 4000 [IU] via INTRAVENOUS
  Filled 2020-10-07: qty 4000

## 2020-10-07 MED ORDER — AMIODARONE HCL IN DEXTROSE 360-4.14 MG/200ML-% IV SOLN
60.0000 mg/h | INTRAVENOUS | Status: AC
  Administered 2020-10-07: 60 mg/h via INTRAVENOUS
  Filled 2020-10-07 (×2): qty 200

## 2020-10-07 MED ORDER — POLYETHYLENE GLYCOL 3350 17 G PO PACK: 17.0000 g | PACK | Freq: Every day | ORAL | Status: DC | PRN

## 2020-10-07 NOTE — ED Notes (Signed)
XR at bedside

## 2020-10-07 NOTE — Progress Notes (Signed)
ANTICOAGULATION CONSULT NOTE  Pharmacy Consult for heparin infusion Indication: ACS/STEMI  Allergies  Allergen Reactions   Plavix [Clopidogrel Bisulfate] Rash    Patient Measurements: Height: 6\' 1"  (185.4 cm) Weight: 83.9 kg (185 lb) IBW/kg (Calculated) : 79.9 Heparin Dosing Weight: 83.9 kg  Vital Signs: Temp: 97.6 F (36.4 C) (06/29 2235) Temp Source: Oral (06/29 2235) BP: 119/93 (06/29 2231) Pulse Rate: 67 (06/29 2231)  Labs: Recent Labs    10/07/20 2238  HGB 14.0  HCT 42.2  PLT 195  APTT 22*  LABPROT 13.6  INR 1.0    Estimated Creatinine Clearance: 72.7 mL/min (by C-G formula based on SCr of 1.19 mg/dL).   Medical History: Past Medical History:  Diagnosis Date   Change in hearing, bilateral 06/28/2018   Chronic systolic congestive heart failure (HCC) 05/14/2019   Coronary artery disease    HIV (human immunodeficiency virus infection) (HCC)    HIV disease (HCC) 06/28/2018   Hypertension 06/28/2018   Lateral epicondylitis 01/07/2019   Other specified hypothyroidism 01/07/2019   STEMI (ST elevation myocardial infarction) [facility])    Thyroid disease     Assessment: Pt is 65 yo male arriving to ED via EMS from home for c/o rapid wide complex tachycardia with chest pain.  Pt initially appeared to be in [place] so EMS cardioverted at 100J. Pt converted into a sinus rhythm and chest pain resolved. Pt has hx of MI in 2006.  Goal of Therapy:  Heparin level 0.3-0.7 units/ml Monitor platelets by anticoagulation protocol: Yes   Plan:  Give 4000 units bolus x 1 Start heparin infusion at 1100 units/hr Check anti-Xa level in 6 hours and daily while on heparin Continue to monitor H&H and platelets  2007, PharmD, [facility] 10/07/2020 11:09 PM

## 2020-10-07 NOTE — Telephone Encounter (Signed)
Copy of patient's insurance card has been faxed.

## 2020-10-07 NOTE — ED Notes (Signed)
Admitting at bedside 

## 2020-10-07 NOTE — Consult Note (Signed)
Brief cardiology interventional consult note Asked to see patient for code STEMI EKG obtained in the field diffuse ST depression mild ST elevation in aVR Patient reportedly presented with diaphoresis sweating lightheaded dizziness possible syncope and chest pain Known history of coronary disease including PCI and stent, previous MI Known ischemic cardiomyopathy ejection fraction between 30 and 35% Patient had a wide-complex tachycardia rhythm of around 180 cardioversion in the field Patient states after cardioversion he began to improve chest pain resolved diaphoresis fatigue weakness also improved By 10 the patient was in emergency room he felt back to normal EKG had significant improvement he was clinically stable vital signs and symptoms Blood pressure 120 systolic heart rate of 75 respiratory rate of 60 EKGs continue to improve with no ST-T segment changes no chest pain After evaluating examining patient I decided to cancel code STEMI  Impression Wide-complex tachycardia probably ventricular tachycardia Status post cardioversion Ischemic cardiomyopathy EF around 30 to 35% Coronary disease Hyperlipidemia HIV  Plan Recommend admit to telemetry or ICU Follow-up EKGs and troponins Recommend echocardiogram for assessment of left ventricular function wall motion Continue amiodarone loading drip IV heparin 24 to 48 hours Continue Entresto Recommend Coreg or metoprolol Consider cardiac cath prior to discharge Consider AICD placement and EP evaluation We will transfer care back to see [facility] as his primary cardiologist is Dr. Turner in [place] 

## 2020-10-07 NOTE — Progress Notes (Deleted)
Patient ID: Douglas Conway                 DOB: 18-Dec-1955                      MRN: [id number]     HPI: Douglas Conway is a 65 y.o. male referred by Dr. Radford Conway to pharmacy clinic for HF medication management. PMH is significant for iCMP, STEMI s/p DES to LAD in 2006. LVEF at the time was 25% but has since improved to 35%. Also has hx of HIV on Cabenuva injections and chronic DOE. Most recent LVEF 30-35% on 06/2019. Pt last seen by Dr Douglas Conway 09/17/20. He was started on Entresto 24-20m BID and Toprol 12.543mdaily. Referred to PharmD clinic for follow up med titration, plan to add Farxiga and spironolactone for GDMT as well. Pt reported pain by his kidneys after starting Entresto. BMET checked and was stable, pt advised to follow up with PCP.  Today she returns to pharmacy clinic for further medication titration. At last visit with MD ***. Symptomatically, she is feeling ***, *** dizziness, lightheadedness, and fatigue. *** chest pain or palpitations. Feels SOB when ***. Able to complete all ADLs. Activity level ***. She *** checks her weight at home (normal range *** - *** lbs). *** LEE, PND, or orthopnea. Appetite has been ***. She *** adheres to a low-salt diet.  MI at 4664 goal < 55 LDL 43 last dec continue rosuva 20 Medicaid insurance, why was entresto expensive? Where is he getting it from now-see insurance card Has edema and chronic DOE (smoking still) - [employer] both meds, also needs farxiga and spiro Refill rosuva   Current CHF meds:  Entresto 24-2622mID Toprol 12.5mg82mily  Previously tried: carvedilol - avoiding with pt's lung issues  BP goal: <130/80mm40mFamily History: Not on file.  Social History: Previously smoked 1 PPD, still smoking sporadically. 12 cans of beer per week, denies drug use.  Diet:   Exercise: Runs 3 miles 3-4x per week  Home BP readings:   Wt Readings from Last 3 Encounters:  09/17/20 195 lb 3.2 oz (88.5 kg)  09/14/20 189 lb (85.7 kg)  08/23/20 182 lb 15.7  oz (83 kg)   BP Readings from Last 3 Encounters:  09/17/20 126/90  09/14/20 (!) 139/91  08/23/20 126/84   Pulse Readings from Last 3 Encounters:  09/17/20 82  09/14/20 86  08/23/20 88    Renal function: CrCl cannot be calculated (Unknown ideal weight.).  Past Medical History:  Diagnosis Date   Change in hearing, bilateral 3/19/4/19/3790ronic systolic congestive heart failure (HCC) [place]/2021   Coronary artery disease    HIV (human immunodeficiency virus infection) (HCC) [place] disease (HCC) [place]/2020   Hypertension 06/28/2018   Lateral epicondylitis 01/07/2019   Other specified hypothyroidism 01/07/2019   Thyroid disease     Current Outpatient Medications on File Prior to Visit  Medication Sig Dispense Refill   albuterol (VENTOLIN HFA) 108 (90 Base) MCG/ACT inhaler Inhale 2 puffs into the lungs every 6 (six) hours as needed for wheezing or shortness of breath. 8 g 2   cabotegravir & rilpivirine ER (CABENUVA) 600 & 900 MG/3ML injection INJECT 1 KIT INTO THE MUSCLE EVERY 8 (EIGHT) WEEKS. 6 mL 6   Cholecalciferol 125 MCG (5000 UT) capsule Take 1 capsule (5,000 Units total) by mouth daily. 90 capsule 1   levothyroxine (SYNTHROID) 175 MCG tablet TAKE 1 TABLET BY  MOUTH ONCE DAILY BEFORE BREAKFAST 90 tablet 0   metoprolol succinate (TOPROL XL) 25 MG 24 hr tablet Take 0.5 tablets (12.5 mg total) by mouth daily. 45 tablet 3   rosuvastatin (CRESTOR) 20 MG tablet Take 1 tablet by mouth once daily 90 tablet 1   sacubitril-valsartan (ENTRESTO) 24-26 MG Take 1 tablet by mouth 2 (two) times daily. 60 tablet 1   No current facility-administered medications on file prior to visit.    Allergies  Allergen Reactions   Plavix [Clopidogrel Bisulfate] Rash     Assessment/Plan:  1. CHF -

## 2020-10-07 NOTE — ED Provider Notes (Addendum)
[facility] Emergency Department Provider Note  ____________________________________________   Event Date/Time   First MD Initiated Contact with Patient 10/07/20 2237     (approximate)  I have reviewed the triage vital signs and the nursing notes.   HISTORY  Chief Complaint Chest Pain and Tachycardia    HPI Douglas Conway is a 65 y.o. male with history of CHF, HIV who comes in with tachycardia.  Patient reports having a stent placed in 2006 for a STEMI.  At the time of his STEMI his heart function was 25% but after medical therapy had improved to 35% and he never received an ICD.  Patient had not seen a cardiologist in 5 years and then started to follow-up with Dr. Radford Pax cardiology.  Patient is a allergic to Plavix.  And has a history of hypothyroid and is on that was diagnosed at the time of STEMI.  Patient reported that he was outside working when he had sudden onset of shortness of breath, feeling diaphoretic.  He reports kneeling on his knees and then having brief LOC where he woke up and was on the ground.  He thinks he laid himself down onto the ground.  He did not hit his head.  Denies any headaches, confusion.  Not on any blood thinners.  He then walked to the bathroom because he felt like he was can have a bowel movement.  He felt very diaphoretic.  When EMS got there he was gray, no radial pulse.  Patient found to be in wide-complex tachycardia so he was cardioverted at 100 J into normal sinus rhythm.  Patient was given 150 of amnio, full dose of aspirin.  Patient states after cardioversion he felt complete relief in symptoms.  He denies any unilateral leg swelling  Pt followed by cardiology [facility].       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/2019   Thyroid disease     Patient Active Problem List   Diagnosis Date Noted   Shortness of breath 09/14/2020   Pain and swelling of knee, right 09/14/2020   Sciatica of left side 40/01/2724   Chronic systolic congestive heart failure ([place]) 05/14/2019   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 Authorizing Provider  albuterol (VENTOLIN HFA) 108 (90 Base) MCG/ACT inhaler Inhale 2 puffs into the lungs every 6 (six) hours as needed for wheezing or shortness of breath. 08/22/20   Nance Pear, MD  cabotegravir & rilpivirine ER (CABENUVA) 600 & 900 MG/3ML injection INJECT 1 KIT INTO THE MUSCLE EVERY 8 (EIGHT) WEEKS. 06/15/20 06/15/21  [place] Callas, NP  Cholecalciferol 125 MCG (5000 UT) capsule Take 1 capsule (5,000 Units total) by mouth daily. 06/13/19   Dorothy Spark, MD  levothyroxine (SYNTHROID) 175 MCG tablet TAKE 1 TABLET BY MOUTH ONCE DAILY BEFORE BREAKFAST 03/19/20   Dorothy Spark, MD  metoprolol succinate (TOPROL XL)  25 MG 24 hr tablet Take 0.5 tablets (12.5 mg total) by mouth daily. 09/17/20   Sueanne Margarita, MD  rosuvastatin (CRESTOR) 20 MG tablet Take 1 tablet by mouth once daily 11/15/19   Dorothy Spark, MD  sacubitril-valsartan (ENTRESTO) 24-26 MG Take 1 tablet by mouth 2 (two) times daily. 09/17/20   Sueanne Margarita, MD    Allergies Plavix [clopidogrel bisulfate]  No family history on file.  Social History Social History   Tobacco Use   Smoking status: Some Days    Packs/day: 0.10    Pack years: 0.00    Types: Cigarettes    Start date: 01/07/1968    Smokeless tobacco: Never   Tobacco comments:    cutting back  Vaping Use   Vaping Use: Never used  Substance Use Topics   Alcohol use: Yes    Alcohol/week: 12.0 standard drinks    Types: 12 Cans of beer per week   Drug use: Never      Review of Systems Constitutional: No fever/chills, positive diaphoresis Eyes: No visual changes. ENT: No sore throat. Cardiovascular: Positive chest pain Respiratory: Positive shortness of breath  Gastrointestinal: No abdominal pain.  No nausea, no vomiting.  No diarrhea.  No constipation. Genitourinary: Negative for dysuria. Musculoskeletal: Negative for back pain. Skin: Negative for rash. Neurological: Negative for headaches, focal weakness or numbness. All other ROS negative ____________________________________________   PHYSICAL EXAM:  VITAL SIGNS: ED Triage Vitals  Enc Vitals Group     BP 10/07/20 2231 (!) 119/93     Pulse Rate 10/07/20 2231 67     Resp 10/07/20 2231 20     Temp 10/07/20 2235 97.6 F (36.4 C)     Temp Source 10/07/20 2235 Oral     SpO2 10/07/20 2235 98 %     Weight 10/07/20 2236 185 lb (83.9 kg)     Height 10/07/20 2236 6' 1" (1.854 m)     Head Circumference --      Peak Flow --      Pain Score 10/07/20 2237 0     Pain Loc --      Pain Edu? --      Excl. in [place]? --     Constitutional: Alert and oriented. Well appearing and in no acute distress. Eyes: Conjunctivae are normal. EOMI. Head: Atraumatic. Nose: No congestion/rhinnorhea. Mouth/Throat: Mucous membranes are moist.   Neck: No stridor. Trachea Midline. FROM Cardiovascular: Normal rate, regular rhythm. Grossly normal heart sounds.  Good peripheral circulation. Respiratory: Normal respiratory effort.  No retractions. Lungs CTAB. Gastrointestinal: Soft and nontender. No distention. No abdominal bruits.  Musculoskeletal: No lower extremity tenderness nor edema.  No joint effusions. Neurologic:  Normal speech and language. No gross focal neurologic  deficits are appreciated.  Skin:  Skin is warm, dry and intact. No rash noted. Psychiatric: Mood and affect are normal. Speech and behavior are normal. GU: Deferred   ____________________________________________   LABS (all labs ordered are listed, but only abnormal results are displayed)  Labs Reviewed  CBC WITH DIFFERENTIAL/PLATELET  BASIC METABOLIC PANEL  MAGNESIUM  PROTIME-INR  APTT  TSH  T4, FREE  BRAIN NATRIURETIC PEPTIDE  TROPONIN I (HIGH SENSITIVITY)   ____________________________________________   ED ECG REPORT I, Vanessa Mil[place], the attending physician, personally viewed and interpreted this ECG.  EKG is sinus rate of 69, ST elevation in aVR, some ST depression in V4 through V6, T wave version aVL.  Does have a left bundle branch block ____________________________________________  RADIOLOGY I, Vanessa [place], personally viewed and evaluated these images (plain radiographs) as part of my medical decision making, as well as reviewing the written report by the radiologist.  ED MD interpretation:  no edema   Official radiology report(s): DG Chest Portable 1 View  Result Date: 10/07/2020 CLINICAL DATA:  Chest pain. EXAM: PORTABLE CHEST 1 VIEW COMPARISON:  Aug 22, 2020 FINDINGS: Mildly radiopaque tags and overlying cardiac lead wires are seen. Mild, chronic appearing diffusely increased lung markings are seen. There is no evidence of focal consolidation, pleural effusion or pneumothorax. The cardiac silhouette is mildly enlarged. Multiple chronic left-sided rib fractures are noted. IMPRESSION: Chronic appearing increased lung markings without acute or active cardiopulmonary disease. Electronically Signed   By: Virgina [place] M.D.   On: 10/07/2020 22:54    ____________________________________________   PROCEDURES  Procedure(s) performed (including Critical Care):  .1-3 Lead EKG Interpretation  Date/Time: 10/07/2020 10:58 PM Performed by: Vanessa [place],  MD Authorized by: Vanessa [place], MD     Interpretation: normal     ECG rate:  60s   ECG rate assessment: normal     Rhythm: sinus rhythm     Ectopy: none     Conduction: normal   .Critical Care  Date/Time: 10/07/2020 11:06 PM Performed by: Vanessa [place], MD Authorized by: Vanessa [place], MD   Critical care provider statement:    Critical care time (minutes):  45   Critical care was time spent personally by me on the following activities:  Discussions with consultants, evaluation of patient's response to treatment, examination of patient, ordering and performing treatments and interventions, ordering and review of laboratory studies, ordering and review of radiographic studies, pulse oximetry, re-evaluation of patient's condition, obtaining history from patient or surrogate and review of old charts   ____________________________________________   INITIAL IMPRESSION / ASSESSMENT AND PLAN / ED COURSE   Douglas Conway was evaluated in Emergency Department on 10/07/2020 for the symptoms described in the history of present illness. He was evaluated in the context of the global COVID-19 pandemic, which necessitated consideration that the patient might be at risk for infection with the SARS-CoV-2 virus that causes COVID-19. Institutional protocols and algorithms that pertain to the evaluation of patients at risk for COVID-19 are in a state of rapid change based on information released by regulatory bodies including the CDC and federal and state organizations. These policies and algorithms were followed during the patient's care in the ED.    Patient is a 66 year old who comes in with tachycardia status post cardioversion.  This concerning for symptoms secondary to arrhythmia.  We will keep on the cardiac monitor.  We will start amnio.  Possibly V. tach versus A. fib with aberrancy.  Will get labs to evaluate for ACS.  Given aVR elevation with STEMI code was called.  Dr. Clayborn Bigness came to evaluate patient  did not feel like it was a STEMI.  Patient says are now all resolved since arrhythmia has resolved.  Labs are ordered to evaluate for thyroid dysfunction, electrolyte abnormalities, troponin  Patient started on heparin.  Patient is adamant that he did not hit his head denies any headaches.    ____________________________________________   FINAL CLINICAL IMPRESSION(S) / ED DIAGNOSES   Final diagnoses:  Wide-complex tachycardia ([place])     MEDICATIONS GIVEN DURING THIS VISIT:  Medications  amiodarone (NEXTERONE PREMIX) 360-4.14 MG/200ML-% (1.8 mg/mL) IV infusion (has no administration in time range)  amiodarone (NEXTERONE PREMIX) 360-4.14 MG/200ML-% (1.8 mg/mL) IV  infusion (has no administration in time range)  heparin injection 4,000 Units (has no administration in time range)     ED Discharge Orders     None        Note:  This document was prepared using Dragon voice recognition software and may include unintentional dictation errors.    Vanessa [place], MD 10/07/20 2302    Vanessa [place], MD 10/07/20 [phone]

## 2020-10-07 NOTE — H&P (Addendum)
NAME:  Douglas Conway, MRN:  [id number], DOB:  [birth year], LOS: 0 ADMISSION DATE:  10/07/2020, CONSULTATION DATE:  10/08/2020 REFERRING MD:  Marjean Donna MD CHIEF COMPLAINT:  Chest pain   History of present illness   65 y.o male with significant history as below presenting to the ED with chief complaints of sudden onset chest pain.  Per EMS report, patient was found sitting on the toilet actively moving his bowels. The pt was  profusely diaphoretic, appeared disoriented but responsive to verbal stimuli. Pt. reported 10/10 chest pain that is worse than his previous heart attack in 2006 associated with nausea and radiating to his left arm. The pt. states that the pain came on suddenly at rest 20 minutes prior. 4-Lead EKG  was obtained and revealed a wide complex tachycardia that was unclear of ventricular tachycardia versus a supraventricular tachycardia 180s with aberrancy. Follow up 12-Lead EKG was obtained and revealed a wide complex tachycardia, with V1 noted to have a downward deflection in V1 and an upward deflection in V6, causing suspicion for SVT with aberrancy. However, due to pt.'s absent radial pulses, profuse diaphoresis, gray color, and change in mental status, the pt. was synchronized cardioverted at 100J with immediate relief of chest pain. Serial EKGs were obtained, with low-lateral and inferior depression noted, but no ST-segment elevation. Patient received normal saline fluid bolus and 116m of Amiodarone.  Pt. Also received 3233mof ASA PO and transported to the ED.  ED: On arrival to the ED, he was afebrile with blood pressure 119/9356mg and pulse rate 63 beats/min. There were no focal neurological deficits; he was alert and oriented x4, and he did not demonstrate any memory deficits.  Initial Labs/Diagnostics in the ED included: WBC/Hgb/Hct/Plts:  4.7/14.0/42.2/195 (06/29 2238)  Glucose 211, BUN/Cr 27/1.29, calcium 8.7 SARS coronavirus 2 by RT PCR Negative Troponin 30 EKG: sinus rate  of 69, ST elevation in aVR, some ST depression in V4 through V6, T wave version aVL.  Does have a left bundle branch block CXR: Chronic appearing increased lung markings without acute or active cardiopulmonary disease. UA: Negative  UDS: Positive for amphetamine  Past Medical History  Ischemic cardiomyopathy  Chronic systolic CHF Coronary artery disease status post STEMI in 2006 with stenting to LAD Hypertension Hyperlipidemia HIV Thyroid disease Lateral epicondylitis  Significant Hospital Events   6/29: Admitted to ICU  Consults:  Cardiology  Procedures:  6/29: S/p cardioversion  Significant Diagnostic Tests:  6/29: Chest Xray>Chronic appearing increased lung markings without acute or active cardiopulmonary disease.  Micro Data:  6/29: SARS-CoV-2 PCR>> negative 6/29: Influenza PCR>> negative  Antimicrobials:  None OBJECTIVE  Blood pressure (!) 119/93, pulse 67, temperature 97.6 F (36.4 C), temperature source Oral, resp. rate 20, height _0  (1.854 m), weight 83.9 kg, SpO2 98 %.       No intake or output data in the 24 hours ending 10/07/20 2341 Filed Weights   10/07/20 2236  Weight: 83.9 kg     Physical Examination  GENERAL: [age]r-old critically ill patient lying in the bed with no acute distress.  EYES: Pupils equal, round, reactive to light and accommodation. No scleral icterus. Extraocular muscles intact.  HEENT: Head atraumatic, normocephalic. Oropharynx and nasopharynx clear.  NECK:  Supple, no jugular venous distention. No thyroid enlargement, no tenderness.  LUNGS: Normal breath sounds bilaterally, no wheezing, rales,rhonchi or crepitation. No use of accessory muscles of respiration.  CARDIOVASCULAR: S1, S2 normal. No murmurs, rubs, or gallops.  ABDOMEN: Soft, nontender, nondistended. Bowel  sounds present. No organomegaly or mass.  EXTREMITIES: No pedal edema, cyanosis, or clubbing.  NEUROLOGIC: Cranial nerves II through XII are intact.  Muscle  strength 5/5 in all extremities. Sensation intact. Gait not checked.  PSYCHIATRIC: The patient is alert and oriented x 3.  SKIN: No obvious rash, lesion, or ulcer.   Labs/imaging that I havepersonally reviewed  (right click and "Reselect all SmartList Selections" daily)     Labs   CBC: Recent Labs  Lab 10/07/20 2238  WBC 4.7  NEUTROABS 2.5  HGB 14.0  HCT 42.2  MCV 92.3  PLT 161    Basic Metabolic Panel: Recent Labs  Lab 10/01/20 1211  NA 141  K 4.3  CL 105  CO2 22  GLUCOSE 92  BUN 26  CREATININE 1.19  CALCIUM 9.4   GFR: Estimated Creatinine Clearance: 72.7 mL/min (by C-G formula based on SCr of 1.19 mg/dL). Recent Labs  Lab 10/07/20 2238  WBC 4.7    Liver Function Tests: No results for input(s): AST, ALT, ALKPHOS, BILITOT, PROT, ALBUMIN in the last 168 hours. No results for input(s): LIPASE, AMYLASE in the last 168 hours. No results for input(s): AMMONIA in the last 168 hours.  ABG    Component Value Date/Time   PHART 7.36 08/19/2019 0017   PCO2ART 46 08/19/2019 0017   PO2ART 64 (L) 08/19/2019 0017   HCO3 26.0 08/19/2019 0017   O2SAT 91.2 08/19/2019 0017     Coagulation Profile: Recent Labs  Lab 10/07/20 2238  INR 1.0    Cardiac Enzymes: No results for input(s): CKTOTAL, CKMB, CKMBINDEX, TROPONINI in the last 168 hours.  HbA1C: No results found for: HGBA1C  CBG: No results for input(s): GLUCAP in the last 168 hours.  Review of Systems:   Review of Systems  Constitutional:  Positive for diaphoresis and malaise/fatigue.  HENT:  Positive for hearing loss. Negative for congestion, ear discharge, ear pain, nosebleeds, sinus pain, sore throat and tinnitus.   Eyes: Negative.   Respiratory:  Positive for shortness of breath. Negative for cough, hemoptysis and stridor.   Cardiovascular:  Positive for chest pain, palpitations and PND. Negative for orthopnea, claudication and leg swelling.  Gastrointestinal:  Positive for blood in stool,  constipation, melena, nausea and vomiting. Negative for abdominal pain, diarrhea and heartburn.  Genitourinary: Negative.   Musculoskeletal:  Positive for back pain.  Skin: Negative.   Neurological:  Positive for dizziness and weakness.  Endo/Heme/Allergies: Negative.   Psychiatric/Behavioral: Negative.     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   Surgical History    Past Surgical History:  Procedure Laterality Date   CORONARY ANGIOPLASTY WITH STENT PLACEMENT       Social History   reports that he has been smoking cigarettes. He started smoking about 52 years ago. He has been smoking an average of 0.10 packs per day. He has never used smokeless tobacco. He reports current alcohol use. He reports that he does not use drugs.   Family History   His family history is not on file.   Allergies Allergies  Allergen Reactions   Plavix [Clopidogrel Bisulfate] Rash     Home Medications  Prior to Admission medications   Medication Sig Start Date  End Date Taking? Authorizing Provider  albuterol (VENTOLIN HFA) 108 (90 Base) MCG/ACT inhaler Inhale 2 puffs into the lungs every 6 (six) hours as needed for wheezing or shortness of breath. 08/22/20   Nance Pear, MD  cabotegravir & rilpivirine ER (CABENUVA) 600 & 900 MG/3ML injection INJECT 1 KIT INTO THE MUSCLE EVERY 8 (EIGHT) WEEKS. 06/15/20 06/15/21  [place] Callas, NP  Cholecalciferol 125 MCG (5000 UT) capsule Take 1 capsule (5,000 Units total) by mouth daily. 06/13/19   Dorothy Spark, MD  levothyroxine (SYNTHROID) 175 MCG tablet TAKE  1 TABLET BY MOUTH ONCE DAILY BEFORE BREAKFAST 03/19/20   Dorothy Spark, MD  metoprolol succinate (TOPROL XL) 25 MG 24 hr tablet Take 0.5 tablets (12.5 mg total) by mouth daily. 09/17/20   Sueanne Margarita, MD  rosuvastatin (CRESTOR) 20 MG tablet Take 1 tablet by mouth once daily 11/15/19   Dorothy Spark, MD  sacubitril-valsartan (ENTRESTO) 24-26 MG Take 1 tablet by mouth 2 (two) times daily. 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  Last Echo 06/2019 showed EF 30-35% with global HK with moderate PHTN -Hypertension -Continuous cardiac monitoring -Maintain MAP greater than 65 - Afterload, Goal MAP <70: Continue Entresto - Continue metoprolol XL 12.5 mg daily - Low salt diet  - Cardiology following, appreciate input - Repeat 2D Echocardiogram as above  HLD  + Goal LDL<100 - Rosuvastatin 20 mg  HIV - Continue cabotegravir & rilpivirine ER (CABENUVA)  - Follows with [facility]  [facility]  Thyroid Dysfunction - Check TSH, Free T4 - Continue  synthroid   Best practice:  Diet:  Oral Pain/Anxiety/Delirium protocol (if indicated): No VAP protocol (if indicated): Not indicated DVT prophylaxis: Systemic AC GI prophylaxis: PPI Glucose control:  SSI No Central venous access:  N/A Arterial line:  N/A Foley:  N/A Mobility:  bed rest  PT consulted: N/A Last date of multidisciplinary goals of care discussion [6/30] Code Status:  full code Disposition: ICU   = Goals of Care =  Primary Emergency Contact: Willeford,Steven(Brother cell: [phone]) Wishes to pursue full aggressive treatment and intervention options, including CPR and intubation Critical care time: 45 minutes     Rufina Falco, DNP, CCRN, FNP-C, AGACNP-BC Acute Care Nurse Practitioner  [facility] Pager: [phone] [place] [facility]  .

## 2020-10-07 NOTE — Telephone Encounter (Signed)
**Note De-Identified Douglas Conway Obfuscation** Letter received Douglas Conway fax from [employer] stating that the pts ins info is missing from his application for [facility] and are requesting that we send them a copy of his ins card. I called the pt but got no answer so I printed his most current Medicaid card from his chart and emailed it to Dr Norris Cross nurse so can fax as is to [employer] at the fax number written on the cover letter included or to place in the "to be faxed box" in Medical Records to be faxed.

## 2020-10-07 NOTE — ED Triage Notes (Signed)
Pt presents via EMS from home for c/o rapid wide complex tachycardia with chest pain. Per report, pt was found sitting on his toilet and stated he had passed out. Pt was diaphoretic and gray in color.  Initially appeared to be in [place] so EMS cardioverted at 100J. Pt converted into a sinus rhythm and chest pain resolved. Pt has hx of MI in 2006. Pt is A&Ox4, denies chest pain, and skin is pink and dry on arrival.   PTA pt was given 150mg  Amiodarone over 10 mins, 324mg  ASA, and NS bolus.

## 2020-10-07 NOTE — H&P (View-Only) (Signed)
Brief cardiology interventional consult note Asked to see patient for code STEMI EKG obtained in the field diffuse ST depression mild ST elevation in aVR Patient reportedly presented with diaphoresis sweating lightheaded dizziness possible syncope and chest pain Known history of coronary disease including PCI and stent, previous MI Known ischemic cardiomyopathy ejection fraction between 30 and 35% Patient had a wide-complex tachycardia rhythm of around 180 cardioversion in the field Patient states after cardioversion he began to improve chest pain resolved diaphoresis fatigue weakness also improved By 10 the patient was in emergency room he felt back to normal EKG had significant improvement he was clinically stable vital signs and symptoms Blood pressure 120 systolic heart rate of 75 respiratory rate of 60 EKGs continue to improve with no ST-T segment changes no chest pain After evaluating examining patient I decided to cancel code STEMI  Impression Wide-complex tachycardia probably ventricular tachycardia Status post cardioversion Ischemic cardiomyopathy EF around 30 to 35% Coronary disease Hyperlipidemia HIV  Plan Recommend admit to telemetry or ICU Follow-up EKGs and troponins Recommend echocardiogram for assessment of left ventricular function wall motion Continue amiodarone loading drip IV heparin 24 to 48 hours Continue Entresto Recommend Coreg or metoprolol Consider cardiac cath prior to discharge Consider AICD placement and EP evaluation We will transfer care back to see [facility] as his primary cardiologist is Dr. Mayford Knife in [place]

## 2020-10-08 ENCOUNTER — Inpatient Hospital Stay: Admit: 2020-10-08 | Payer: Medicaid Other

## 2020-10-08 ENCOUNTER — Encounter: Payer: Self-pay | Admitting: Internal Medicine

## 2020-10-08 ENCOUNTER — Encounter
Admission: EM | Discharge status: Against Medical Advice | Payer: Self-pay | Source: Home / Self Care | Attending: Emergency Medicine

## 2020-10-08 ENCOUNTER — Inpatient Hospital Stay (HOSPITAL_COMMUNITY)
Admit: 2020-10-08 | Discharge: 2020-10-08 | Disposition: A | Discharge status: Home / Self Care | Payer: Medicaid Other | Attending: Nurse Practitioner | Admitting: Nurse Practitioner

## 2020-10-08 DIAGNOSIS — I25118 Atherosclerotic heart disease of native coronary artery with other forms of angina pectoris: Secondary | ICD-10-CM

## 2020-10-08 DIAGNOSIS — I214 Non-ST elevation (NSTEMI) myocardial infarction: Secondary | ICD-10-CM

## 2020-10-08 DIAGNOSIS — I200 Unstable angina: Secondary | ICD-10-CM

## 2020-10-08 DIAGNOSIS — I255 Ischemic cardiomyopathy: Secondary | ICD-10-CM | POA: Diagnosis not present

## 2020-10-08 DIAGNOSIS — I472 Ventricular tachycardia: Secondary | ICD-10-CM | POA: Diagnosis not present

## 2020-10-08 DIAGNOSIS — R55 Syncope and collapse: Secondary | ICD-10-CM

## 2020-10-08 DIAGNOSIS — I5023 Acute on chronic systolic (congestive) heart failure: Secondary | ICD-10-CM

## 2020-10-08 DIAGNOSIS — I25110 Atherosclerotic heart disease of native coronary artery with unstable angina pectoris: Secondary | ICD-10-CM | POA: Diagnosis not present

## 2020-10-08 DIAGNOSIS — J810 Acute pulmonary edema: Secondary | ICD-10-CM

## 2020-10-08 DIAGNOSIS — R079 Chest pain, unspecified: Secondary | ICD-10-CM

## 2020-10-08 HISTORY — PX: LEFT HEART CATH AND CORONARY ANGIOGRAPHY: CATH118249

## 2020-10-08 LAB — URINALYSIS, COMPLETE (UACMP) WITH MICROSCOPIC
Bacteria, UA: NONE SEEN
Bilirubin Urine: NEGATIVE
Glucose, UA: 50 mg/dL — AB
Ketones, ur: NEGATIVE mg/dL
Leukocytes,Ua: NEGATIVE
Nitrite: NEGATIVE
Protein, ur: NEGATIVE mg/dL
Specific Gravity, Urine: 1.006 (ref 1.005–1.030)
Squamous Epithelial / HPF: NONE SEEN (ref 0–5)
pH: 8 (ref 5.0–8.0)

## 2020-10-08 LAB — ECHOCARDIOGRAM COMPLETE
AR max vel: 1.77 cm2
AV Area VTI: 2.12 cm2
AV Area mean vel: 1.74 cm2
AV Mean grad: 1 mmHg
AV Peak grad: 2 mmHg
Ao pk vel: 0.7 m/s
Area-P 1/2: 7.99 cm2
Height: 73 in
S' Lateral: 6.3 cm
Single Plane A4C EF: 22.4 %
Weight: 3044.11 oz

## 2020-10-08 LAB — URINE DRUG SCREEN, QUALITATIVE (ARMC ONLY)
Amphetamines, Ur Screen: POSITIVE — AB
Barbiturates, Ur Screen: NOT DETECTED
Benzodiazepine, Ur Scrn: NOT DETECTED
Cannabinoid 50 Ng, Ur ~~LOC~~: NOT DETECTED
Cocaine Metabolite,Ur ~~LOC~~: NOT DETECTED
MDMA (Ecstasy)Ur Screen: NOT DETECTED
Methadone Scn, Ur: NOT DETECTED
Opiate, Ur Screen: NOT DETECTED
Phencyclidine (PCP) Ur S: NOT DETECTED
Tricyclic, Ur Screen: NOT DETECTED

## 2020-10-08 LAB — TROPONIN I (HIGH SENSITIVITY): Troponin I (High Sensitivity): 441 ng/L (ref <18)

## 2020-10-08 LAB — RESP PANEL BY RT-PCR (FLU A&B, COVID) ARPGX2
Influenza A by PCR: NEGATIVE
Influenza B by PCR: NEGATIVE
SARS Coronavirus 2 by RT PCR: NEGATIVE

## 2020-10-08 LAB — BRAIN NATRIURETIC PEPTIDE: B Natriuretic Peptide: 595.2 pg/mL — ABNORMAL HIGH (ref 0.0–100.0)

## 2020-10-08 LAB — CBC
HCT: 43 % (ref 39.0–52.0)
Hemoglobin: 14.7 g/dL (ref 13.0–17.0)
MCH: 31.3 pg (ref 26.0–34.0)
MCHC: 34.2 g/dL (ref 30.0–36.0)
MCV: 91.7 fL (ref 80.0–100.0)
Platelets: 201 10*3/uL (ref 150–400)
RBC: 4.69 MIL/uL (ref 4.22–5.81)
RDW: 15.1 % (ref 11.5–15.5)
WBC: 8.3 10*3/uL (ref 4.0–10.5)
nRBC: 0 % (ref 0.0–0.2)

## 2020-10-08 LAB — BASIC METABOLIC PANEL
Anion gap: 9 (ref 5–15)
BUN: 23 mg/dL (ref 8–23)
CO2: 24 mmol/L (ref 22–32)
Calcium: 8.7 mg/dL — ABNORMAL LOW (ref 8.9–10.3)
Chloride: 105 mmol/L (ref 98–111)
Creatinine, Ser: 1.05 mg/dL (ref 0.61–1.24)
GFR, Estimated: >60 mL/min (ref >60)
Glucose, Bld: 192 mg/dL — ABNORMAL HIGH (ref 70–99)
Potassium: 4.3 mmol/L (ref 3.5–5.1)
Sodium: 138 mmol/L (ref 135–145)

## 2020-10-08 LAB — PHOSPHORUS: Phosphorus: 4.2 mg/dL (ref 2.5–4.6)

## 2020-10-08 LAB — HEPARIN LEVEL (UNFRACTIONATED): Heparin Unfractionated: 0.37 IU/mL (ref 0.30–0.70)

## 2020-10-08 LAB — MAGNESIUM: Magnesium: 2.1 mg/dL (ref 1.7–2.4)

## 2020-10-08 LAB — GLUCOSE, CAPILLARY: Glucose-Capillary: 97 mg/dL (ref 70–99)

## 2020-10-08 LAB — MRSA NEXT GEN BY PCR, NASAL: MRSA by PCR Next Gen: DETECTED — AB

## 2020-10-08 SURGERY — LEFT HEART CATH AND CORONARY ANGIOGRAPHY
Anesthesia: Moderate Sedation

## 2020-10-08 SURGERY — CORONARY/GRAFT ACUTE MI REVASCULARIZATION
Anesthesia: Moderate Sedation

## 2020-10-08 MED ORDER — VERAPAMIL HCL 2.5 MG/ML IV SOLN
INTRAVENOUS | Status: DC | PRN
  Administered 2020-10-08: 2.5 mg via INTRAVENOUS

## 2020-10-08 MED ORDER — SODIUM CHLORIDE 0.9% FLUSH: 3.0000 mL | INTRAVENOUS | Status: DC | PRN

## 2020-10-08 MED ORDER — SODIUM CHLORIDE 0.9 % IV SOLN: INTRAVENOUS | Status: DC

## 2020-10-08 MED ORDER — ONDANSETRON HCL 4 MG/2ML IJ SOLN: 4.0000 mg | Freq: Four times a day (QID) | INTRAMUSCULAR | Status: DC | PRN

## 2020-10-08 MED ORDER — HEPARIN SODIUM (PORCINE) 1000 UNIT/ML IJ SOLN
INTRAMUSCULAR | Status: DC | PRN
Start: 2020-10-08 — End: 2020-10-08
  Administered 2020-10-08: 4500 [IU] via INTRAVENOUS

## 2020-10-08 MED ORDER — HEPARIN SODIUM (PORCINE) 1000 UNIT/ML IJ SOLN
INTRAMUSCULAR | Status: AC
  Filled 2020-10-08: qty 1

## 2020-10-08 MED ORDER — SODIUM CHLORIDE 0.9% FLUSH
3.0000 mL | Freq: Two times a day (BID) | INTRAVENOUS | Status: DC
  Administered 2020-10-08: 3 mL via INTRAVENOUS

## 2020-10-08 MED ORDER — FENTANYL CITRATE (PF) 100 MCG/2ML IJ SOLN
INTRAMUSCULAR | Status: DC | PRN
  Administered 2020-10-08: 50 ug via INTRAVENOUS

## 2020-10-08 MED ORDER — MIDAZOLAM HCL 2 MG/2ML IJ SOLN
INTRAMUSCULAR | Status: DC | PRN
  Administered 2020-10-08: 1 mg via INTRAVENOUS

## 2020-10-08 MED ORDER — SACUBITRIL-VALSARTAN 24-26 MG PO TABS
1.0000 | ORAL_TABLET | Freq: Two times a day (BID) | ORAL | Status: DC
  Administered 2020-10-08: 1 via ORAL
  Filled 2020-10-08 (×2): qty 1

## 2020-10-08 MED ORDER — LIDOCAINE HCL (PF) 1 % IJ SOLN
INTRAMUSCULAR | Status: AC
  Filled 2020-10-08: qty 30

## 2020-10-08 MED ORDER — MIDAZOLAM HCL 2 MG/2ML IJ SOLN
INTRAMUSCULAR | Status: AC
  Filled 2020-10-08: qty 2

## 2020-10-08 MED ORDER — ROSUVASTATIN CALCIUM 10 MG PO TABS
20.0000 mg | ORAL_TABLET | Freq: Every day | ORAL | Status: DC
  Administered 2020-10-08: 20 mg via ORAL
  Filled 2020-10-08: qty 2

## 2020-10-08 MED ORDER — LIDOCAINE HCL (PF) 1 % IJ SOLN
INTRAMUSCULAR | Status: DC | PRN
  Administered 2020-10-08: 5 mL

## 2020-10-08 MED ORDER — HEPARIN (PORCINE) IN NACL 1000-0.9 UT/500ML-% IV SOLN
INTRAVENOUS | Status: AC
  Filled 2020-10-08: qty 1000

## 2020-10-08 MED ORDER — SODIUM CHLORIDE 0.9 % IV SOLN: 250.0000 mL | INTRAVENOUS | Status: DC | PRN

## 2020-10-08 MED ORDER — IOHEXOL 300 MG/ML  SOLN
INTRAMUSCULAR | Status: DC | PRN
  Administered 2020-10-08: 37 mL

## 2020-10-08 MED ORDER — ACETAMINOPHEN 325 MG PO TABS: 650.0000 mg | ORAL_TABLET | ORAL | Status: DC | PRN

## 2020-10-08 MED ORDER — MUPIROCIN 2 % EX OINT
1.0000 "application " | TOPICAL_OINTMENT | Freq: Two times a day (BID) | CUTANEOUS | Status: DC
  Filled 2020-10-08: qty 22

## 2020-10-08 MED ORDER — FENTANYL CITRATE (PF) 100 MCG/2ML IJ SOLN
INTRAMUSCULAR | Status: AC
  Filled 2020-10-08: qty 2

## 2020-10-08 MED ORDER — LEVOTHYROXINE SODIUM 50 MCG PO TABS
175.0000 ug | ORAL_TABLET | Freq: Every day | ORAL | Status: DC
  Filled 2020-10-08: qty 2

## 2020-10-08 MED ORDER — HEPARIN (PORCINE) IN NACL 2000-0.9 UNIT/L-% IV SOLN
INTRAVENOUS | Status: DC | PRN
  Administered 2020-10-08: 1000 mL

## 2020-10-08 MED ORDER — VERAPAMIL HCL 2.5 MG/ML IV SOLN
INTRAVENOUS | Status: AC
  Filled 2020-10-08: qty 2

## 2020-10-08 MED ORDER — METOPROLOL SUCCINATE ER 25 MG PO TB24
12.5000 mg | ORAL_TABLET | Freq: Every day | ORAL | Status: DC
  Filled 2020-10-08: qty 0.5

## 2020-10-08 MED ORDER — CHLORHEXIDINE GLUCONATE CLOTH 2 % EX PADS
6.0000 | MEDICATED_PAD | Freq: Every day | CUTANEOUS | Status: DC
  Administered 2020-10-08: 6 via TOPICAL

## 2020-10-08 MED ORDER — ASPIRIN 81 MG PO CHEW: 81.0000 mg | CHEWABLE_TABLET | ORAL | Status: DC

## 2020-10-08 SURGICAL SUPPLY — 13 items
CATH BALLN WEDGE 5F 110CM (CATHETERS) ×2 IMPLANT
CATH INFINITI 5FR JK (CATHETERS) ×2 IMPLANT
DEVICE RAD TR BAND REGULAR (VASCULAR PRODUCTS) ×2 IMPLANT
DRAPE BRACHIAL (DRAPES) ×4 IMPLANT
GLIDESHEATH SLEND SS 6F .021 (SHEATH) ×2 IMPLANT
GUIDEWIRE INQWIRE 1.5J.035X260 (WIRE) ×1 IMPLANT
INQWIRE 1.5J .035X260CM (WIRE) ×2
OXISENSOR HEAD OXIMETRY MAXFAS (MISCELLANEOUS) ×2 IMPLANT
PACK CARDIAC CATH (CUSTOM PROCEDURE TRAY) ×2 IMPLANT
PROTECTION STATION PRESSURIZED (MISCELLANEOUS) ×2
SET ATX SIMPLICITY (MISCELLANEOUS) ×2 IMPLANT
SHEATH GLIDE SLENDER 4/5FR (SHEATH) ×2 IMPLANT
STATION PROTECTION PRESSURIZED (MISCELLANEOUS) ×1 IMPLANT

## 2020-10-08 NOTE — Progress Notes (Signed)
*  PRELIMINARY RESULTS* Echocardiogram 2D Echocardiogram has been performed.  Cristela Blue 10/08/2020, 11:13 AM

## 2020-10-08 NOTE — ED Notes (Signed)
Verified that lab has covid swab and will process.

## 2020-10-08 NOTE — Progress Notes (Signed)
ANTICOAGULATION CONSULT NOTE  Pharmacy Consult for heparin infusion Indication: ACS/STEMI  Allergies  Allergen Reactions   Plavix [Clopidogrel Bisulfate] Rash    Patient Measurements: Height: 6\' 1"  (185.4 cm) Weight: 86.3 kg (190 lb 4.1 oz) IBW/kg (Calculated) : 79.9 Heparin Dosing Weight: 83.9 kg  Vital Signs: Temp: 98.1 F (36.7 C) (06/30 0800) Temp Source: Oral (06/30 0800) BP: 119/85 (06/30 0700) Pulse Rate: 68 (06/30 0700)  Labs: Recent Labs    10/07/20 2238 10/08/20 0251 10/08/20 0610  HGB 14.0  --  14.7  HCT 42.2  --  43.0  PLT 195  --  201  APTT 22*  --   --   LABPROT 13.6  --   --   INR 1.0  --   --   HEPARINUNFRC  --   --  0.37  CREATININE 1.29*  --  1.05  TROPONINIHS 30* 441*  --      Estimated Creatinine Clearance: 82.4 mL/min (by C-G formula based on SCr of 1.05 mg/dL).   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  Pt initially appeared to be in [place] so EMS cardioverted. Pt converted into a sinus rhythm and chest pain resolved. Pt has hx of MI in 2006.  Goal of Therapy:  Heparin level 0.3-0.7 units/ml Monitor platelets by anticoagulation protocol: Yes   Plan:  Heparin level therapeutic Continue heparin drip at 1100 units/hr Recheck HL at 1200 to confirm CBC daily while on heparin drip  2007, PharmD 10/08/2020 8:51 AM

## 2020-10-08 NOTE — Progress Notes (Signed)
Patient left AMA at 2005 due to security issues at home. Risks of leaving AMA explained and patient verbalized understanding, but still insisted on leaving. AMA form signed by patient and in chart.

## 2020-10-08 NOTE — Progress Notes (Signed)
Pt admitted from the ED this AM for chest pain and pre-syncope - on Amiodarone and heparin drip. Sent to Cath lab for PCI - no intervention done. Received back on the unit at 1500. R radial TR band deflated and removed per protocol. Transparent dressing applied on site at 1730, no hematoma or bleeding noted. VS remain stable, witth notable frequent multifocal PVCs and short episodes of bradycardia down to 47 bpm lasting for 2-3 secs. Amiodarone drip continued for now. At 1800 pt verbalized desire to go home tonight due to security issues at home. Educated on risks and benefits and the importance of being monitored post procedure. Charge RN and Dr Belia Heman informed, pt agreed to stay until 2000. Verbalized understanding of above discussion, AMA paper signed and secured in  his chart.

## 2020-10-08 NOTE — Consult Note (Signed)
Cardiology Consult    Patient ID: Douglas Conway MRN: [id number], DOB/AGE: November 15, 1955   Admit date: 10/07/2020 Date of Consult: 10/08/2020  Primary Physician: Oneita Hurt, No Primary Cardiologist: Armanda Magic, MD Requesting Provider: Army Chaco, MD  Patient Profile    Douglas Conway is a 65 y.o. male with a history of CAD, ICM, HFrEF, HIV, hypothyroidism, HTN, HL, tobacco abuse, and noncompliance, who is being seen today for the evaluation of wide complex tachycardia and NSTEMI at the request of Dr. Belia Heman.  Past Medical History   Past Medical History:  Diagnosis Date   Change in hearing, bilateral 06/28/2018   Chronic systolic congestive heart failure (HCC) 05/14/2019   a. 06/2019 Echo: EF 30-35%, glob HK. gr1 DD. Mod elev RVSP. Midly dil LA, Triv MR/AI. Asc Ao 48mm.   Coronary artery disease    a. 03/2005 Ant STEMI s/p PCI/DES to the LAD; b. 05/2010 St echo: reportedly nl.   HIV disease (HCC) 06/28/2018   Hyperlipidemia LDL goal <70    Hypertension 06/28/2018   Hypothyroidism 01/07/2019   Lateral epicondylitis 01/07/2019   Noncompliance    STEMI (ST elevation myocardial infarction) (HCC)    Tobacco abuse     Past Surgical History:  Procedure Laterality Date   CORONARY ANGIOPLASTY WITH STENT PLACEMENT       Allergies  Allergies  Allergen Reactions   Plavix [Clopidogrel Bisulfate] Rash    History of Present Illness   65 y/o ? w/ the above complex PMH including CAD, ICM, HFrEF, HTN, HL, HIV, hypothyroidism, tob abuse, and noncompliance.  Cardiac history dates back to 2006, when he suffered an anterior STEMI and underwent PCI/DES to the LAD.  He was found to have depressed LV fxn (25%) and was initiated on med rx.  Notes indicate that EF remained depressed and pt says that he was advised that he needed and ICD, but he declined.  He was apparently lost to follow-up but reestablished care with our [place] office in 2021.  Echo in March 2021 showed an EF of 30-35% w/ global HK.  He was rx entresto,  however did not start due to lack of insurance.    He was last seen in cardiology in clinic on 09/17/2020.  Toprol XL and entresto were added w/ plan for early f/u for med titration and plan for repeat echo at a later date on maximally tolerated GDMT.  Patient notes that he continues to smoke about a third of a pack of cigarettes every day.  He also uses methamphetamines/crystal meth 2-3 times per week.  He last used crystal meth on June 29.  He was in his [place] until 6/29, when he just entered his shop and developed sudden onset of dyspnea, which was followed by diaphoresis, and lightheadedness.  He dropped to one knee, and then believes that he lost consciousness, as he doesn't remember falling or lying down, but found himself on the ground.  When he regained consciousness, he noted onset of chest pain.  He went back into his house, where chest pain, dyspnea, diaphoresis, and lightheadedness persisted/worsened.  He called EMS and while waiting, felt the urge to have a bowel mvmt.  He went into his bathroom to use the toilet.  Upon EMS arrival, he was still on the toilet.  He was found to be in a WCT @ 180-190 bpm.  EMTs were not able to palpate a radial pulse and decision was made to undertake direct-current cardioversion at 100 J, which was successful in restoring sinus  rhythm.  Patient was then treated with amiodarone 150 mg and transported to the [facility] ED.  Here, initial ECG showed sinus rhythm at 72 bpm with first-degree AV block, left axis deviation, and left bundle branch block.  Initially, a code STEMI was called however following review of rhythm strips and patient case, this was called off.  Labs notable for initial troponin of 30 that rose to 441.  Creatinine was mildly elevated above baseline at 1.29.  TSH was markedly elevated at 65.394.  Patient was placed on heparin and continued on amiodarone infusion and admitted to ICU.  This morning, he has no complaints.  Inpatient Medications      Chlorhexidine Gluconate Cloth  6 each Topical Q0600   levothyroxine  175 mcg Oral Q0600   metoprolol succinate  12.5 mg Oral Daily   mupirocin ointment  1 application Nasal BID   rosuvastatin  20 mg Oral Daily   sacubitril-valsartan  1 tablet Oral BID   sodium chloride flush  3 mL Intravenous Q12H    Family History    History reviewed. No pertinent family history. He indicated that his mother is deceased. He indicated that his father is deceased.  Social History    Social History   Socioeconomic History   Marital status: Divorced    Spouse name: Not on file   Number of children: Not on file   Years of education: Not on file   Highest education level: Not on file  Occupational History   Not on file  Tobacco Use   Smoking status: Every Day    Packs/day: 1.00    Years: 42.00    Pack years: 42.00    Types: Cigarettes    Start date: 01/07/1968   Smokeless tobacco: Never   Tobacco comments:    Smoked 1 ppd for most of his adult life but now smoking ~ 1/3 ppd.  Vaping Use   Vaping Use: Never used  Substance and Sexual Activity   Alcohol use: Yes    Alcohol/week: 7.0 standard drinks    Types: 7 Cans of beer per week    Comment: 1 can/beer/daily   Drug use: Yes    Types: Amphetamines    Comment: smokes or snorts crystal meth 2-3x/wk.   Sexual activity: Not Currently    Partners: Female    Birth control/protection: Condom    Comment: given condoms 09/14/20  Other Topics Concern   Not on file  Social History Narrative   Lives in [place] by himself.  Works as a Psychologist, occupational.  Does not routinely exercise.   Social Determinants of Health   Financial Resource Strain: Not on file  Food Insecurity: Not on file  Transportation Needs: Not on file  Physical Activity: Not on file  Stress: Not on file  Social Connections: Not on file  Intimate Partner Violence: Not on file     Review of Systems    General:  No chills, fever, night sweats or weight changes.  Cardiovascular:  No  chest pain, dyspnea on exertion, edema, orthopnea, palpitations, paroxysmal nocturnal dyspnea. Dermatological: No rash, lesions/masses Respiratory: No cough, dyspnea Urologic: No hematuria, dysuria Abdominal:   No nausea, vomiting, diarrhea, bright red blood per rectum, melena, or hematemesis Neurologic:  No visual changes, wkns, changes in mental status. All other systems reviewed and are otherwise negative except as noted above.  Physical Exam    Blood pressure (!) 127/107, pulse 77, temperature 98.1 F (36.7 C), temperature source Oral, resp. rate 20, height 6'  1" (1.854 m), weight 86.3 kg, SpO2 100 %.  General: Pleasant, NAD Psych: Normal affect. Neuro: Alert and oriented X 3. Moves all extremities spontaneously. HEENT: Normal  Neck: Supple without bruits or JVD. Lungs:  Resp regular and unlabored, diminished breath sounds bilaterally. Heart: RRR no s3, s4, or murmurs. Abdomen: Soft, non-tender, non-distended, BS + x 4.  Extremities: No clubbing, cyanosis or edema. DP/PT2+, Radials 2+ and equal bilaterally.  Labs    Cardiac Enzymes Recent Labs  Lab 10/07/20 2238 10/08/20 0251  TROPONINIHS 30* 441*      Lab Results  Component Value Date   WBC 8.3 10/08/2020   HGB 14.7 10/08/2020   HCT 43.0 10/08/2020   MCV 91.7 10/08/2020   PLT 201 10/08/2020    Recent Labs  Lab 10/08/20 0610  NA 138  K 4.3  CL 105  CO2 24  BUN 23  CREATININE 1.05  CALCIUM 8.7*  GLUCOSE 192*   Lab Results  Component Value Date   CHOL 106 03/18/2020   HDL 42 03/18/2020   LDLCALC 43 03/18/2020   TRIG 130 03/18/2020   Lab Results  Component Value Date   DDIMER 1.11 (H) 08/22/2020     Radiology Studies    DG Chest Portable 1 View  Result Date: 10/07/2020 CLINICAL DATA:  Chest pain. EXAM: PORTABLE CHEST 1 VIEW COMPARISON:  Aug 22, 2020 FINDINGS: Mildly radiopaque tags and overlying cardiac lead wires are seen. Mild, chronic appearing diffusely increased lung markings are seen. There  is no evidence of focal consolidation, pleural effusion or pneumothorax. The cardiac silhouette is mildly enlarged. Multiple chronic left-sided rib fractures are noted. IMPRESSION: Chronic appearing increased lung markings without acute or active cardiopulmonary disease. Electronically Signed   By: Aram Candela M.D.   On: 10/07/2020 22:54   XR Knee 1-2 Views Right  Result Date: 09/17/2020 2 view radiographs of the right knee show some swelling over the prepatellar bursa.  There is no joint effusion no destructive bony changes the joint spaces are congruent.   ECG & Cardiac Imaging    Regular sinus rhythm, 72, first-degree AV block, left axis deviation, left bundle branch block- personally reviewed.  Assessment & Plan    1.  Wide-complex tachycardia: Patient with sudden onset of dyspnea, diaphoresis, lightheadedness, and chest pain in the setting of wide-complex tachycardia presumed to be ventricular tachycardia status post cardioversion in the field.  For the most part, he has been maintaining sinus rhythm on telemetry with a 3 beat run of nonsustained VT overnight.  Remains on amiodarone infusion at this time.  EMS strips are not available for review.  We did discuss his case with Dr. Juliann Pares, who was on STEMI call last night and did see the rhythm strips, which he presumed to be VT until proven otherwise.  In light of prior history of CAD, ischemic cardiomyopathy, elevated troponin, and concern for ischemic VT, we will arrange for diagnostic catheterization today.  Given prior history of ischemic cardiomyopathy, he will need electrophysiology eval and likely ICD in the future.  Further recommendations pending catheterization.  2.  Non-STEMI/CAD: Status post prior anterior MI in 2006 with stenting of the LAD.  It appears that he had a stress echo in 2012 which was apparently normal.  He does not typically experience chest pain but in the setting of above, chest pain and dyspnea on June 29.  As  noted above, ECG was felt to be consistent with wide-complex tachycardia concerning for VT.  Troponins elevated from  30  441 with repeat pending this morning.  He remains on heparin.  I will resume home doses of aspirin, beta-blocker, and Entresto.  Plan on diagnostic catheterization today.  The patient understands that risks include but are not limited to stroke (1 in 1000), death (1 in 1000), kidney failure [usually temporary] (1 in 500), bleeding (1 in 200), allergic reaction [possibly serious] (1 in 200), and agrees to proceed.    3.  Ischemic cardiomyopathy/HFrEF: This diagnosis dates back to 2006.  Patient was apparently told at one point that he would require ICD placement but this was deferred, likely in the setting of noncompliance.  He only recently started taking metoprolol and Entresto and notes compliance since early June.  He is euvolemic on examination.  Resume home meds.  We will look to add spironolactone and SGLT2 inhibitor.  Pending catheterization, may need ICD therapy sooner rather than later.  4.  Drug abuse: Patient uses crystal meth 2-3 times per week and used on the morning of admission.  He has never had this kind of side effect related to using methamphetamines in the past.  Complete cessation advised.  5.  Tobacco abuse: Smoked a pack a day most of his adult life but cut back over the past 6 months to just one third of a pack a day.  Complete cessation advised.  6.  Hypothyroidism: Noncompliant with levothyroxine.  TSH 65.394 this admission.  Levothyroxine resumed.  Stressed the importance of compliance.  7.  Essential hypertension: Stable.  Resuming beta-blocker and Entresto.  8.  Hyperlipidemia: LDL of 43 in December 2021.  Continue rosuvastatin therapy.  Signed, Nicolasa Ducking, NP 10/08/2020, 9:56 AM  For questions or updates, please contact   Please consult [email or web address] for contact info under Cardiology/STEMI.

## 2020-10-08 NOTE — ED Notes (Signed)
Pt given warm blanket, denies chest pain

## 2020-10-08 NOTE — Discharge Summary (Signed)
Physician Discharge Summary  Patient ID: Damauri Minion MRN: [id number] DOB/AGE: Oct 11, 1955 65 y.o.  Admit date: 10/07/2020 Discharge date: 10/08/2020  Admission Diagnoses:ACUTE sCHF  Discharge Diagnoses:  Active Problems:   Ventricular tachyarrhythmia (HCC)   Unstable angina (HCC)   Acute on chronic systolic heart failure [facility]   Discharged Condition: unstable  Hospital Course:  66 y.o male with significant history as below presenting to the ED with chief complaints of sudden onset chest pain.   Per EMS report, patient was found sitting on the toilet actively moving his bowels. The pt was  profusely diaphoretic, appeared disoriented but responsive to verbal stimuli. Pt. reported 10/10 chest pain that is worse than his previous heart attack in 2006 associated with nausea and radiating to his left arm. The pt. states that the pain came on suddenly at rest 20 minutes prior. 4-Lead EKG  was obtained and revealed a wide complex tachycardia that was unclear of ventricular tachycardia versus a supraventricular tachycardia 180s with aberrancy. Follow up 12-Lead EKG was obtained and revealed a wide complex tachycardia, with V1 noted to have a downward deflection in V1 and an upward deflection in V6, causing suspicion for SVT with aberrancy. However, due to pt.'s absent radial pulses, profuse diaphoresis, gray color, and change in mental status, the pt. was synchronized cardioverted at 100J with immediate relief of chest pain. Serial EKGs were obtained, with low-lateral and inferior depression noted, but no ST-segment elevation. Patient received normal saline fluid bolus and 150mg  of Amiodarone.  Pt. Also received 324mg  of ASA PO and transported to the ED.   ED: On arrival to the ED, he was afebrile with blood pressure 119/57mm Hg and pulse rate 63 beats/min. There were no focal neurological deficits; he was alert and oriented x4, and he did not demonstrate any memory deficits.  Initial Labs/Diagnostics in  the ED included: WBC/Hgb/Hct/Plts:  4.7/14.0/42.2/195 (06/29 2238)  Glucose 211, BUN/Cr 27/1.29, calcium 8.7 SARS coronavirus 2 by RT PCR Negative Troponin 30 EKG: sinus rate of 69, ST elevation in aVR, some ST depression in V4 through V6, T wave version aVL.  Does have a left bundle branch block CXR: Chronic appearing increased lung markings without acute or active cardiopulmonary disease. UA: Negative  UDS: Positive for amphetamine Ischemic cardiomyopathy Chronic systolic CHF Coronary artery disease status post STEMI in 2006 with stenting to LAD Hypertension Hyperlipidemia HIV Thyroid disease Lateral epicondylitis   Significant Hospital Events   6/29: Admitted to ICU   Consults:  Cardiology   Procedures:  6/29: S/p cardioversion   Significant Diagnostic Tests:  6/29: Chest Xray>Chronic appearing increased lung markings without acute or active cardiopulmonary disease.   Micro Data:  6/29: SARS-CoV-2 PCR>> negative 6/29: Influenza PCR>> negative   Antimicrobials:  None OBJECTIVE  Blood pressure (!) 119/93, pulse 67, temperature 97.6 F (36.4 C), temperature source Oral, resp. rate 20, height 6\' 1"  (1.854 m), weight 83.9 kg, SpO2 98 %.      Discharge Exam: Blood pressure 106/78, pulse 69, temperature 98 F (36.7 C), temperature source Oral, resp. rate 20, height 6\' 1"  (1.854 m), weight 86.3 kg, SpO2 90 %.    CARDIOLOGY ASSESSMENT Recommend admit to telemetry or ICU Follow-up EKGs and troponins Recommend echocardiogram for assessment of left ventricular function wall motion Continue amiodarone loading drip IV heparin 24 to 48 hours Continue Entresto Recommend Coreg or metoprolol Consider cardiac cath prior to discharge Consider AICD placement and EP evaluation We will transfer care back to see [facility] as his primary cardiologist  is Dr. Mayford Knife in [place]                  Disposition: unstable   AFTER FURTHER DISCUSSION WITH PATIENT, WILL LEAVE  AGAINST MEDICAL ADVICE AFTER 2 HRS, SINCE HEPARIN WAS STOPPED    The Risks and Benefits of staying in ICU explained to patient.  I have discussed the risk for Acute Bleeding, increased chance of Infection, increased chance of Respiratory Failure and Cardiac Arrest, increased chance of pneumothorax and collapsed lung, as well as increased Stroke and Death.  The patient understand the risks and wants to leave AMA.     Allergies as of 10/08/2020       Reactions   Plavix [clopidogrel Bisulfate] Rash        Medication List     STOP taking these medications    albuterol 108 (90 Base) MCG/ACT inhaler Commonly known as: VENTOLIN HFA   Cabenuva 600 & 900 MG/3ML injection Generic drug: cabotegravir & rilpivirine ER   Cholecalciferol 125 MCG (5000 UT) capsule   Entresto 24-26 MG Generic drug: sacubitril-valsartan   levothyroxine 175 MCG tablet Commonly known as: SYNTHROID   metoprolol succinate 25 MG 24 hr tablet Commonly known as: Toprol XL   rosuvastatin 20 MG tablet Commonly known as: CRESTOR         Signed: Rayyan Burley 10/08/2020, 5:54 PM

## 2020-10-08 NOTE — Interval H&P Note (Signed)
Cath Lab Visit (complete for each Cath Lab visit)  Clinical Evaluation Leading to the Procedure:   ACS: Yes.    Non-ACS:  n/a   History and Physical Interval Note:  10/08/2020 12:48 PM  Douglas Conway  has presented today for surgery, with the diagnosis of nstemi/wide complex tachycardia.  The various methods of treatment have been discussed with the patient and family. After consideration of risks, benefits and other options for treatment, the patient has consented to  Procedure(s): LEFT HEART CATH AND CORONARY ANGIOGRAPHY (N/A) as a surgical intervention.  The patient's history has been reviewed, patient examined, no change in status, stable for surgery.  I have reviewed the patient's chart and labs.  Questions were answered to the patient's satisfaction.     Lorine Bears

## 2020-10-08 NOTE — Progress Notes (Signed)
    AFTER FURTHER DISCUSSION WITH PATIENT, WILL LEAVE AGAINST MEDICAL ADVICE AFTER 2 HRS, SINCE HEPARIN WAS STOPPED   The Risks and Benefits of staying in ICU explained to patient.  I have discussed the risk for Acute Bleeding, increased chance of Infection, increased chance of Respiratory Failure and Cardiac Arrest, increased chance of pneumothorax and collapsed lung, as well as increased Stroke and Death.  The patient understand the risks and wants to leave AMA.

## 2020-10-08 NOTE — ED Notes (Signed)
Pt had 3 beat run of vtach

## 2020-10-09 ENCOUNTER — Telehealth: Payer: Self-pay | Admitting: Cardiovascular Disease

## 2020-10-09 NOTE — Telephone Encounter (Signed)
Called over to EMS and left message.

## 2020-10-13 ENCOUNTER — Emergency Department: Payer: Medicaid Other

## 2020-10-13 ENCOUNTER — Encounter: Payer: Self-pay | Admitting: Emergency Medicine

## 2020-10-13 ENCOUNTER — Inpatient Hospital Stay
Admission: EM | Admit: 2020-10-13 | Discharge: 2020-10-17 | DRG: 309 | Disposition: A | Discharge status: Home / Self Care | Payer: Medicaid Other | Attending: Internal Medicine | Admitting: Internal Medicine

## 2020-10-13 DIAGNOSIS — R0683 Snoring: Secondary | ICD-10-CM | POA: Diagnosis present

## 2020-10-13 DIAGNOSIS — F1510 Other stimulant abuse, uncomplicated: Secondary | ICD-10-CM | POA: Diagnosis present

## 2020-10-13 DIAGNOSIS — E039 Hypothyroidism, unspecified: Secondary | ICD-10-CM

## 2020-10-13 DIAGNOSIS — I5022 Chronic systolic (congestive) heart failure: Secondary | ICD-10-CM

## 2020-10-13 DIAGNOSIS — F17210 Nicotine dependence, cigarettes, uncomplicated: Secondary | ICD-10-CM | POA: Diagnosis present

## 2020-10-13 DIAGNOSIS — E559 Vitamin D deficiency, unspecified: Secondary | ICD-10-CM | POA: Diagnosis present

## 2020-10-13 DIAGNOSIS — N179 Acute kidney failure, unspecified: Secondary | ICD-10-CM | POA: Diagnosis present

## 2020-10-13 DIAGNOSIS — Z955 Presence of coronary angioplasty implant and graft: Secondary | ICD-10-CM

## 2020-10-13 DIAGNOSIS — F1410 Cocaine abuse, uncomplicated: Secondary | ICD-10-CM | POA: Diagnosis present

## 2020-10-13 DIAGNOSIS — I25110 Atherosclerotic heart disease of native coronary artery with unstable angina pectoris: Secondary | ICD-10-CM | POA: Diagnosis present

## 2020-10-13 DIAGNOSIS — R000 Tachycardia, unspecified: Secondary | ICD-10-CM

## 2020-10-13 DIAGNOSIS — I472 Ventricular tachycardia: Principal | ICD-10-CM | POA: Diagnosis present

## 2020-10-13 DIAGNOSIS — I214 Non-ST elevation (NSTEMI) myocardial infarction: Secondary | ICD-10-CM

## 2020-10-13 DIAGNOSIS — I2582 Chronic total occlusion of coronary artery: Secondary | ICD-10-CM | POA: Diagnosis present

## 2020-10-13 DIAGNOSIS — R778 Other specified abnormalities of plasma proteins: Secondary | ICD-10-CM

## 2020-10-13 DIAGNOSIS — E785 Hyperlipidemia, unspecified: Secondary | ICD-10-CM | POA: Diagnosis present

## 2020-10-13 DIAGNOSIS — B20 Human immunodeficiency virus [HIV] disease: Secondary | ICD-10-CM

## 2020-10-13 DIAGNOSIS — I428 Other cardiomyopathies: Secondary | ICD-10-CM | POA: Diagnosis present

## 2020-10-13 DIAGNOSIS — Z9114 Patient's other noncompliance with medication regimen: Secondary | ICD-10-CM

## 2020-10-13 DIAGNOSIS — I110 Hypertensive heart disease with heart failure: Secondary | ICD-10-CM | POA: Diagnosis present

## 2020-10-13 DIAGNOSIS — Z20822 Contact with and (suspected) exposure to covid-19: Secondary | ICD-10-CM | POA: Diagnosis present

## 2020-10-13 DIAGNOSIS — I5020 Unspecified systolic (congestive) heart failure: Secondary | ICD-10-CM

## 2020-10-13 DIAGNOSIS — Z21 Asymptomatic human immunodeficiency virus [HIV] infection status: Secondary | ICD-10-CM | POA: Diagnosis present

## 2020-10-13 DIAGNOSIS — E876 Hypokalemia: Secondary | ICD-10-CM | POA: Diagnosis present

## 2020-10-13 DIAGNOSIS — I255 Ischemic cardiomyopathy: Secondary | ICD-10-CM | POA: Diagnosis present

## 2020-10-13 LAB — COMPREHENSIVE METABOLIC PANEL
ALT: 44 U/L (ref 0–44)
AST: 50 U/L — ABNORMAL HIGH (ref 15–41)
Albumin: 4.1 g/dL (ref 3.5–5.0)
Alkaline Phosphatase: 81 U/L (ref 38–126)
Anion gap: 9 (ref 5–15)
BUN: 31 mg/dL — ABNORMAL HIGH (ref 8–23)
CO2: 27 mmol/L (ref 22–32)
Calcium: 9.6 mg/dL (ref 8.9–10.3)
Chloride: 104 mmol/L (ref 98–111)
Creatinine, Ser: 1.42 mg/dL — ABNORMAL HIGH (ref 0.61–1.24)
GFR, Estimated: 56 mL/min — ABNORMAL LOW (ref >60)
Glucose, Bld: 176 mg/dL — ABNORMAL HIGH (ref 70–99)
Potassium: 3.5 mmol/L (ref 3.5–5.1)
Sodium: 140 mmol/L (ref 135–145)
Total Bilirubin: 0.7 mg/dL (ref 0.3–1.2)
Total Protein: 7.5 g/dL (ref 6.5–8.1)

## 2020-10-13 LAB — CBC WITH DIFFERENTIAL/PLATELET
Abs Immature Granulocytes: 0.04 10*3/uL (ref 0.00–0.07)
Basophils Absolute: 0.1 10*3/uL (ref 0.0–0.1)
Basophils Relative: 1 %
Eosinophils Absolute: 0.2 10*3/uL (ref 0.0–0.5)
Eosinophils Relative: 2 %
HCT: 45.9 % (ref 39.0–52.0)
Hemoglobin: 15.4 g/dL (ref 13.0–17.0)
Immature Granulocytes: 0 %
Lymphocytes Relative: 51 %
Lymphs Abs: 5.6 10*3/uL — ABNORMAL HIGH (ref 0.7–4.0)
MCH: 31.3 pg (ref 26.0–34.0)
MCHC: 33.6 g/dL (ref 30.0–36.0)
MCV: 93.3 fL (ref 80.0–100.0)
Monocytes Absolute: 1 10*3/uL (ref 0.1–1.0)
Monocytes Relative: 9 %
Neutro Abs: 4 10*3/uL (ref 1.7–7.7)
Neutrophils Relative %: 37 %
Platelets: 228 10*3/uL (ref 150–400)
RBC: 4.92 MIL/uL (ref 4.22–5.81)
RDW: 14.7 % (ref 11.5–15.5)
WBC: 11 10*3/uL — ABNORMAL HIGH (ref 4.0–10.5)
nRBC: 0 % (ref 0.0–0.2)

## 2020-10-13 LAB — MAGNESIUM: Magnesium: 2.3 mg/dL (ref 1.7–2.4)

## 2020-10-13 LAB — RESP PANEL BY RT-PCR (FLU A&B, COVID) ARPGX2
Influenza A by PCR: NEGATIVE
Influenza B by PCR: NEGATIVE
SARS Coronavirus 2 by RT PCR: NEGATIVE

## 2020-10-13 LAB — TROPONIN I (HIGH SENSITIVITY): Troponin I (High Sensitivity): 163 ng/L (ref <18)

## 2020-10-13 MED ORDER — MIDAZOLAM HCL 5 MG/5ML IJ SOLN
INTRAMUSCULAR | Status: AC | PRN
  Administered 2020-10-13: 2 mg via INTRAVENOUS

## 2020-10-13 MED ORDER — MIDAZOLAM HCL 2 MG/2ML IJ SOLN
INTRAMUSCULAR | Status: AC
  Filled 2020-10-13: qty 2

## 2020-10-13 MED ORDER — AMIODARONE HCL IN DEXTROSE 360-4.14 MG/200ML-% IV SOLN
INTRAVENOUS | Status: AC
  Administered 2020-10-13: 150 mg via INTRAVENOUS
  Filled 2020-10-13: qty 200

## 2020-10-13 MED ORDER — AMIODARONE HCL IN DEXTROSE 360-4.14 MG/200ML-% IV SOLN
60.0000 mg/h | INTRAVENOUS | Status: DC
  Administered 2020-10-13 – 2020-10-14 (×2): 60 mg/h via INTRAVENOUS

## 2020-10-13 MED ORDER — AMIODARONE LOAD VIA INFUSION
150.0000 mg | Freq: Once | INTRAVENOUS | Status: AC
  Filled 2020-10-13: qty 83.34

## 2020-10-13 MED ORDER — AMIODARONE HCL IN DEXTROSE 360-4.14 MG/200ML-% IV SOLN
30.0000 mg/h | INTRAVENOUS | Status: DC
  Administered 2020-10-14 – 2020-10-15 (×4): 30 mg/h via INTRAVENOUS
  Filled 2020-10-13 (×5): qty 200

## 2020-10-13 MED ORDER — ASPIRIN 81 MG PO CHEW
324.0000 mg | CHEWABLE_TABLET | Freq: Once | ORAL | Status: AC
  Administered 2020-10-14: 324 mg via ORAL
  Filled 2020-10-13: qty 4

## 2020-10-13 NOTE — ED Provider Notes (Signed)
[facility] Emergency Department Provider Note ____________________________________________   Event Date/Time   First MD Initiated Contact with Patient 10/13/20 2239     (approximate)  I have reviewed the triage vital signs and the nursing notes.   HISTORY  Chief Complaint Chest Pain    HPI Cullan Launer is a 65 y.o. male with PMH as noted below including CAD, CHF, hypertension, hyperlipidemia, HIV, and recent admission for likely V. tach who presents with near syncope, acute onset within the last hour while he was at a store.  It is associated with chest pain, shortness of breath, and palpitations.  He states it feels similar to prior episodes of what he refers to as A. fib.  He states that he had this recently and was hospitalized but left AMA.  Past Medical History:  Diagnosis Date   Change in hearing, bilateral 06/28/2018   Chronic systolic congestive heart failure (HCC) 05/14/2019   a. 06/2019 Echo: EF 30-35%, glob HK. gr1 DD. Mod elev RVSP. Midly dil LA, Triv MR/AI. Asc Ao 4mm.   Coronary artery disease    a. 03/2005 Ant STEMI s/p PCI/DES to the LAD; b. 05/2010 St echo: reportedly nl.   HIV disease (HCC) 06/28/2018   Hyperlipidemia LDL goal <70    Hypertension 06/28/2018   Hypothyroidism 01/07/2019   Lateral epicondylitis 01/07/2019   Noncompliance    STEMI (ST elevation myocardial infarction) (HCC)    Tobacco abuse     Patient Active Problem List   Diagnosis Date Noted   Unstable angina (HCC)    Acute on chronic systolic heart failure (HCC)    Ventricular tachyarrhythmia (HCC) 10/07/2020   Shortness of breath 09/14/2020   Pain and swelling of knee, right 09/14/2020   Sciatica of left side 08/26/2019   Chronic systolic congestive heart failure (HCC) 05/14/2019   Other specified hypothyroidism 01/07/2019   HIV disease (HCC) 06/28/2018   Change in hearing, bilateral 06/28/2018   Hypertension 06/28/2018   Healthcare maintenance 06/28/2018     Past Surgical History:  Procedure Laterality Date   CORONARY ANGIOPLASTY WITH STENT PLACEMENT     LEFT HEART CATH AND CORONARY ANGIOGRAPHY N/A 10/08/2020   Procedure: LEFT HEART CATH AND CORONARY ANGIOGRAPHY;  Surgeon: Iran Ouch, MD;  Location: [facility] INVASIVE CV LAB;  Service: Cardiovascular;  Laterality: N/A;    Prior to Admission medications   Not on File    Allergies Plavix [clopidogrel bisulfate]  History reviewed. No pertinent family history.  Social History Social History   Tobacco Use   Smoking status: Every Day    Packs/day: 1.00    Years: 42.00    Pack years: 42.00    Types: Cigarettes    Start date: 01/07/1968   Smokeless tobacco: Never   Tobacco comments:    Smoked 1 ppd for most of his adult life but now smoking ~ 1/3 ppd.  Vaping Use   Vaping Use: Never used  Substance Use Topics   Alcohol use: Yes    Alcohol/week: 7.0 standard drinks    Types: 7 Cans of beer per week    Comment: 1 can/beer/daily   Drug use: Yes    Types: Amphetamines    Comment: smokes or snorts crystal meth 2-3x/wk.    Review of Systems  Constitutional: No fever. Eyes: No redness. ENT: No sore throat. Cardiovascular: Positive for chest pain. Respiratory: Positive for shortness of breath. Gastrointestinal: No vomiting or diarrhea.  Genitourinary: Negative for flank pain. Musculoskeletal: Negative for back pain.  Skin: Negative for rash. Neurological: Negative for headache.   ____________________________________________   PHYSICAL EXAM:  VITAL SIGNS: ED Triage Vitals  Enc Vitals Group     BP 10/13/20 2240 (!) 162/102     Pulse Rate 10/13/20 2235 (!) 196     Resp 10/13/20 2235 20     Temp --      Temp src --      SpO2 10/13/20 2240 97 %     Weight --      Height --      Head Circumference --      Peak Flow --      Pain Score --      Pain Loc --      Pain Edu? --      Excl. in GC? --     Constitutional: Alert and oriented.  Uncomfortable and pale  appearing.   Eyes: Conjunctivae are normal.  Head: Atraumatic. Nose: No congestion/rhinnorhea. Mouth/Throat: Mucous membranes are moist.   Neck: Normal range of motion.  Cardiovascular: Tachycardic, regular rhythm. Grossly normal heart sounds.  Good peripheral circulation. Respiratory: Normal respiratory effort.  No retractions. Lungs CTAB. Gastrointestinal: No distention.  Musculoskeletal: No lower extremity edema.  Extremities warm and well perfused.  Neurologic:  Normal speech and language. No gross focal neurologic deficits are appreciated.  Skin:  Skin is warm and dry. No rash noted. Psychiatric: Anxious appearing  ____________________________________________   LABS (all labs ordered are listed, but only abnormal results are displayed)  Labs Reviewed  CBC WITH DIFFERENTIAL/PLATELET - Abnormal; Notable for the following components:      Result Value   WBC 11.0 (*)    All other components within normal limits  RESP PANEL BY RT-PCR (FLU A&B, COVID) ARPGX2  COMPREHENSIVE METABOLIC PANEL  BRAIN NATRIURETIC PEPTIDE  URINALYSIS, COMPLETE (UACMP) WITH MICROSCOPIC  URINE DRUG SCREEN, QUALITATIVE ([facility] ONLY)  TROPONIN I (HIGH SENSITIVITY)   ____________________________________________  EKG  ED ECG REPORT I, Dionne Bucy, the attending physician, personally viewed and interpreted this ECG.  Date: 10/13/2020 EKG Time: 2236 Rate: 197 Rhythm: Wide-complex tachycardia QRS Axis: Right axis Intervals: LBBB Narrative Interpretation: wide-complex tachycardia; V. tach versus SVT with aberrancy   ED ECG REPORT I, Dionne Bucy, the attending physician, personally viewed and interpreted this ECG.  Date: 10/13/2020 EKG Time: 2238 Rate: 96 Rhythm: normal sinus rhythm QRS Axis: normal Intervals: normal ST/T Wave abnormalities: Nonspecific ST abnormalities Narrative Interpretation: Nonspecific ST abnormalities with no evidence of acute  ischemia   ____________________________________________  RADIOLOGY  Chest x-ray interpreted by me shows cardiomegaly with no focal consolidation or edema  ____________________________________________   PROCEDURES  Procedure(s) performed: Yes  .Cardioversion  Date/Time: 10/13/2020 10:56 PM Performed by: Dionne Bucy, MD Authorized by: Dionne Bucy, MD   Consent:    Consent obtained:  Verbal   Consent given by:  Patient   Risks discussed:  Death, induced arrhythmia and pain   Alternatives discussed:  Rate-control medication Pre-procedure details:    Cardioversion basis:  Emergent   Rhythm:  Ventricular tachycardia   Electrode placement:  Anterior-posterior Patient sedated: No Attempt one:    Cardioversion mode:  Synchronous   Waveform:  Biphasic   Shock (Joules):  120   Shock outcome:  Conversion to normal sinus rhythm Post-procedure details:    Patient status:  Awake   Patient tolerance of procedure:  Tolerated well, no immediate complications  Critical Care performed: Yes  CRITICAL CARE Performed by: Dionne Bucy   Total critical care time: 30  minutes  Critical care time was exclusive of separately billable procedures and treating other patients.  Critical care was necessary to treat or prevent imminent or life-threatening deterioration.  Critical care was time spent personally by me on the following activities: development of treatment plan with patient and/or surrogate as well as nursing, discussions with consultants, evaluation of patient's response to treatment, examination of patient, obtaining history from patient or surrogate, ordering and performing treatments and interventions, ordering and review of laboratory studies, ordering and review of radiographic studies, pulse oximetry and re-evaluation of patient's condition.  ____________________________________________   INITIAL IMPRESSION / ASSESSMENT AND PLAN / ED COURSE  Pertinent  labs & imaging results that were available during my care of the patient were reviewed by me and considered in my medical decision making (see chart for details).   65 year old male with PMH as noted above including CAD, CHF, hypertension, hyperlipidemia, HIV, and recent admission for likely V. tach presents with near syncope and chest pain, with EKG showing wide-complex tachycardia concerning for V. tach.  The patient was brought from triage and placed in exam room.  Monitor showed wide-complex tachycardia consistent with V. tach.  The patient confirmed that he was feeling symptoms similar to prior episodes of what he initially called "A. fib" although I clarified that he actually meant V. tach.  On exam, he appeared pale and weak.  His heart rate was around 190.  Blood pressure monitor had difficulty obtaining a pressure.  Given the wide-complex tachycardia with active chest pain, pallor, and likely hypotension, we proceeded with synchronized electrical cardioversion.  I gave Versed for anxiolysis.  The patient converted to sinus rhythm on the first attempt and reported immediate improvement in his symptoms.  I reviewed the past medical records in epic.  The patient was admitted at the end of last month with a similar presentation and was found to be in wide-complex tachycardia, likely V. tach.  He was electrically cardioverted by EMS and placed on amiodarone drip.  However, he left AMA.  I have ordered lab work-up and initiated amiodarone infusion.  Plan will be for admission.  ----------------------------------------- 11:19 PM on 10/13/2020 -----------------------------------------  Lab work-up is pending.  I signed the patient out to the oncoming ED physician Dr. Elesa Massed.  ____________________________________________   FINAL CLINICAL IMPRESSION(S) / ED DIAGNOSES  Final diagnoses:  Wide-complex tachycardia (HCC)      NEW MEDICATIONS STARTED DURING THIS VISIT:  New Prescriptions    No medications on file     Note:  This document was prepared using Dragon voice recognition software and may include unintentional dictation errors.    Dionne Bucy, MD 10/13/20 2320

## 2020-10-13 NOTE — ED Triage Notes (Signed)
Pt assisted out of neighbors car by Clinical research associate. Pt diaphoretic and pale. Per neighbor pt fell at local convenience store and she followed pt back to home where he reported having chest pain and was pale. Neighbor report pt has had recent MI and was to have defibrillator placed but has continued to [facility] from hospital and not followed up with cardiology. Pt taken directly to room 24 and placed on monitor. HR at 195bpm. MD and staff in room with placement of Dfib pads.

## 2020-10-14 DIAGNOSIS — N179 Acute kidney failure, unspecified: Secondary | ICD-10-CM | POA: Diagnosis present

## 2020-10-14 DIAGNOSIS — E876 Hypokalemia: Secondary | ICD-10-CM | POA: Diagnosis present

## 2020-10-14 DIAGNOSIS — I2582 Chronic total occlusion of coronary artery: Secondary | ICD-10-CM | POA: Diagnosis present

## 2020-10-14 DIAGNOSIS — R778 Other specified abnormalities of plasma proteins: Secondary | ICD-10-CM

## 2020-10-14 DIAGNOSIS — F17210 Nicotine dependence, cigarettes, uncomplicated: Secondary | ICD-10-CM | POA: Diagnosis present

## 2020-10-14 DIAGNOSIS — R570 Cardiogenic shock: Secondary | ICD-10-CM | POA: Diagnosis not present

## 2020-10-14 DIAGNOSIS — I472 Ventricular tachycardia: Secondary | ICD-10-CM | POA: Diagnosis not present

## 2020-10-14 DIAGNOSIS — E559 Vitamin D deficiency, unspecified: Secondary | ICD-10-CM | POA: Diagnosis present

## 2020-10-14 DIAGNOSIS — F1510 Other stimulant abuse, uncomplicated: Secondary | ICD-10-CM | POA: Diagnosis present

## 2020-10-14 DIAGNOSIS — I5022 Chronic systolic (congestive) heart failure: Secondary | ICD-10-CM | POA: Diagnosis present

## 2020-10-14 DIAGNOSIS — I5020 Unspecified systolic (congestive) heart failure: Secondary | ICD-10-CM | POA: Diagnosis not present

## 2020-10-14 DIAGNOSIS — R0683 Snoring: Secondary | ICD-10-CM | POA: Diagnosis present

## 2020-10-14 DIAGNOSIS — Z9114 Patient's other noncompliance with medication regimen: Secondary | ICD-10-CM | POA: Diagnosis not present

## 2020-10-14 DIAGNOSIS — I214 Non-ST elevation (NSTEMI) myocardial infarction: Secondary | ICD-10-CM | POA: Diagnosis not present

## 2020-10-14 DIAGNOSIS — Z21 Asymptomatic human immunodeficiency virus [HIV] infection status: Secondary | ICD-10-CM | POA: Diagnosis present

## 2020-10-14 DIAGNOSIS — Z7189 Other specified counseling: Secondary | ICD-10-CM | POA: Diagnosis not present

## 2020-10-14 DIAGNOSIS — E039 Hypothyroidism, unspecified: Secondary | ICD-10-CM | POA: Diagnosis present

## 2020-10-14 DIAGNOSIS — I110 Hypertensive heart disease with heart failure: Secondary | ICD-10-CM | POA: Diagnosis present

## 2020-10-14 DIAGNOSIS — I255 Ischemic cardiomyopathy: Secondary | ICD-10-CM | POA: Diagnosis present

## 2020-10-14 DIAGNOSIS — F1410 Cocaine abuse, uncomplicated: Secondary | ICD-10-CM | POA: Diagnosis present

## 2020-10-14 DIAGNOSIS — Z20822 Contact with and (suspected) exposure to covid-19: Secondary | ICD-10-CM | POA: Diagnosis present

## 2020-10-14 DIAGNOSIS — Z955 Presence of coronary angioplasty implant and graft: Secondary | ICD-10-CM | POA: Diagnosis not present

## 2020-10-14 DIAGNOSIS — I428 Other cardiomyopathies: Secondary | ICD-10-CM | POA: Diagnosis present

## 2020-10-14 DIAGNOSIS — I25110 Atherosclerotic heart disease of native coronary artery with unstable angina pectoris: Secondary | ICD-10-CM | POA: Diagnosis present

## 2020-10-14 DIAGNOSIS — R000 Tachycardia, unspecified: Secondary | ICD-10-CM

## 2020-10-14 DIAGNOSIS — F1910 Other psychoactive substance abuse, uncomplicated: Secondary | ICD-10-CM | POA: Diagnosis not present

## 2020-10-14 DIAGNOSIS — I420 Dilated cardiomyopathy: Secondary | ICD-10-CM | POA: Diagnosis not present

## 2020-10-14 DIAGNOSIS — E785 Hyperlipidemia, unspecified: Secondary | ICD-10-CM | POA: Diagnosis present

## 2020-10-14 LAB — URINE DRUG SCREEN, QUALITATIVE (ARMC ONLY)
Amphetamines, Ur Screen: POSITIVE — AB
Barbiturates, Ur Screen: NOT DETECTED
Benzodiazepine, Ur Scrn: POSITIVE — AB
Cannabinoid 50 Ng, Ur ~~LOC~~: NOT DETECTED
Cocaine Metabolite,Ur ~~LOC~~: POSITIVE — AB
MDMA (Ecstasy)Ur Screen: NOT DETECTED
Methadone Scn, Ur: NOT DETECTED
Opiate, Ur Screen: NOT DETECTED
Phencyclidine (PCP) Ur S: NOT DETECTED
Tricyclic, Ur Screen: NOT DETECTED

## 2020-10-14 LAB — BASIC METABOLIC PANEL
Anion gap: 9 (ref 5–15)
Anion gap: 7 (ref 5–15)
BUN: 30 mg/dL — ABNORMAL HIGH (ref 8–23)
BUN: 30 mg/dL — ABNORMAL HIGH (ref 8–23)
CO2: 23 mmol/L (ref 22–32)
CO2: 25 mmol/L (ref 22–32)
Calcium: 8.8 mg/dL — ABNORMAL LOW (ref 8.9–10.3)
Calcium: 9 mg/dL (ref 8.9–10.3)
Chloride: 106 mmol/L (ref 98–111)
Chloride: 106 mmol/L (ref 98–111)
Creatinine, Ser: 1.14 mg/dL (ref 0.61–1.24)
Creatinine, Ser: 1.04 mg/dL (ref 0.61–1.24)
GFR, Estimated: >60 mL/min (ref >60)
GFR, Estimated: >60 mL/min (ref >60)
Glucose, Bld: 103 mg/dL — ABNORMAL HIGH (ref 70–99)
Glucose, Bld: 112 mg/dL — ABNORMAL HIGH (ref 70–99)
Potassium: 3.5 mmol/L (ref 3.5–5.1)
Potassium: 3.7 mmol/L (ref 3.5–5.1)
Sodium: 138 mmol/L (ref 135–145)
Sodium: 138 mmol/L (ref 135–145)

## 2020-10-14 LAB — CBC
HCT: 44.3 % (ref 39.0–52.0)
HCT: 46.5 % (ref 39.0–52.0)
Hemoglobin: 14.9 g/dL (ref 13.0–17.0)
Hemoglobin: 15.6 g/dL (ref 13.0–17.0)
MCH: 30.8 pg (ref 26.0–34.0)
MCH: 30.4 pg (ref 26.0–34.0)
MCHC: 33.6 g/dL (ref 30.0–36.0)
MCHC: 33.5 g/dL (ref 30.0–36.0)
MCV: 91.5 fL (ref 80.0–100.0)
MCV: 90.6 fL (ref 80.0–100.0)
Platelets: 200 10*3/uL (ref 150–400)
Platelets: 202 10*3/uL (ref 150–400)
RBC: 4.84 MIL/uL (ref 4.22–5.81)
RBC: 5.13 MIL/uL (ref 4.22–5.81)
RDW: 14.9 % (ref 11.5–15.5)
RDW: 14.9 % (ref 11.5–15.5)
WBC: 9.1 10*3/uL (ref 4.0–10.5)
WBC: 8 10*3/uL (ref 4.0–10.5)
nRBC: 0 % (ref 0.0–0.2)
nRBC: 0 % (ref 0.0–0.2)

## 2020-10-14 LAB — URINALYSIS, COMPLETE (UACMP) WITH MICROSCOPIC
Bacteria, UA: NONE SEEN
Bilirubin Urine: NEGATIVE
Glucose, UA: 50 mg/dL — AB
Hgb urine dipstick: NEGATIVE
Ketones, ur: NEGATIVE mg/dL
Leukocytes,Ua: NEGATIVE
Nitrite: NEGATIVE
Protein, ur: NEGATIVE mg/dL
Specific Gravity, Urine: 1.018 (ref 1.005–1.030)
Squamous Epithelial / HPF: NONE SEEN (ref 0–5)
pH: 6 (ref 5.0–8.0)

## 2020-10-14 LAB — GLUCOSE, CAPILLARY: Glucose-Capillary: 100 mg/dL — ABNORMAL HIGH (ref 70–99)

## 2020-10-14 LAB — PROTIME-INR
INR: 1 (ref 0.8–1.2)
Prothrombin Time: 13.3 seconds (ref 11.4–15.2)

## 2020-10-14 LAB — APTT: aPTT: 27 seconds (ref 24–36)

## 2020-10-14 LAB — MAGNESIUM: Magnesium: 1.9 mg/dL (ref 1.7–2.4)

## 2020-10-14 LAB — HEPARIN LEVEL (UNFRACTIONATED)
Heparin Unfractionated: 0.55 IU/mL (ref 0.30–0.70)
Heparin Unfractionated: 0.44 IU/mL (ref 0.30–0.70)

## 2020-10-14 LAB — BRAIN NATRIURETIC PEPTIDE: B Natriuretic Peptide: 776.4 pg/mL — ABNORMAL HIGH (ref 0.0–100.0)

## 2020-10-14 LAB — TROPONIN I (HIGH SENSITIVITY): Troponin I (High Sensitivity): 140 ng/L (ref <18)

## 2020-10-14 LAB — PHOSPHORUS: Phosphorus: 4.7 mg/dL — ABNORMAL HIGH (ref 2.5–4.6)

## 2020-10-14 MED ORDER — ASPIRIN EC 81 MG PO TBEC
81.0000 mg | DELAYED_RELEASE_TABLET | Freq: Every day | ORAL | Status: DC
  Administered 2020-10-15 – 2020-10-17 (×3): 81 mg via ORAL
  Filled 2020-10-14 (×4): qty 1

## 2020-10-14 MED ORDER — IPRATROPIUM-ALBUTEROL 0.5-2.5 (3) MG/3ML IN SOLN: 3.0000 mL | Freq: Four times a day (QID) | RESPIRATORY_TRACT | Status: DC | PRN

## 2020-10-14 MED ORDER — MORPHINE SULFATE (PF) 2 MG/ML IV SOLN: 2.0000 mg | INTRAVENOUS | Status: DC | PRN

## 2020-10-14 MED ORDER — DOCUSATE SODIUM 100 MG PO CAPS: 100.0000 mg | ORAL_CAPSULE | Freq: Two times a day (BID) | ORAL | Status: DC | PRN

## 2020-10-14 MED ORDER — NITROGLYCERIN 0.4 MG SL SUBL: 0.4000 mg | SUBLINGUAL_TABLET | SUBLINGUAL | Status: DC | PRN

## 2020-10-14 MED ORDER — CHLORHEXIDINE GLUCONATE CLOTH 2 % EX PADS
6.0000 | MEDICATED_PAD | Freq: Every day | CUTANEOUS | Status: DC
  Administered 2020-10-16 – 2020-10-17 (×2): 6 via TOPICAL

## 2020-10-14 MED ORDER — POLYETHYLENE GLYCOL 3350 17 G PO PACK: 17.0000 g | PACK | Freq: Every day | ORAL | Status: DC | PRN

## 2020-10-14 MED ORDER — HEPARIN BOLUS VIA INFUSION
4000.0000 [IU] | Freq: Once | INTRAVENOUS | Status: AC
  Administered 2020-10-14: 4000 [IU] via INTRAVENOUS
  Filled 2020-10-14: qty 4000

## 2020-10-14 MED ORDER — HEPARIN (PORCINE) 25000 UT/250ML-% IV SOLN
1200.0000 [IU]/h | INTRAVENOUS | Status: DC
  Administered 2020-10-14: 1200 [IU]/h via INTRAVENOUS
  Filled 2020-10-14: qty 250

## 2020-10-14 MED ORDER — ATORVASTATIN CALCIUM 20 MG PO TABS
20.0000 mg | ORAL_TABLET | Freq: Every day | ORAL | Status: DC
  Administered 2020-10-15 – 2020-10-17 (×3): 20 mg via ORAL
  Filled 2020-10-14 (×3): qty 1

## 2020-10-14 NOTE — Progress Notes (Signed)
Pt HR progressively decreased over course of day. Marisue Ivan PA notified and aware. Heparin gtt d/c'd. Pt remains on Amio 30 mg/hr.   Pt alert and oriented. Pt started on heart healthy diet. Pt voiding independently.   Pt in no acute distress @ this time. Will continue to monitor.

## 2020-10-14 NOTE — ED Provider Notes (Signed)
  Physical Exam  BP 117/78   Pulse 83   Temp 97.8 F (36.6 C) (Oral)   Resp (!) 25   Ht 6\' 1"  (1.854 m)   Wt 88.5 kg   SpO2 100%   BMI 25.73 kg/m   Physical Exam  ED Course/Procedures     Procedures  MDM  12:20 AM  Assumed care of patient in signout.  Here for wide-complex tachycardia that resolved after electrocardioversion.  History of methamphetamine abuse.  Was just recently admitted to the hospital for the same and left AGAINST MEDICAL ADVICE.  Reports feeling much better after cardioversion and is in a sinus rhythm.  No ischemia noted on EKG but does have prolonged QT interval, right bundle branch block.  Electrolytes within normal limits.  Elevated troponin.  On amiodarone infusion.  We will also start heparin.  Discussed with Dr. with hospitalist service who requests critical care admission.  Discussed with Arville Care, NP with critical care who will admit patient.       Srihari Shellhammer, Webb Silversmith, DO 10/14/20 0021

## 2020-10-14 NOTE — H&P (Addendum)
NAME:  Douglas Conway, MRN:  [id number], DOB:  [birth year], LOS: 0 ADMISSION DATE:  10/13/2020, CONSULTATION DATE:  10/14/2020 REFERRING MD: Pryor Curia MD  CHIEF COMPLAINT:  Chest pain   History of present illness   64 y.o male with significant history of CAD, ICM, HFrEF, HIV, hypothyroidism, HTN, HLD, polysubstance abuse presenting to the ED with chief complaints of sudden onset chest pain.  Patient was recently admitted on 6/29 on with wide complex tachycardia and NSTEMI s/p left heart cath and coronary angiography showing proximal RCA lesion 20% stenosed and proximal LAD to mid LAD lesion 100% stenosed LVEF of 15%.  Patient was seen by cardiology with recommendation for advanced heart failure management including urgent EP evaluation for ICD's/CRT for presumed ventricular tachycardia however patient left AMA and did not follow-up as recommended on outpatient basis.  Patient presented back to the ED last night with chief complaints of chest pain with near syncopal episode.  Per ED notes, patient was diaphoretic and pale.  He apparently fell at a local convenience store according to unable who followed him back to his house and subsequently brought him to the ED for further evaluation.  ED: On arrival to the ED, he was afebrile with blood pressure 162/102 mm Hg and pulse rate 196 beats/min. There were no focal neurological deficits; he was however pale appearing and weak.     Initial Labs/Diagnostics in the ED included: WBC/Hgb/Hct/Plts:  11.0/15.4/45.9/228 (07/05 2244)  Glucose 176, BUN/Cr 31/1.42, AST 50 SARS coronavirus 2 by RT PCR Negative Troponin 163> BNP 776.4 EKG: wide-complex tachycardia rate of 197 bpm; V. tach versus SVT with aberrancy Does have a left bundle branch block CXR: Chronic appearing increased lung markings without acute or active cardiopulmonary disease. [id number] UDS: +Meth and Cocaine  Given the wide-complex tachycardia with active chest pain, pallor and diaphoresis, EDP  proceeded with synchronized electrical cardioversion (120 J) with immediate conversion  to sinus rhythm on the first attempt and immediate improvement of symptoms.  Patient was started on amiodarone infusion for rate control and also on heparin drip. PCCM was consulted for admission to stepdown/ICU for further management/work-up.  Past Medical History  Ischemic cardiomyopathy  Chronic systolic CHF Coronary artery disease status post STEMI in 2006 with stenting to LAD Hypertension Hyperlipidemia HIV Thyroid disease Lateral epicondylitis  Significant Hospital Events   7/6: Admitted to ICU  Consults:  Cardiology  Procedures:  7/5: S/p synchronized electrical cardioversion 120 Joules  Significant Diagnostic Tests:  7/5: Chest Xray>Chronic appearing increased lung markings without acute or active cardiopulmonary disease.  Micro Data:  7/5: SARS-CoV-2 PCR>> negative 7/5: Influenza PCR>> negative  Antimicrobials:  None OBJECTIVE  BP 100/81   Pulse 69   Temp 97.8 F (36.6 C) (Oral)   Resp 19   Ht _0  (1.854 m)   Wt 88.5 kg   SpO2 100%   BMI 25.73 kg/m   Physical Examination  GENERAL: 65year-old critically ill patient lying in the bed with no acute distress.  EYES: Pupils equal, round, reactive to light and accommodation. No scleral icterus. Extraocular muscles intact.  HEENT: Head atraumatic, normocephalic. Oropharynx and nasopharynx clear.  NECK:  Supple, no jugular venous distention. No thyroid enlargement, no tenderness.  LUNGS: Normal breath sounds bilaterally, no wheezing, rales,rhonchi or crepitation. No use of accessory muscles of respiration.  CARDIOVASCULAR: S1, S2 normal. No murmurs, rubs, or gallops.  ABDOMEN: Soft, nontender, nondistended. Bowel sounds present. No organomegaly or mass.  EXTREMITIES: No pedal edema, cyanosis, or clubbing.  NEUROLOGIC: Cranial nerves II through XII are intact.  Muscle strength 5/5 in all extremities. Sensation intact. Gait not  checked.  PSYCHIATRIC: The patient is alert and oriented x 3.  SKIN: No obvious rash, lesion, or ulcer.   Labs/imaging that I havepersonally reviewed  (right click and "Reselect all SmartList Selections" daily)     Labs   CBC: Recent Labs  Lab 10/07/20 2238 10/08/20 0610 10/13/20 2244  WBC 4.7 8.3 11.0*  NEUTROABS 2.5  --  4.0  HGB 14.0 14.7 15.4  HCT 42.2 43.0 45.9  MCV 92.3 91.7 93.3  PLT 195 201 228     Basic Metabolic Panel: Recent Labs  Lab 10/07/20 2238 10/08/20 0610 10/13/20 2244  NA 141 138 140  K 4.3 4.3 3.5  CL 111 105 104  CO2 _0 GLUCOSE 211* 192* 176*  BUN 27* 23 31*  CREATININE 1.29* 1.05 1.42*  CALCIUM 8.7* 8.7* 9.6  MG 2.1 2.1 2.3  PHOS  --  4.2  --     GFR: Estimated Creatinine Clearance: 61 mL/min (A) (by C-G formula based on SCr of 1.42 mg/dL (H)). Recent Labs  Lab 10/07/20 2238 10/08/20 0610 10/13/20 2244  WBC 4.7 8.3 11.0*     Liver Function Tests: Recent Labs  Lab 10/13/20 2244  AST 50*  ALT 44  ALKPHOS 81  BILITOT 0.7  PROT 7.5  ALBUMIN 4.1   No results for input(s): LIPASE, AMYLASE in the last 168 hours. No results for input(s): AMMONIA in the last 168 hours.  ABG    Component Value Date/Time   PHART 7.36 08/19/2019 0017   PCO2ART 46 08/19/2019 0017   PO2ART 64 (L) 08/19/2019 0017   HCO3 26.0 08/19/2019 0017   O2SAT 91.2 08/19/2019 0017      Coagulation Profile: Recent Labs  Lab 10/07/20 2238 10/13/20 2244  INR 1.0 1.0     Cardiac Enzymes: No results for input(s): CKTOTAL, CKMB, CKMBINDEX, TROPONINI in the last 168 hours.  HbA1C: No results found for: HGBA1C  CBG: Recent Labs  Lab 10/08/20 0806  GLUCAP 97    Review of Systems:   Review of Systems  Constitutional:  Positive for diaphoresis and malaise/fatigue.  HENT:  Positive for hearing loss. Negative for congestion, ear discharge, ear pain, nosebleeds, sinus pain, sore throat and tinnitus.   Eyes:        Blindness  Respiratory:   Positive for shortness of breath and wheezing. Negative for cough, hemoptysis and stridor.   Cardiovascular:  Positive for chest pain, palpitations and PND. Negative for orthopnea, claudication and leg swelling.  Gastrointestinal:  Positive for constipation and melena. Negative for abdominal pain, diarrhea and heartburn.  Genitourinary: Negative.   Musculoskeletal:  Positive for back pain.  Skin: Negative.   Neurological:  Positive for dizziness and weakness.  Endo/Heme/Allergies: Negative.   Psychiatric/Behavioral:  Positive for substance abuse.    Past Medical History  He,  has a past medical history of Change in hearing, bilateral (76/28/3151), Chronic systolic congestive heart failure ([place]) (05/14/2019), Coronary artery disease, HIV disease ([place]) (06/28/2018), Hyperlipidemia LDL goal <70, Hypertension (06/28/2018), Hypothyroidism (01/07/2019), Lateral epicondylitis (01/07/2019), Noncompliance, STEMI (ST elevation myocardial infarction) ([place]), and Tobacco abuse.   Surgical History    Past Surgical History:  Procedure Laterality Date   CORONARY ANGIOPLASTY WITH STENT PLACEMENT     LEFT HEART CATH AND CORONARY ANGIOGRAPHY N/A 10/08/2020   Procedure: LEFT HEART CATH AND CORONARY ANGIOGRAPHY;  Surgeon: Wellington Hampshire, MD;  Location:  [place] LAB;  Service: Cardiovascular;  Laterality: N/A;     Social History   reports that he has been smoking cigarettes. He started smoking about 52 years ago. He has a 42.00 pack-year smoking history. He has never used smokeless tobacco. He reports current alcohol use of about 7.0 standard drinks of alcohol per week. He reports current drug use. Drug: Amphetamines.   Family History   His family history is not on file.   Allergies Allergies  Allergen Reactions   Plavix [Clopidogrel Bisulfate] Rash     Home Medications  Prior to Admission medications   Medication Sig Start Date End Date Taking? Authorizing Provider  albuterol (VENTOLIN HFA)  108 (90 Base) MCG/ACT inhaler Inhale 2 puffs into the lungs every 6 (six) hours as needed for wheezing or shortness of breath. 08/22/20   Nance Pear, MD  cabotegravir & rilpivirine ER (CABENUVA) 600 & 900 MG/3ML injection INJECT 1 KIT INTO THE MUSCLE EVERY 8 (EIGHT) WEEKS. 06/15/20 06/15/21  [place] Callas, NP  Cholecalciferol 125 MCG (5000 UT) capsule Take 1 capsule (5,000 Units total) by mouth daily. 06/13/19   Dorothy Spark, MD  levothyroxine (SYNTHROID) 175 MCG tablet TAKE 1 TABLET BY MOUTH ONCE DAILY BEFORE BREAKFAST 03/19/20   Dorothy Spark, MD  metoprolol succinate (TOPROL XL) 25 MG 24 hr tablet Take 0.5 tablets (12.5 mg total) by mouth daily. 09/17/20   Sueanne Margarita, MD  rosuvastatin (CRESTOR) 20 MG tablet Take 1 tablet by mouth once daily 11/15/19   Dorothy Spark, MD  sacubitril-valsartan (ENTRESTO) 24-26 MG Take 1 tablet by mouth 2 (two) times daily. 09/17/20   Sueanne Margarita, MD    Scheduled Meds:  aspirin  324 mg Oral Once   heparin  4,000 Units Intravenous Once   Continuous Infusions:  amiodarone Stopped (10/13/20 2308)   Followed by   amiodarone     heparin     PRN Meds:.docusate sodium, ipratropium-albuterol, morphine injection, nitroGLYCERIN, polyethylene glycol   Assessment & Plan:  Wide-complex tachycardia probably ventricular tachycardia likely in the setting of severely reduced EF, dilated LV, MI, CAD and polysubstance abuse UDS + Cocaine & Meth s/p synchronized electrical cardioversion - Follow serial EKGs and troponin - Continuous cardiac monitoring - Continue Amiodarone gtt - Polysubstance abuse counseling - Cardiology Consult / Interventional Assessment will need ICD/  NSTEMI / Unstable Angina  Elevated Troponin PMHx: CAD, MI + Last catheterization 10/08/20>Severe one-vessel coronary artery disease with chronically occluded proximal LAD stent with faint right to left collaterals. EF was severely reduced by echo at 15%. - Trend troponin - NTG +  morphine PRN chest pain or NTG drip if ongoing chest pain  - Anti-coagulation (Heparin drip per ACS protocol) - ASA 39m PO daily - Entresto as BP and renal permits - Hold Metoprolol in the setting of cocaine use - HTN, HLD, DM control as below  HLD  + Goal LDL<100 - Rosuvastatin 259mPO qhs  HTN  + Goal BP <130/80 Particularly if hx MI to protect against remodeling Home meds: Resume Entresto as bp and renal permits, hold metoprolol in the setting of cocaine use  Acute Kidney Injury in the setting of above Cr on admission: 1.42 - Monitor I&O's / urinary output - Follow BMP - Ensure adequate renal perfusion - Avoid nephrotoxic agents as able - Replace electrolytes as indicated  Chronic systolic congestive Heart Failure  Ischemic Cardiomyopathy.  Last Echo 6/30 LVEF <20% - Continuous cardiac monitoring - Maintain MAP  greater than 65 - Afterload, Goal MAP <70: Continue Entresto as renal permits - Hold metoprolol in the setting of cocaine use - Low salt diet  - Cardiology Consult  HIV - Continue cabotegravir & rilpivirine ER (CABENUVA)  - Follows with [facility]  ID  Thyroid Dysfunction - Last TSH 65.394 - Continue synthroid  Best practice:  Diet:  Oral Pain/Anxiety/Delirium protocol (if indicated): No VAP protocol (if indicated): Not indicated DVT prophylaxis: Systemic AC GI prophylaxis: PPI Glucose control:  SSI No Central venous access:  N/A Arterial line:  N/A Foley:  N/A Mobility:  bed rest  PT consulted: N/A Last date of multidisciplinary goals of care discussion [6/30] Code Status:  full code Disposition: ICU   = Goals of Care =  Primary Emergency Contact: Grothaus,Steven(Brother cell: [phone]) Wishes to pursue full aggressive treatment and intervention options, including CPR and intubation  Critical care time: 45 minutes     Rufina Falco, DNP, CCRN, FNP-C, AGACNP-BC Acute Care Nurse Practitioner  [facility]  [facility] Pager: [phone] [place] [facility]  .

## 2020-10-14 NOTE — Progress Notes (Signed)
ANTICOAGULATION CONSULT NOTE  Pharmacy Consult for Heparin  Indication: chest pain/ACS  Allergies  Allergen Reactions   Plavix [Clopidogrel Bisulfate] Rash    Patient Measurements: Height: 6\' 1"  (185.4 cm) Weight: 84.9 kg (187 lb 2.7 oz) IBW/kg (Calculated) : 79.9 Heparin Dosing Weight: 88.5 kg   Vital Signs: Temp: 98.1 F (36.7 C) (07/06 0400) Temp Source: Oral (07/06 0400) BP: 92/60 (07/06 0700) Pulse Rate: 42 (07/06 0700)  Labs: Recent Labs    10/13/20 2244 10/14/20 0157 10/14/20 0636  HGB 15.4 14.9 15.6  HCT 45.9 44.3 46.5  PLT 228 200 202  APTT 27  --   --   LABPROT 13.3  --   --   INR 1.0  --   --   HEPARINUNFRC  --   --  0.55  CREATININE 1.42* 1.14 1.04  TROPONINIHS 163* 140*  --      Estimated Creatinine Clearance: 83.2 mL/min (by C-G formula based on SCr of 1.04 mg/dL).   Medical History: Past Medical History:  Diagnosis Date   Change in hearing, bilateral 06/28/2018   Chronic systolic congestive heart failure (HCC) 05/14/2019   a. 06/2019 Echo: EF 30-35%, glob HK. gr1 DD. Mod elev RVSP. Midly dil LA, Triv MR/AI. Asc Ao 67mm.   Coronary artery disease    a. 03/2005 Ant STEMI s/p PCI/DES to the LAD; b. 05/2010 St echo: reportedly nl.   HIV disease (HCC) 06/28/2018   Hyperlipidemia LDL goal <70    Hypertension 06/28/2018   Hypothyroidism 01/07/2019   Lateral epicondylitis 01/07/2019   Noncompliance    STEMI (ST elevation myocardial infarction) (HCC)    Tobacco abuse      Assessment: Pharmacy consulted to dose heparin in this 65 year old male admitted with ACS/NSTEMI.   No prior anticoag noted.   Goal of Therapy:  Heparin level 0.3-0.7 units/ml Monitor platelets by anticoagulation protocol: Yes   Plan:  7/6 0636 HL 0.55, therapeutic Continue heparin drip at 1200 units/hr Recheck HL at 1300 to confirm CBC daily while on heparin drip  9/6, PharmD 10/14/2020,8:01 AM

## 2020-10-14 NOTE — Consult Note (Signed)
Cardiology Consultation:   Patient ID: Douglas Conway MRN: [id number]; DOB: 04/27/1955  Admit date: 10/13/2020 Date of Consult: 10/14/2020  PCP:  Aviva KluverPcp, No   [facility]  Cardiologist:  Armanda Magicraci Turner, MD  Advanced Practice Provider:  No care team member to display Electrophysiologist:  None [id number]}    Patient Profile:   Douglas Conway is a 65 y.o. male with a hx of CAD, ICM, HFrEF, HIGV, hypothyroidism, HTN, HLD, tobacco use, noncompliance, who is being seen today for the evaluation of VT/WCT in the setting of polysubstance use at the request of Webb SilversmithElizabeth Ouma, NP.  History of Present Illness:   Douglas Conway is a 65 year old male with PMH as above including CAD, ischemic cardiomyopathy, HFrEF, hypertension, hyperlipidemia, HIV, hypothyroidism, tobacco use, and medical noncompliance.    In 2016, he had an anterior STEMI with PCI/DES to the LAD.  EF 25%.  He was started on medical therapy.  His EF remains depressed, he was advised that he needed an ICD, but he declined.    He was lost to follow-up.   He reestablished with [place] in 2021.    06/2019 echo showed EF 30 to 35%, global hypokinesis. Toprol and Entresto were added  with plan to repeat echo. He continued to smoke 1/3 pk cigarettes daily. He also continued methamphetamines and crystal meth 2-3 times per week.   Admitted 10/07/20 for WCT and elevated Tn. Pt had experienced acute onset DOE and a syncopal episode, followed by acute onset CP. He called EMS and found to be in WCT at 180-190bpm. DCCV performed at 100J with restoration of SR. He received amiodarone 150mg  and [facility] SR with 1AVB and LBBB. Code STEMI initiated but called off. Tn 30  441 with AKI and TSH 65.394.   LHC showed p RCA 20%s and p-mLAD lesion 100%s.  LVEF 15%.  RHC with mild to moderate elevated filling pressures, moderate to severe PHTN, and severely reduced CO.  Recommendation for optimization of GDMT.  EP evaluation for  ICD/CRT recommended.  He left AGAINST MEDICAL ADVICE.  On 7/5, [facility] s/p a repeat event that he reports today felt very similar to his event prior to his previous 6/29 admission.  He reports significant diaphoresis and feeling so hot that he tried to go in the freezer section of convenience store.  He was joined by others that recommended that he go to the emergency department.  He was experiencing chest pain and reports a near syncopal episode with dizziness/presyncope, though he did not lose consciousness.  In addition to his chest pain and diaphoresis, he felt fatigue and SOB/DOE.    In the ED, he was hypertensive and tachycardic.  EKG showed wide-complex tachycardia at 197 bpm.  Urine drug screen showed he was positive for both methamphetamines and cocaine.  He underwent DCCV with 120 J and restoration of SR.  He was started on IV amiodarone and admitted to the ICU.  Metoprolol discontinued given cocaine use.  Today, he denies any current chest pain or shortness of breath.  No reported dizziness/presyncope.  He feels his breathing is at baseline.  He denies any lower extremity edema or orthopnea.  He does report a history of lower extremity edema in the past, as well as sometimes feeling as if there is something on his chest when laying flat, though not current.  He reports ongoing tobacco and alcohol use.  He also reported use of methamphetamines/cocaine.  He cannot recall if he used between his admission 6/29 and his most recent 7/5 admission.  He reports an intention to quit all substances for the he is able to get an ICD/CRT, as it was explained to him that compliance with medical therapy and complete cessation of all substances are needed at this time.  Past Medical History:  Diagnosis Date   Change in hearing, bilateral 06/28/2018   Chronic systolic congestive heart failure (HCC) 05/14/2019   a. 06/2019 Echo: EF 30-35%, glob HK. gr1 DD. Mod elev RVSP. Midly dil LA, Triv MR/AI.  Asc Ao 67mm.   Coronary artery disease    a. 03/2005 Ant STEMI s/p PCI/DES to the LAD; b. 05/2010 St echo: reportedly nl.   HIV disease (HCC) 06/28/2018   Hyperlipidemia LDL goal <70    Hypertension 06/28/2018   Hypothyroidism 01/07/2019   Lateral epicondylitis 01/07/2019   Noncompliance    STEMI (ST elevation myocardial infarction) (HCC)    Tobacco abuse     Past Surgical History:  Procedure Laterality Date   CORONARY ANGIOPLASTY WITH STENT PLACEMENT     LEFT HEART CATH AND CORONARY ANGIOGRAPHY N/A 10/08/2020   Procedure: LEFT HEART CATH AND CORONARY ANGIOGRAPHY;  Surgeon: Iran Ouch, MD;  Location: [facility] INVASIVE CV LAB;  Service: Cardiovascular;  Laterality: N/A;     Home Medications:  Prior to Admission medications   Not on File    Inpatient Medications: Scheduled Meds:  Chlorhexidine Gluconate Cloth  6 each Topical Q0600   Continuous Infusions:  amiodarone 30 mg/hr (10/14/20 0740)   heparin 1,200 Units/hr (10/14/20 0110)   PRN Meds: docusate sodium, ipratropium-albuterol, morphine injection, nitroGLYCERIN, polyethylene glycol  Allergies:    Allergies  Allergen Reactions   Plavix [Clopidogrel Bisulfate] Rash    Social History:   Social History   Socioeconomic History   Marital status: Divorced    Spouse name: Not on file   Number of children: Not on file   Years of education: Not on file   Highest education level: Not on file  Occupational History   Not on file  Tobacco Use   Smoking status: Every Day    Packs/day: 1.00    Years: 42.00    Pack years: 42.00    Types: Cigarettes    Start date: 01/07/1968   Smokeless tobacco: Never   Tobacco comments:    Smoked 1 ppd for most of his adult life but now smoking ~ 1/3 ppd.  Vaping Use   Vaping Use: Never used  Substance and Sexual Activity   Alcohol use: Yes    Alcohol/week: 7.0 standard drinks    Types: 7 Cans of beer per week    Comment: 1 can/beer/daily   Drug use: Yes    Types: Amphetamines     Comment: smokes or snorts crystal meth 2-3x/wk.   Sexual activity: Not Currently    Partners: Female    Birth control/protection: Condom    Comment: given condoms 09/14/20  Other Topics Concern   Not on file  Social History Narrative   Lives in [place] by himself.  Works as a Psychologist, occupational.  Does not routinely exercise.   Social Determinants of Health   Financial Resource Strain: Not on file  Food Insecurity: Not on file  Transportation Needs: Not on file  Physical Activity: Not on file  Stress: Not on file  Social Connections: Not on file  Intimate Partner Violence: Not on file    Family History:   History reviewed. No pertinent  family history.  No reported family history of cardiac disease. ROS:  Please see the history of present illness.  He reports chest pain, dyspnea, shortness of breath, dizziness, and a history of edema. He denies pnd, orthopnea, n, v, syncope, or early satiety.  All other ROS reviewed and negative.     Physical Exam/Data:   Vitals:   10/14/20 0500 10/14/20 0600 10/14/20 0700 10/14/20 0800  BP: 91/71 96/68 92/60  92/66  Pulse: 65 (!) 39 (!) 42   Resp: (!) 22 20 (!) 25   Temp:      TempSrc:      SpO2: 91% 92% 92% 94%  Weight:      Height:        Intake/Output Summary (Last 24 hours) at 10/14/2020 0814 Last data filed at 10/14/2020 0105 Gross per 24 hour  Intake 0.02 ml  Output 775 ml  Net -774.98 ml   Last 3 Weights 10/14/2020 10/13/2020 10/08/2020  Weight (lbs) 187 lb 2.7 oz 195 lb 190 lb 4.1 oz  Weight (kg) 84.9 kg 88.451 kg 86.3 kg     Body mass index is 24.69 kg/m.  General:  Sitting up in bed, no acute distress HEENT: normal Lymph: no adenopathy Neck: no JVD Endocrine:  No thryomegaly Vascular: No carotid bruits; FA pulses 2+ bilaterally without bruits  Cardiac:  normal S1, S2; bradycardic but regular; no murmur - cardiac exam limited by pacer pads in place Lungs:  clear to auscultation bilaterally, no wheezing, rhonchi or rales  Abd: soft,  nontender, no hepatomegaly  Ext: no edema Musculoskeletal:  No deformities, BUE and BLE strength normal and equal Skin: warm and dry  Neuro:  CNs 2-12 intact, no focal abnormalities noted Psych:  Normal affect   EKG:  The EKG was personally reviewed and demonstrates:  Sinus rhythm, 76 bpm, first-degree AV block with PR interval 228 ms, left atrial enlargement, IVCD/LVH with LAD, right bundle branch block, IVCD with repolarization abnormalities, prolonged QTC at 541 ms with consideration of bundle-branch block Telemetry:  Telemetry was personally reviewed and demonstrates: Sinus bradycardia -sinus rhythm, PVCs  Relevant CV Studies:  LHC and coronary angiography 10/08/2020 Prox RCA lesion is 20% stenosed. Prox LAD to Mid LAD lesion is 100% stenosed. 1.  Severe one-vessel coronary artery disease with chronically occluded proximal LAD stent with faint right to left collaterals.  No obstructive disease affecting the RCA and left circumflex. 2.  Left ventricular angiography was not performed.  EF was severely reduced by echo at 15%. 3.  Right heart catheterization showed mild to moderate elevation in filling pressures, moderate to severe pulmonary hypertension and severely reduced cardiac output. Recommendations Suspect that the LAD stent has been occluded for a long time.  No benefit from revascularization.  Continue medical therapy. The patient's heart failure needs optimization.  Recommend management by advanced heart failure. In addition, given presumed ventricular tachycardia, recommend urgent EP evaluation for ICD/CRT. Coronary Diagrams   Diagnostic Dominance: Right      Echo 10/08/2020 1. Left ventricular ejection fraction, by estimation, is <20%. The left  ventricle has severely decreased function. The left ventricle demonstrates  global hypokinesis. The left ventricular internal cavity size was severely  dilated. Left ventricular  diastolic parameters are indeterminate.   2.  Right ventricular systolic function is severely reduced. The right  ventricular size is moderately enlarged. There is moderately elevated  pulmonary artery systolic pressure. The estimated right ventricular  systolic pressure is 48.4 mmHg.   3. Left atrial size was  moderately dilated.   4. Right atrial size was severely dilated.   5. The mitral valve is normal in structure. Mild mitral valve  regurgitation.   6. The aortic valve was not well visualized. Aortic valve regurgitation  is not visualized.   7. The inferior vena cava is normal in size with <50% respiratory  variability, suggesting right atrial pressure of 8 mmHg.    Laboratory Data:  High Sensitivity Troponin:   Recent Labs  Lab 10/07/20 2238 10/08/20 0251 10/13/20 2244 10/14/20 0157  TROPONINIHS 30* 441* 163* 140*     Chemistry Recent Labs  Lab 10/13/20 2244 10/14/20 0157 10/14/20 0636  NA 140 138 138  K 3.5 3.5 3.7  CL 104 106 106  CO2 27 23 25   GLUCOSE 176* 103* 112*  BUN 31* 30* 30*  CREATININE 1.42* 1.14 1.04  CALCIUM 9.6 8.8* 9.0  GFRNONAA 56* >60 >60  ANIONGAP 9 9 7     Recent Labs  Lab 10/13/20 2244  PROT 7.5  ALBUMIN 4.1  AST 50*  ALT 44  ALKPHOS 81  BILITOT 0.7   Hematology Recent Labs  Lab 10/13/20 2244 10/14/20 0157 10/14/20 0636  WBC 11.0* 9.1 8.0  RBC 4.92 4.84 5.13  HGB 15.4 14.9 15.6  HCT 45.9 44.3 46.5  MCV 93.3 91.5 90.6  MCH 31.3 30.8 30.4  MCHC 33.6 33.6 33.5  RDW 14.7 14.9 14.9  PLT 228 200 202   BNP Recent Labs  Lab 10/07/20 2230 10/13/20 2244  BNP 595.2* 776.4*    DDimer No results for input(s): DDIMER in the last 168 hours.   Radiology/Studies:  DG Chest Portable 1 View  Result Date: 10/13/2020 CLINICAL DATA:  V-tach EXAM: PORTABLE CHEST 1 VIEW COMPARISON:  10/07/2020 FINDINGS: Increased interstitial markings. No frank interstitial edema. No pleural effusion or pneumothorax. Cardiomegaly. Defibrillator pads overlying the left hemithorax. IMPRESSION:  Cardiomegaly. No evidence of acute cardiopulmonary disease. Electronically Signed   By: 12/14/2020 M.D.   On: 10/13/2020 22:53     Assessment and Plan:   Wide-complex tachycardia --Currently SR-SB with PVCs s/p repeat DCCV. Returns with recurrent WCT and (+) UDS (methamphetamines, cocaine) with associated CP and presyncope.  S/p 10/08/2020 LHC with severe 1v CAD - GDMT / ICD/CRT recommended.   --Continue IV amiodarone for now with pt maintaining SR.  --Toprol discontinued given cocaine on UDS. Consider Coreg before discharge given mixed receptor blockade activity, though with consideration of low OP HF. --Discussed recommendation for cessation of substance use and need for medical compliance before proceeding with CRT/ICD. He is interested in CRT/ICD and states he intends to stop using all substances and demonstrate compliance moving forward. Consider LifeVest until compliance demonstrated. Will need to schedule with EP for formal consultation.   Hypokalemia --K 3.5. Replete with goal 4.0. Check Mg.   NSTEMI, coronary artery disease -- No current CP. History of anterior MI with PCI to LAD.  10/08/2020 LHC with CTO pLAD, faint L-R collaterals, and no benefit if revascularization. Recommendation was for GDMT.  HS Tn elevated at 149 and likely down-trending from previous admission. Started on IV heparin, which can likely be discontinued within ongoing down-trending Tn or within 48h. Suspect supply demand ischemia. No plan for repeat ischemic work-up at this time. Continue to cycle HS Tn. Restart ASA and statin before discharge. Recommendations regarding BB as above. Treatment compliance stressed. Will need follow-up in office.   Ischemic cardiomyopathy/HFrEF --No current SOB. Recent echo above with EF <20%, LV global hypokinesis, RVSP 48.4.  Previously declined ICD/CRT - now agreeable to EP consult for device.  BNP elevated into the 700s. Net -775cc. Wt 84.9kg, up from 83kg at previous admission.  Continue to monitor I/Os, wt. Known elevated pressures on RHC with moderate to severe PHTN and exacerbated in the setting of noncompliance. Continue current GDMT as BP/Cr allows. Currently holding Entresto / BB with restart if able before discharge. Consider addition of spironolactone +/-  SGLT2i in the future. Once he is able to prove medical compliance, CRT/ICD recommended.  Polysubstance abuse -- (+) UDS. Methamphetamines/cocaine 2-3 times per week.  He is aware of the interaction between Toprol and cocaine, reviewed today. Discussed cessation of this and current alcohol and tobacco use.  Complete cessation advised.  Hypothyroidism -- TSH 65.934, FT4 0.74. Continue Synthroid.  Essential hypertension -- Holding all BP medications with soft BP. Resume if Cr/BP allows.  Transaminatis --AST elevated. Continue to monitor. Continue statin, given AST elevation and not elevated 3x upper limit of nl.   Hyperlipidemia -- LDL 108. Continue rosuvastatin.  For questions or updates, please contact [facility] Please consult [email or web address] for contact info under    Signed, Lennon Alstrom, PA-C  10/14/2020 8:14 AM

## 2020-10-14 NOTE — Progress Notes (Signed)
ANTICOAGULATION CONSULT NOTE - Initial Consult  Pharmacy Consult for Heparin  Indication: chest pain/ACS  Allergies  Allergen Reactions   Plavix [Clopidogrel Bisulfate] Rash    Patient Measurements: Height: 6\' 1"  (185.4 cm) Weight: 88.5 kg (195 lb) IBW/kg (Calculated) : 79.9 Heparin Dosing Weight: 88.5 kg   Vital Signs: Temp: 97.8 F (36.6 C) (07/05 2257) Temp Source: Oral (07/05 2257) BP: 117/78 (07/05 2300) Pulse Rate: 83 (07/05 2300)  Labs: Recent Labs    10/13/20 2244  HGB 15.4  HCT 45.9  PLT 228  CREATININE 1.42*  TROPONINIHS 163*    Estimated Creatinine Clearance: 61 mL/min (A) (by C-G formula based on SCr of 1.42 mg/dL (H)).   Medical History: Past Medical History:  Diagnosis Date   Change in hearing, bilateral 06/28/2018   Chronic systolic congestive heart failure (HCC) 05/14/2019   a. 06/2019 Echo: EF 30-35%, glob HK. gr1 DD. Mod elev RVSP. Midly dil LA, Triv MR/AI. Asc Ao 25mm.   Coronary artery disease    a. 03/2005 Ant STEMI s/p PCI/DES to the LAD; b. 05/2010 St echo: reportedly nl.   HIV disease (HCC) 06/28/2018   Hyperlipidemia LDL goal <70    Hypertension 06/28/2018   Hypothyroidism 01/07/2019   Lateral epicondylitis 01/07/2019   Noncompliance    STEMI (ST elevation myocardial infarction) (HCC)    Tobacco abuse     Medications:  (Not in a hospital admission)   Assessment: Pharmacy consulted to dose heparin in this 65 year old male admitted with ACS/NSTEMI.   No prior anticoag noted. CrCl = 61 ml/min  Goal of Therapy:  Heparin level 0.3-0.7 units/ml Monitor platelets by anticoagulation protocol: Yes   Plan:  Give 4000 units bolus x 1 Start heparin infusion at 1200 units/hr Check anti-Xa level in 6 hours and daily while on heparin Continue to monitor H&H and platelets  Douglas Conway D 10/14/2020,12:21 AM

## 2020-10-15 DIAGNOSIS — E785 Hyperlipidemia, unspecified: Secondary | ICD-10-CM

## 2020-10-15 DIAGNOSIS — F1910 Other psychoactive substance abuse, uncomplicated: Secondary | ICD-10-CM

## 2020-10-15 DIAGNOSIS — E039 Hypothyroidism, unspecified: Secondary | ICD-10-CM

## 2020-10-15 DIAGNOSIS — I420 Dilated cardiomyopathy: Secondary | ICD-10-CM

## 2020-10-15 DIAGNOSIS — I5022 Chronic systolic (congestive) heart failure: Secondary | ICD-10-CM

## 2020-10-15 DIAGNOSIS — E559 Vitamin D deficiency, unspecified: Secondary | ICD-10-CM

## 2020-10-15 DIAGNOSIS — R570 Cardiogenic shock: Secondary | ICD-10-CM

## 2020-10-15 DIAGNOSIS — Z515 Encounter for palliative care: Secondary | ICD-10-CM

## 2020-10-15 DIAGNOSIS — Z7189 Other specified counseling: Secondary | ICD-10-CM

## 2020-10-15 DIAGNOSIS — E782 Mixed hyperlipidemia: Secondary | ICD-10-CM

## 2020-10-15 LAB — CBC
HCT: 47.4 % (ref 39.0–52.0)
Hemoglobin: 15.6 g/dL (ref 13.0–17.0)
MCH: 30.3 pg (ref 26.0–34.0)
MCHC: 32.9 g/dL (ref 30.0–36.0)
MCV: 92 fL (ref 80.0–100.0)
Platelets: 207 10*3/uL (ref 150–400)
RBC: 5.15 MIL/uL (ref 4.22–5.81)
RDW: 15 % (ref 11.5–15.5)
WBC: 7.1 10*3/uL (ref 4.0–10.5)
nRBC: 0 % (ref 0.0–0.2)

## 2020-10-15 MED ORDER — LEVOTHYROXINE SODIUM 50 MCG PO TABS
175.0000 ug | ORAL_TABLET | Freq: Every day | ORAL | Status: DC
  Administered 2020-10-16 – 2020-10-17 (×2): 175 ug via ORAL
  Filled 2020-10-15 (×2): qty 1

## 2020-10-15 MED ORDER — VITAMIN D3 25 MCG (1000 UNIT) PO TABS
5000.0000 [IU] | ORAL_TABLET | Freq: Every day | ORAL | Status: DC
  Administered 2020-10-16 – 2020-10-17 (×2): 5000 [IU] via ORAL
  Filled 2020-10-15 (×4): qty 5

## 2020-10-15 NOTE — Progress Notes (Signed)
Patient transferred to 2a from icu. Patient with amio drip currently running. No c/o pain at this time. Sleeping soundly. Call bell in reach.

## 2020-10-15 NOTE — Progress Notes (Signed)
Patient ID: Douglas Conway, male   DOB: 12-29-1955, 65 y.o.   MRN: [id number] [facility] PROGRESS NOTE  Aldie Soisson XTA:[id number] DOB: 12-23-1955 DOA: 10/13/2020 PCP: Pcp, No  HPI/Subjective: Patient feeling okay today.  Offers no complaints of chest pain or shortness of breath.  He admitted yesterday by the critical care team with a wide-complex tachycardia.  Patient started on amiodarone drip.  Objective: Vitals:   10/15/20 1100 10/15/20 1248  BP: (!) 93/47 103/82  Pulse: (!) 56 (!) 50  Resp:  19  Temp:  98 F (36.7 C)  SpO2: 100% 97%    Intake/Output Summary (Last 24 hours) at 10/15/2020 1342 Last data filed at 10/15/2020 1100 Gross per 24 hour  Intake 528.24 ml  Output 1550 ml  Net -1021.76 ml   Filed Weights   10/13/20 2257 10/14/20 0150 10/15/20 0458  Weight: 88.5 kg 84.9 kg 88.7 kg    ROS: Review of Systems  Respiratory:  Negative for shortness of breath.   Cardiovascular:  Negative for chest pain.  Gastrointestinal:  Negative for abdominal pain, nausea and vomiting.  Exam: Physical Exam HENT:     Head: Normocephalic.     Mouth/Throat:     Pharynx: No oropharyngeal exudate.  Eyes:     General: Lids are normal.     Conjunctiva/sclera: Conjunctivae normal.     Pupils: Pupils are equal, round, and reactive to light.  Cardiovascular:     Rate and Rhythm: Normal rate and regular rhythm.     Heart sounds: Normal heart sounds, S1 normal and S2 normal.  Pulmonary:     Breath sounds: No decreased breath sounds, wheezing, rhonchi or rales.  Abdominal:     Palpations: Abdomen is soft.     Tenderness: There is no abdominal tenderness.  Musculoskeletal:     Right lower leg: No swelling.     Left lower leg: No swelling.  Skin:    General: Skin is warm.     Findings: No rash.  Neurological:     Mental Status: He is alert and oriented to person, place, and time.     Data Reviewed: Basic Metabolic Panel: Recent Labs  Lab 10/13/20 2244 10/14/20 0157  10/14/20 0636  NA 140 138 138  K 3.5 3.5 3.7  CL 104 106 106  CO2 27 23 25   GLUCOSE 176* 103* 112*  BUN 31* 30* 30*  CREATININE 1.42* 1.14 1.04  CALCIUM 9.6 8.8* 9.0  MG 2.3 1.9  --   PHOS  --  4.7*  --    Liver Function Tests: Recent Labs  Lab 10/13/20 2244  AST 50*  ALT 44  ALKPHOS 81  BILITOT 0.7  PROT 7.5  ALBUMIN 4.1   CBC: Recent Labs  Lab 10/13/20 2244 10/14/20 0157 10/14/20 0636 10/15/20 0414  WBC 11.0* 9.1 8.0 7.1  NEUTROABS 4.0  --   --   --   HGB 15.4 14.9 15.6 15.6  HCT 45.9 44.3 46.5 47.4  MCV 93.3 91.5 90.6 92.0  PLT 228 200 202 207    BNP (last 3 results) Recent Labs    10/07/20 2230 10/13/20 2244  BNP 595.2* 776.4*     CBG: Recent Labs  Lab 10/14/20 0142  GLUCAP 100*    Recent Results (from the past 240 hour(s))  Resp Panel by RT-PCR (Flu A&B, Covid) Nasopharyngeal Swab     Status: None   Collection Time: 10/07/20 10:30 PM   Specimen: Nasopharyngeal Swab; Nasopharyngeal(NP) swabs in vial transport medium  Result Value Ref Range Status   SARS Coronavirus 2 by RT PCR NEGATIVE NEGATIVE Final    Comment: (NOTE) SARS-CoV-2 target nucleic acids are NOT DETECTED.  The SARS-CoV-2 RNA is generally detectable in upper respiratory specimens during the acute phase of infection. The lowest concentration of SARS-CoV-2 viral copies this assay can detect is 138 copies/mL. A negative result does not preclude SARS-Cov-2 infection and should not be used as the sole basis for treatment or other patient management decisions. A negative result may occur with  improper specimen collection/handling, submission of specimen other than nasopharyngeal swab, presence of viral mutation(s) within the areas targeted by this assay, and inadequate number of viral copies(<138 copies/mL). A negative result must be combined with clinical observations, patient history, and epidemiological information. The expected result is Negative.  Fact Sheet for Patients:   BloggerCourse.com  Fact Sheet for Healthcare Providers:  SeriousBroker.it  This test is no t yet approved or cleared by the [place] FDA and  has been authorized for detection and/or diagnosis of SARS-CoV-2 by FDA under an Emergency Use Authorization (EUA). This EUA will remain  in effect (meaning this test can be used) for the duration of the COVID-19 declaration under Section 564(b)(1) of the Act, 21 U.S.C.section 360bbb-3(b)(1), unless the authorization is terminated  or revoked sooner.       Influenza A by PCR NEGATIVE NEGATIVE Final   Influenza B by PCR NEGATIVE NEGATIVE Final    Comment: (NOTE) The Xpert Xpress SARS-CoV-2/FLU/RSV plus assay is intended as an aid in the diagnosis of influenza from Nasopharyngeal swab specimens and should not be used as a sole basis for treatment. Nasal washings and aspirates are unacceptable for Xpert Xpress SARS-CoV-2/FLU/RSV testing.  Fact Sheet for Patients: BloggerCourse.com  Fact Sheet for Healthcare Providers: SeriousBroker.it  This test is not yet approved or cleared by the [place] FDA and has been authorized for detection and/or diagnosis of SARS-CoV-2 by FDA under an Emergency Use Authorization (EUA). This EUA will remain in effect (meaning this test can be used) for the duration of the COVID-19 declaration under Section 564(b)(1) of the Act, 21 U.S.C. section 360bbb-3(b)(1), unless the authorization is terminated or revoked.  Performed at [facility], [address] [place], [place] [address]   MRSA Next Gen by PCR, Nasal     Status: Abnormal   Collection Time: 10/08/20  8:10 AM   Specimen: Nasal Mucosa; Nasal Swab  Result Value Ref Range Status   MRSA by PCR Next Gen DETECTED (A) NOT DETECTED Final    Comment: RESULT CALLED TO, READ BACK BY AND VERIFIED WITH: MARIA GODLOMAN 10/08/20 0930  SJL (NOTE) The GeneXpert MRSA Assay (FDA approved for NASAL specimens only), is one component of a comprehensive MRSA colonization surveillance program. It is not intended to diagnose MRSA infection nor to guide or monitor treatment for MRSA infections. Test performance is not FDA approved in patients less than 73 years old. Performed at [facility], [address] [place], [place] [address]   Resp Panel by RT-PCR (Flu A&B, Covid) Nasopharyngeal Swab     Status: None   Collection Time: 10/13/20 10:44 PM   Specimen: Nasopharyngeal Swab; Nasopharyngeal(NP) swabs in vial transport medium  Result Value Ref Range Status   SARS Coronavirus 2 by RT PCR NEGATIVE NEGATIVE Final    Comment: (NOTE) SARS-CoV-2 target nucleic acids are NOT DETECTED.  The SARS-CoV-2 RNA is generally detectable in upper respiratory specimens during the acute phase of infection. The lowest  concentration of SARS-CoV-2 viral copies this assay can detect is 138 copies/mL. A negative result does not preclude SARS-Cov-2 infection and should not be used as the sole basis for treatment or other patient management decisions. A negative result may occur with  improper specimen collection/handling, submission of specimen other than nasopharyngeal swab, presence of viral mutation(s) within the areas targeted by this assay, and inadequate number of viral copies(<138 copies/mL). A negative result must be combined with clinical observations, patient history, and epidemiological information. The expected result is Negative.  Fact Sheet for Patients:  BloggerCourse.com  Fact Sheet for Healthcare Providers:  SeriousBroker.it  This test is no t yet approved or cleared by the [place] FDA and  has been authorized for detection and/or diagnosis of SARS-CoV-2 by FDA under an Emergency Use Authorization (EUA). This EUA will remain  in effect (meaning this test can  be used) for the duration of the COVID-19 declaration under Section 564(b)(1) of the Act, 21 U.S.C.section 360bbb-3(b)(1), unless the authorization is terminated  or revoked sooner.       Influenza A by PCR NEGATIVE NEGATIVE Final   Influenza B by PCR NEGATIVE NEGATIVE Final    Comment: (NOTE) The Xpert Xpress SARS-CoV-2/FLU/RSV plus assay is intended as an aid in the diagnosis of influenza from Nasopharyngeal swab specimens and should not be used as a sole basis for treatment. Nasal washings and aspirates are unacceptable for Xpert Xpress SARS-CoV-2/FLU/RSV testing.  Fact Sheet for Patients: BloggerCourse.com  Fact Sheet for Healthcare Providers: SeriousBroker.it  This test is not yet approved or cleared by the [place] FDA and has been authorized for detection and/or diagnosis of SARS-CoV-2 by FDA under an Emergency Use Authorization (EUA). This EUA will remain in effect (meaning this test can be used) for the duration of the COVID-19 declaration under Section 564(b)(1) of the Act, 21 U.S.C. section 360bbb-3(b)(1), unless the authorization is terminated or revoked.  Performed at [facility], [address] [place] [place], [place] [address]      Studies: DG Chest Portable 1 View  Result Date: 10/13/2020 CLINICAL DATA:  V-tach EXAM: PORTABLE CHEST 1 VIEW COMPARISON:  10/07/2020 FINDINGS: Increased interstitial markings. No frank interstitial edema. No pleural effusion or pneumothorax. Cardiomegaly. Defibrillator pads overlying the left hemithorax. IMPRESSION: Cardiomegaly. No evidence of acute cardiopulmonary disease. Electronically Signed   By: Charline Bills M.D.   On: 10/13/2020 22:53    Scheduled Meds:  aspirin EC  81 mg Oral Daily   atorvastatin  20 mg Oral Daily   Chlorhexidine Gluconate Cloth  6 each Topical Q0600   [START ON 10/16/2020] levothyroxine  175 mcg Oral QAC breakfast   [START ON 10/16/2020]  Vitamin D-3  5,000 Units Oral Daily   Continuous Infusions:  amiodarone 30 mg/hr (10/15/20 0828)    Assessment/Plan:  Wide-complex tachycardia started on amiodarone drip.  Because of the patient's positive urine drug toxicology with methamphetamine and cocaine, cardiology does not believe he is a candidate for defibrillator placement right at this time.  They recommend palliative care consultation. Chronic systolic congestive heart failure, CAD.  Continue aspirin and atorvastatin blood pressure too low right at this time for Entresto and Toprol.  Seems euvolemic currently.  Last echocardiogram shows EF less than 20% Hyperlipidemia unspecified on atorvastatin Hypothyroidism unspecified on levothyroxine Vitamin D deficiency on oral supplementation     Code Status:     Code Status Orders  (From admission, onward)           Start  Ordered   10/14/20 0043  Full code  Continuous        10/14/20 0044           Code Status History     Date Active Date Inactive Code Status Order ID Comments User Context   10/07/2020 2338 10/09/2020 0116 Full Code [id number]  Jimmye Norman, NP ED   08/19/2019 0504 08/20/2019 1431 Full Code [id number]  [name], Vernetta Honey, MD ED      Family Communication: refused Disposition Plan: Status is: Inpatient  Dispo: The patient is from: Home              Anticipated d/c is to: Home              Patient currently on amiodarone drip for wide-complex tachycardia   Difficult to place patient.  No.  Consultants: Cardiology  Time spent: 27 minutes  Ajanee [employer]

## 2020-10-15 NOTE — Progress Notes (Signed)
Progress Note  Patient Name: Douglas Conway Date of Encounter: 10/15/2020  Primary Cardiologist: Mayford Knife  Subjective   Somnolent this morning.  Denies chest pain, dyspnea, palpitations, dizziness, presyncope, or syncope.  No WCT on telemetry.  Remains on amiodarone drip with heart rates in the mid 50s to low 60s bpm.  BP stable.  Inpatient Medications    Scheduled Meds:  aspirin EC  81 mg Oral Daily   atorvastatin  20 mg Oral Daily   Chlorhexidine Gluconate Cloth  6 each Topical Q0600   Continuous Infusions:  amiodarone 30 mg/hr (10/15/20 0828)   PRN Meds: docusate sodium, ipratropium-albuterol, morphine injection, nitroGLYCERIN, polyethylene glycol   Vital Signs    Vitals:   10/15/20 0600 10/15/20 0700 10/15/20 0800 10/15/20 0900  BP: 133/88 118/63 (!) 116/92 (!) 128/56  Pulse: (!) 58 (!) 55 (!) 46 (!) 48  Resp:      Temp:      TempSrc:      SpO2: 99% 99% 99% 99%  Weight:      Height:        Intake/Output Summary (Last 24 hours) at 10/15/2020 1007 Last data filed at 10/15/2020 0600 Gross per 24 hour  Intake 528.24 ml  Output 550 ml  Net -21.76 ml   Filed Weights   10/13/20 2257 10/14/20 0150 10/15/20 0458  Weight: 88.5 kg 84.9 kg 88.7 kg    Telemetry    Sinus with rare isolated PVCs - Personally Reviewed  ECG    No new tracings - Personally Reviewed  Physical Exam   GEN: No acute distress.   Neck: No JVD. Cardiac: RRR, no murmurs, rubs, or gallops.  Respiratory: Clear to auscultation bilaterally.  GI: Soft, nontender, non-distended.   MS: No edema; No deformity. Neuro: Somnolent.  Psych: Somnolent.  Labs    Chemistry Recent Labs  Lab 10/13/20 2244 10/14/20 0157 10/14/20 0636  NA 140 138 138  K 3.5 3.5 3.7  CL 104 106 106  CO2 27 23 25   GLUCOSE 176* 103* 112*  BUN 31* 30* 30*  CREATININE 1.42* 1.14 1.04  CALCIUM 9.6 8.8* 9.0  PROT 7.5  --   --   ALBUMIN 4.1  --   --   AST 50*  --   --   ALT 44  --   --   ALKPHOS 81  --   --    BILITOT 0.7  --   --   GFRNONAA 56* >60 >60  ANIONGAP 9 9 7      Hematology Recent Labs  Lab 10/14/20 0157 10/14/20 0636 10/15/20 0414  WBC 9.1 8.0 7.1  RBC 4.84 5.13 5.15  HGB 14.9 15.6 15.6  HCT 44.3 46.5 47.4  MCV 91.5 90.6 92.0  MCH 30.8 30.4 30.3  MCHC 33.6 33.5 32.9  RDW 14.9 14.9 15.0  PLT 200 202 207    Cardiac EnzymesNo results for input(s): TROPONINI in the last 168 hours. No results for input(s): TROPIPOC in the last 168 hours.   BNP Recent Labs  Lab 10/13/20 2244  BNP 776.4*     DDimer No results for input(s): DDIMER in the last 168 hours.   Radiology    DG Chest Portable 1 View  Result Date: 10/13/2020 IMPRESSION: Cardiomegaly. No evidence of acute cardiopulmonary disease. Electronically Signed   By: 12/14/20 M.D.   On: 10/13/2020 22:53    Cardiac Studies   2D echo 10/08/2020: 1. Left ventricular ejection fraction, by estimation, is <20%. The left  ventricle has severely decreased function. The left ventricle demonstrates  global hypokinesis. The left ventricular internal cavity size was severely  dilated. Left ventricular  diastolic parameters are indeterminate.   2. Right ventricular systolic function is severely reduced. The right  ventricular size is moderately enlarged. There is moderately elevated  pulmonary artery systolic pressure. The estimated right ventricular  systolic pressure is 48.4 mmHg.   3. Left atrial size was moderately dilated.   4. Right atrial size was severely dilated.   5. The mitral valve is normal in structure. Mild mitral valve  regurgitation.   6. The aortic valve was not well visualized. Aortic valve regurgitation  is not visualized.   7. The inferior vena cava is normal in size with <50% respiratory  variability, suggesting right atrial pressure of 8 mmHg. __________  LHC 10/08/2020: Prox RCA lesion is 20% stenosed. Prox LAD to Mid LAD lesion is 100% stenosed.   1.  Severe one-vessel coronary artery  disease with chronically occluded proximal LAD stent with faint right to left collaterals.  No obstructive disease affecting the RCA and left circumflex. 2.  Left ventricular angiography was not performed.  EF was severely reduced by echo at 15%. 3.  Right heart catheterization showed mild to moderate elevation in filling pressures, moderate to severe pulmonary hypertension and severely reduced cardiac output.   Recommendations: Suspect that the LAD stent has been occluded for a long time.  No benefit from revascularization.  Continue medical therapy. The patient's heart failure needs optimization.  Recommend management by advanced heart failure. In addition, given presumed ventricular tachycardia, recommend urgent EP evaluation for ICD/CRT. __________  2D echo 06/12/2019: 1. Left ventricular ejection fraction, by estimation, is 30 to 35%. The  left ventricle has moderately decreased function. The left ventricle  demonstrates global hypokinesis. The left ventricular internal cavity size  was moderately dilated. There is mild   left ventricular hypertrophy. Left ventricular diastolic parameters are  consistent with Grade I diastolic dysfunction (impaired relaxation).   2. Right ventricular systolic function is normal. The right ventricular  size is normal. There is moderately elevated pulmonary artery systolic  pressure.   3. Left atrial size was mildly dilated.   4. The mitral valve is normal in structure and function. Trivial mitral  valve regurgitation. No evidence of mitral stenosis.   5. The aortic valve is tricuspid. Aortic valve regurgitation is trivial.  No aortic stenosis is present.   6. Aortic dilatation noted. 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   7. The inferior vena cava is dilated in size with >50% respiratory  variability, suggesting right atrial pressure of 8 mmHg.   Patient Profile     65 y.o. 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   Assessment & Plan    1. 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  WCT: -Concerning for SVT with aberrancy versus sustained VT -Status post shock -  No further sustained WCT on telemetry -Likely in the setting of his polysubstance abuse with methamphetamine and cocaine in the context of severe underlying cardiomyopathy -Previously refused transfer to [facility] for ICD and left AMA indicating he was concerned someone would steal his cocaine/methamphetamine -In this context, he has been felt to not be a candidate for ICD at this time given ongoing polysubstance abuse -Continue IV amiodarone, as BP allows, with recommendation to transition to oral following IV load -Relative hypotension precludes addition of beta-blocker  3.  CAD involving the native coronary arteries with elevated troponin: -Chest pain-free -Minimal elevation in high-sensitivity troponin peaking at 163, which is likely continued downward trend from his recent elevated troponin at the end of June -Recent LHC on 10/08/2020 demonstrated severe one-vessel CAD with CTO of the proximal LAD stent with faint right to left collaterals with no obstructive disease affecting the LCx and RCA.  It was suspected the LAD stent had been occluded for a long time without significant benefit from revascularization with continued medical  therapy recommended -No indication for heparin drip at this time -ASA -Atorvastatin  4.  Medical nonadherence/polysubstance abuse: -Without complete cessation of polysubstance use and medication adherence patient will have a very poor outcome with premature cardiopulmonary death -Consider palliative care consult to discuss goals of care   For questions or updates, please contact [facility] Please consult [email or web address] for contact info under Cardiology/STEMI.    Signed, Eula Listen, PA-C [facility] Pager: [phone] 10/15/2020, 10:07 AM

## 2020-10-15 NOTE — Consult Note (Signed)
Consultation Note Date: 10/15/2020   Patient Name: Douglas Conway  DOB: Jun 22, 1955  MRN: [id number]  Age / Sex: 65 y.o., male  PCP: Pcp, No Referring Physician: Loletha Grayer, MD  Reason for Consultation: Establishing goals of care  HPI/Patient Profile: 65 y.o. male  with past medical history of CAD, ICM, HFrEF, HIV, hypothyroidism, HTN, HLD, polysubstance abuse admitted on 10/13/2020 with chest pain. He was recently admitted 6/29 with wide complex tachycardia and NSTEMI s/p left heart cath and coronary angiography showing proximal RCA lesion 20% stenosed and proximal LAD to mid LAD lesion 100% stenosed LVEF of 15%. He left AMA 6/30. This admission, he is again found to have wide complex tachycardia and started on amiodarone drip. He was positive for methamphetamine and cocaine and is therefore not a current candidate for ICD placement. PMT consult recommended by cardiology to discuss [place].  Clinical Assessment and Goals of Care: I have reviewed medical records including EPIC notes, labs and imaging, assessed the patient and then met with him  to discuss diagnosis prognosis, GOC, EOL wishes, disposition and options.  Patient initially sleepy and difficult to wake but eventually wakes and able to participate in conversation.  I introduced Palliative Medicine as specialized medical care for people living with serious illness. It focuses on providing relief from the symptoms and stress of a serious illness. The goal is to improve quality of life for both the patient and the family.  He tells me he has been living alone and is able to care for himself. He tells me he has a great appetite - ate two lunches today.    We discussed patient's current illness and what it means in the larger context of patient's on-going co-morbidities.  Natural disease trajectory and expectations at EOL were discussed. He tells me that his mother is a retired Therapist, sports and he has a son who  is an Therapist, sports and another is an Magazine features editor. He tells me he understands his medical diagnosis well because of his family members in the medical field. We discuss his cardiac problems - discuss he is not currently eligible for ICD placement d/t his substance abuse. He tells me he fully intends to stop all substance abuse including nicotine.   I attempted to elicit values and goals of care important to the patient.    We discuss code status - initially he tells me he is not sure and would defer to the medical team but then states he does not want DNR placed on his chart. We discuss this further. He tells me he would be okay with CPR and would be okay with ventilator support but only short tern.   Discussed with patient the importance of continued conversation with family and the medical providers regarding overall plan of care and treatment options, ensuring decisions are within the context of the patient's values and GOCs.    Discuss HCPOA - he tells me he would want his brother York Valliant to make medical decisions for him if he were ever unable.   Questions and concerns were addressed. The family was encouraged to call with questions or concerns.   Primary Decision Maker PATIENT    SUMMARY OF RECOMMENDATIONS   - patient requesting full code/full scope support at this point but does note he would not want long term ventilator support - consulted chaplain services to complete AD to name brother Remo Lipps as Chauncey Reading - patient states he fully intends to stop all substance abuse - consult to [facility]  Code Status/Advance  Care Planning: Full code  Discharge Planning: To Be Determined      Primary Diagnoses: Present on Admission: **None**   I have reviewed the medical record, interviewed the patient and family, and examined the patient. The following aspects are pertinent.  Past Medical History:  Diagnosis Date   Change in hearing, bilateral 24/49/7530   Chronic systolic congestive heart  failure ([place]) 05/14/2019   a. 06/2019 Echo: EF 30-35%, glob HK. gr1 DD. Mod elev RVSP. Midly dil LA, Triv MR/AI. Asc Ao 34m.   Coronary artery disease    a. 03/2005 Ant STEMI s/p PCI/DES to the LAD; b. 05/2010 St echo: reportedly nl.   HIV disease ([place] 06/28/2018   Hyperlipidemia LDL goal <70    Hypertension 06/28/2018   Hypothyroidism 01/07/2019   Lateral epicondylitis 01/07/2019   Noncompliance    STEMI (ST elevation myocardial infarction) ([place]    Tobacco abuse    Social History   Socioeconomic History   Marital status: Divorced    Spouse name: Not on file   Number of children: Not on file   Years of education: Not on file   Highest education level: Not on file  Occupational History   Not on file  Tobacco Use   Smoking status: Every Day    Packs/day: 1.00    Years: 42.00    Pack years: 42.00    Types: Cigarettes    Start date: 01/07/1968   Smokeless tobacco: Never   Tobacco comments:    Smoked 1 ppd for most of his adult life but now smoking ~ 1/3 ppd.  Vaping Use   Vaping Use: Never used  Substance and Sexual Activity   Alcohol use: Yes    Alcohol/week: 7.0 standard drinks    Types: 7 Cans of beer per week    Comment: 1 can/beer/daily   Drug use: Yes    Types: Amphetamines    Comment: smokes or snorts crystal meth 2-3x/wk.   Sexual activity: Not Currently    Partners: Female    Birth control/protection: Condom    Comment: given condoms 09/14/20  Other Topics Concern   Not on file  Social History Narrative   [place]  Works as a wBuilding control surveyor  Does not routinely exercise.   Social Determinants of Health   Financial Resource Strain: Not on file  Food Insecurity: Not on file  Transportation Needs: Not on file  Physical Activity: Not on file  Stress: Not on file  Social Connections: Not on file   History reviewed. No pertinent family history. Scheduled Meds:  aspirin EC  81 mg Oral Daily   atorvastatin  20 mg Oral Daily   Chlorhexidine  Gluconate Cloth  6 each Topical Q0600   [START ON 10/16/2020] cholecalciferol  5,000 Units Oral Daily   [START ON 10/16/2020] levothyroxine  175 mcg Oral QAC breakfast   Continuous Infusions:  amiodarone 30 mg/hr (10/15/20 0828)   PRN Meds:.docusate sodium, ipratropium-albuterol, morphine injection, nitroGLYCERIN, polyethylene glycol Allergies  Allergen Reactions   Plavix [Clopidogrel Bisulfate] Rash   Review of Systems  Constitutional:  Positive for fatigue.  Respiratory:  Negative for chest tightness and shortness of breath.   Cardiovascular:  Negative for chest pain.   Physical Exam Constitutional:      Comments: Initially lethargic but eventually wakes and able to carry on conversation  Pulmonary:     Effort: Pulmonary effort is normal.  Skin:    General: Skin is warm and dry.  Neurological:  Mental Status: He is oriented to person, place, and time.  Psychiatric:        Mood and Affect: Mood normal.    Vital Signs: BP 103/82 (BP Location: Right Arm)   Pulse (!) 50   Temp 98 F (36.7 C) (Oral)   Resp 19   Ht _0  (1.854 m)   Wt 88.7 kg   SpO2 97%   BMI 25.80 kg/m  Pain Scale: 0-10   Pain Score: 0-No pain   SpO2: SpO2: 97 % O2 Device:SpO2: 97 % O2 Flow Rate: .O2 Flow Rate (L/min): 2 L/min  IO: Intake/output summary:  Intake/Output Summary (Last 24 hours) at 10/15/2020 1457 Last data filed at 10/15/2020 1340 Gross per 24 hour  Intake 768.24 ml  Output 1000 ml  Net -231.76 ml    LBM: Last BM Date:  (PTA) Baseline Weight: Weight: 88.5 kg Most recent weight: Weight: 88.7 kg     Palliative Assessment/Data: PPS 60%    Time Total: 60 minutes Greater than 50%  of this time was spent counseling and coordinating care related to the above assessment and plan.  Juel Burrow, DNP, AGNP-C Palliative Medicine Team [phone] Pager: [phone]

## 2020-10-16 MED ORDER — ENOXAPARIN SODIUM 40 MG/0.4ML IJ SOSY
40.0000 mg | PREFILLED_SYRINGE | INTRAMUSCULAR | Status: DC
  Administered 2020-10-16: 40 mg via SUBCUTANEOUS
  Filled 2020-10-16: qty 0.4

## 2020-10-16 MED ORDER — ASPIRIN 81 MG PO TBEC: 81.0000 mg | DELAYED_RELEASE_TABLET | Freq: Every day | ORAL | 0 refills | Status: DC

## 2020-10-16 MED ORDER — POLYETHYLENE GLYCOL 3350 17 G PO PACK: 17.0000 g | PACK | Freq: Every day | ORAL | 0 refills | Status: DC | PRN

## 2020-10-16 MED ORDER — ATORVASTATIN CALCIUM 20 MG PO TABS: 20.0000 mg | ORAL_TABLET | Freq: Every day | ORAL | 0 refills | Status: DC

## 2020-10-16 MED ORDER — AMIODARONE HCL 200 MG PO TABS: ORAL_TABLET | ORAL | 0 refills | Status: DC

## 2020-10-16 MED ORDER — AMIODARONE HCL 200 MG PO TABS
400.0000 mg | ORAL_TABLET | Freq: Two times a day (BID) | ORAL | Status: DC
  Administered 2020-10-16 – 2020-10-17 (×3): 400 mg via ORAL
  Filled 2020-10-16 (×3): qty 2

## 2020-10-16 MED ORDER — LOSARTAN POTASSIUM 25 MG PO TABS
12.5000 mg | ORAL_TABLET | Freq: Every day | ORAL | Status: DC
  Administered 2020-10-16 – 2020-10-17 (×2): 12.5 mg via ORAL
  Filled 2020-10-16 (×2): qty 1

## 2020-10-16 NOTE — TOC Initial Note (Signed)
Transition of Care [facility]) - Initial/Assessment Note    Patient Details  Name: Douglas Conway MRN: [id number] Date of Birth: 07/20/1955  [facility]) CM/SW Contact:    Alberteen Sam, LCSW Phone Number: 10/16/2020, 1:41 PM  Clinical Narrative:                  CSW met with patient at bedside for substance abuse consult. Patient reports he lives home alone and recently used Meth 1 week ago. Reports he has decided to longer do drugs, but was agreeable for CSW to provide substance abuse resource list at bedside.     Expected Discharge Plan: Home/Self Care Barriers to Discharge: Continued Medical Work up   Patient Goals and CMS Choice   CMS Medicare.gov Compare Post Acute Care list provided to:: Patient Choice offered to / list presented to : Patient  Expected Discharge Plan and Services Expected Discharge Plan: Home/Self Care                                              Prior Living Arrangements/Services   Lives with:: Self   Do you feel safe going back to the place where you live?: Yes               Activities of Daily Living Home Assistive Devices/Equipment: None ADL Screening (condition at time of admission) Patient's cognitive ability adequate to safely complete daily activities?: Yes Is the patient deaf or have difficulty hearing?: No Does the patient have difficulty seeing, even when wearing glasses/contacts?: No Does the patient have difficulty concentrating, remembering, or making decisions?: No Patient able to express need for assistance with ADLs?: No Independently performs ADLs?: Yes (appropriate for developmental age) Does the patient have difficulty walking or climbing stairs?: No Weakness of Legs: None Weakness of Arms/Hands: None  Permission Sought/Granted                  Emotional Assessment       Orientation: : Oriented to Self, Oriented to Place, Oriented to  Time, Oriented to Situation Alcohol / Substance Use: Not  Applicable Psych Involvement: No (comment)  Admission diagnosis:  Ventricular tachyarrhythmia ([place]) [I47.2] Wide-complex tachycardia ([place]) [I47.2] NSTEMI (non-ST elevated myocardial infarction) ([place]) [I21.4] Patient Active Problem List   Diagnosis Date Noted   Hyperlipidemia    Vitamin D deficiency    Wide-complex tachycardia ([place])    HFrEF (heart failure with reduced ejection fraction) ([place])    Elevated troponin    Unstable angina ([place])    Acute on chronic systolic heart failure ([place])    Ventricular tachyarrhythmia ([place]) 10/07/2020   Shortness of breath 09/14/2020   Pain and swelling of knee, right 09/14/2020   Sciatica of left side 97/41/6384   Chronic systolic CHF (congestive heart failure) ([place]) 05/14/2019   Hypothyroidism 01/07/2019   HIV disease ([place]) 06/28/2018   Change in hearing, bilateral 06/28/2018   Hypertension 06/28/2018   Healthcare maintenance 06/28/2018   PCP:  [facility]:   [facility] [address] [place] [address] Phone: [phone] Fax: [facility] [address] [place] [address] Phone: [phone] Fax: [phone]     Social Determinants of Health (SDOH) Interventions    Readmission Risk Interventions No flowsheet data found.

## 2020-10-16 NOTE — Progress Notes (Signed)
   10/16/20 0700  Clinical Encounter Type  Visited With Patient  Visit Type Follow-up  Referral From Nurse  Consult/Referral To Chaplain  Spiritual Encounters  Spiritual Needs Brochure  Chaplain Cecilio Ohlrich responded to an OR for an AD for room 246A Pt, Douglas Conway. Chaplain Latanya Hemmer attempted to visited pt yesterday. Pt was resting and I told him I would return. Chaplain Jazell Rosenau checked in on pt at 6:40 am, pt was asleep. I returned at 7:45am and pt was awake. I introduced myself and informed him I was there at the request of an Advance Directive. I asked him did he know what is was and if he would like to complete one. Pt waved his hand at me and said, "yes I know what it is and no I don't want it, I don't want it. I said ok thank you and you have a nice day hope you continue to heal. God Bless.

## 2020-10-16 NOTE — Consult Note (Signed)
   Heart Failure Nurse Navigator Note  HFrEF 20%, previously reported at 30 to 35% in 2021.  Have attempted to visit with this patient on 3 separate occasions, each time he falls back to sleep.  Left heart failure teaching materials at the bedside.  Spoke with LifeVest rep.  She states that they have the records and order that they need.  They are just awaiting Medicaid approval.  She feels that he will not be fitted with the vest until tomorrow morning, October 17, 2020.  Made charge nurse [facility] aware.  Tresa Endo RN CHFN

## 2020-10-16 NOTE — Progress Notes (Signed)
   Order placed for LifeVest. Rep has been contacted.

## 2020-10-16 NOTE — Progress Notes (Signed)
Patient ID: Douglas Conway, male   DOB: 16-Mar-1956, 65 y.o.   MRN: [id number] [facility] PROGRESS NOTE  Briam Knuckles LZJ:[id number] DOB: [birth year] DOA: 10/13/2020 PCP: Pcp, No  HPI/Subjective: Patient feeling okay.  Offers no complaints.  States he is going to stop using drugs.  No chest pain.  No shortness of breath.  Admitted with wide-complex tachycardia.  Objective: Vitals:   10/16/20 0726 10/16/20 1112  BP: 123/87 (!) 141/85  Pulse: (!) 49 (!) 55  Resp: 18 16  Temp: 97.6 F (36.4 C) 97.7 F (36.5 C)  SpO2: 93% 92%    Intake/Output Summary (Last 24 hours) at 10/16/2020 1424 Last data filed at 10/16/2020 1345 Gross per 24 hour  Intake 600 ml  Output --  Net 600 ml   Filed Weights   10/14/20 0150 10/15/20 0458 10/16/20 0500  Weight: 84.9 kg 88.7 kg 86.8 kg    ROS: Review of Systems  Respiratory:  Negative for shortness of breath.   Cardiovascular:  Negative for chest pain.  Gastrointestinal:  Negative for abdominal pain, nausea and vomiting.  Exam: Physical Exam HENT:     Head: Normocephalic.     Mouth/Throat:     Pharynx: No oropharyngeal exudate.  Eyes:     General: Lids are normal.     Conjunctiva/sclera: Conjunctivae normal.     Pupils: Pupils are equal, round, and reactive to light.  Cardiovascular:     Rate and Rhythm: Normal rate and regular rhythm.     Heart sounds: Normal heart sounds, S1 normal and S2 normal.  Pulmonary:     Breath sounds: No decreased breath sounds, wheezing, rhonchi or rales.  Abdominal:     Palpations: Abdomen is soft.     Tenderness: There is no abdominal tenderness.  Musculoskeletal:     Right ankle: No swelling.     Left ankle: No swelling.  Skin:    General: Skin is warm.     Findings: No rash.  Neurological:     Mental Status: He is alert and oriented to person, place, and time.     Data Reviewed: Basic Metabolic Panel: Recent Labs  Lab 10/13/20 2244 10/14/20 0157 10/14/20 0636  NA 140 138 138  K 3.5 3.5 3.7  CL  104 106 106  CO2 27 23 25   GLUCOSE 176* 103* 112*  BUN 31* 30* 30*  CREATININE 1.42* 1.14 1.04  CALCIUM 9.6 8.8* 9.0  MG 2.3 1.9  --   PHOS  --  4.7*  --    Liver Function Tests: Recent Labs  Lab 10/13/20 2244  AST 50*  ALT 44  ALKPHOS 81  BILITOT 0.7  PROT 7.5  ALBUMIN 4.1   CBC: Recent Labs  Lab 10/13/20 2244 10/14/20 0157 10/14/20 0636 10/15/20 0414  WBC 11.0* 9.1 8.0 7.1  NEUTROABS 4.0  --   --   --   HGB 15.4 14.9 15.6 15.6  HCT 45.9 44.3 46.5 47.4  MCV 93.3 91.5 90.6 92.0  PLT 228 200 202 207    BNP (last 3 results) Recent Labs    10/07/20 2230 10/13/20 2244  BNP 595.2* 776.4*     CBG: Recent Labs  Lab 10/14/20 0142  GLUCAP 100*    Recent Results (from the past 240 hour(s))  Resp Panel by RT-PCR (Flu A&B, Covid) Nasopharyngeal Swab     Status: None   Collection Time: 10/07/20 10:30 PM   Specimen: Nasopharyngeal Swab; Nasopharyngeal(NP) swabs in vial transport medium  Result Value Ref Range  Status   SARS Coronavirus 2 by RT PCR NEGATIVE NEGATIVE Final    Comment: (NOTE) SARS-CoV-2 target nucleic acids are NOT DETECTED.  The SARS-CoV-2 RNA is generally detectable in upper respiratory specimens during the acute phase of infection. The lowest concentration of SARS-CoV-2 viral copies this assay can detect is 138 copies/mL. A negative result does not preclude SARS-Cov-2 infection and should not be used as the sole basis for treatment or other patient management decisions. A negative result may occur with  improper specimen collection/handling, submission of specimen other than nasopharyngeal swab, presence of viral mutation(s) within the areas targeted by this assay, and inadequate number of viral copies(<138 copies/mL). A negative result must be combined with clinical observations, patient history, and epidemiological information. The expected result is Negative.  Fact Sheet for Patients:  BloggerCourse.com  Fact  Sheet for Healthcare Providers:  SeriousBroker.it  This test is no t yet approved or cleared by the [place] FDA and  has been authorized for detection and/or diagnosis of SARS-CoV-2 by FDA under an Emergency Use Authorization (EUA). This EUA will remain  in effect (meaning this test can be used) for the duration of the COVID-19 declaration under Section 564(b)(1) of the Act, 21 U.S.C.section 360bbb-3(b)(1), unless the authorization is terminated  or revoked sooner.       Influenza A by PCR NEGATIVE NEGATIVE Final   Influenza B by PCR NEGATIVE NEGATIVE Final    Comment: (NOTE) The Xpert Xpress SARS-CoV-2/FLU/RSV plus assay is intended as an aid in the diagnosis of influenza from Nasopharyngeal swab specimens and should not be used as a sole basis for treatment. Nasal washings and aspirates are unacceptable for Xpert Xpress SARS-CoV-2/FLU/RSV testing.  Fact Sheet for Patients: BloggerCourse.com  Fact Sheet for Healthcare Providers: SeriousBroker.it  This test is not yet approved or cleared by the [place] FDA and has been authorized for detection and/or diagnosis of SARS-CoV-2 by FDA under an Emergency Use Authorization (EUA). This EUA will remain in effect (meaning this test can be used) for the duration of the COVID-19 declaration under Section 564(b)(1) of the Act, 21 U.S.C. section 360bbb-3(b)(1), unless the authorization is terminated or revoked.  Performed at [facility], [address] [place], [place] [address]   MRSA Next Gen by PCR, Nasal     Status: Abnormal   Collection Time: 10/08/20  8:10 AM   Specimen: Nasal Mucosa; Nasal Swab  Result Value Ref Range Status   MRSA by PCR Next Gen DETECTED (A) NOT DETECTED Final    Comment: RESULT CALLED TO, READ BACK BY AND VERIFIED WITH: MARIA GODLOMAN 10/08/20 0930 SJ[facility] (NOTE) The GeneXpert MRSA Assay (FDA approved for NASAL  specimens only), is one component of a comprehensive MRSA colonization surveillance program. It is not intended to diagnose MRSA infection nor to guide or monitor treatment for MRSA infections. Test performance is not FDA approved in patients less than 90 years old. Performed at [facility], [address] [place], [place] [address]   Resp Panel by RT-PCR (Flu A&B, Covid) Nasopharyngeal Swab     Status: None   Collection Time: 10/13/20 10:44 PM   Specimen: Nasopharyngeal Swab; Nasopharyngeal(NP) swabs in vial transport medium  Result Value Ref Range Status   SARS Coronavirus 2 by RT PCR NEGATIVE NEGATIVE Final    Comment: (NOTE) SARS-CoV-2 target nucleic acids are NOT DETECTED.  The SARS-CoV-2 RNA is generally detectable in upper respiratory specimens during the acute phase of infection. The lowest concentration of SARS-CoV-2 viral  copies this assay can detect is 138 copies/mL. A negative result does not preclude SARS-Cov-2 infection and should not be used as the sole basis for treatment or other patient management decisions. A negative result may occur with  improper specimen collection/handling, submission of specimen other than nasopharyngeal swab, presence of viral mutation(s) within the areas targeted by this assay, and inadequate number of viral copies(<138 copies/mL). A negative result must be combined with clinical observations, patient history, and epidemiological information. The expected result is Negative.  Fact Sheet for Patients:  BloggerCourse.com  Fact Sheet for Healthcare Providers:  SeriousBroker.it  This test is no t yet approved or cleared by the [place] FDA and  has been authorized for detection and/or diagnosis of SARS-CoV-2 by FDA under an Emergency Use Authorization (EUA). This EUA will remain  in effect (meaning this test can be used) for the duration of the COVID-19 declaration under  Section 564(b)(1) of the Act, 21 U.S.C.section 360bbb-3(b)(1), unless the authorization is terminated  or revoked sooner.       Influenza A by PCR NEGATIVE NEGATIVE Final   Influenza B by PCR NEGATIVE NEGATIVE Final    Comment: (NOTE) The Xpert Xpress SARS-CoV-2/FLU/RSV plus assay is intended as an aid in the diagnosis of influenza from Nasopharyngeal swab specimens and should not be used as a sole basis for treatment. Nasal washings and aspirates are unacceptable for Xpert Xpress SARS-CoV-2/FLU/RSV testing.  Fact Sheet for Patients: BloggerCourse.com  Fact Sheet for Healthcare Providers: SeriousBroker.it  This test is not yet approved or cleared by the [place] FDA and has been authorized for detection and/or diagnosis of SARS-CoV-2 by FDA under an Emergency Use Authorization (EUA). This EUA will remain in effect (meaning this test can be used) for the duration of the COVID-19 declaration under Section 564(b)(1) of the Act, 21 U.S.C. section 360bbb-3(b)(1), unless the authorization is terminated or revoked.  Performed at [facility], [address] [place], [place] [address]       Scheduled Meds:  amiodarone  400 mg Oral BID   aspirin EC  81 mg Oral Daily   atorvastatin  20 mg Oral Daily   Chlorhexidine Gluconate Cloth  6 each Topical Q0600   cholecalciferol  5,000 Units Oral Daily   enoxaparin (LOVENOX) injection  40 mg Subcutaneous Q24H   levothyroxine  175 mcg Oral QAC breakfast   losartan  12.5 mg Oral Daily     Assessment/Plan:  Wide-complex tachycardia.  Patient was admitted and started on amiodarone drip.  Now on oral amiodarone.  With the patient's positive urine drug toxicology with methamphetamine and cocaine, cardiology does not want to place an ICD at this time.  A LifeVest was ordered.  If LifeVest obtained, may be able to go home tomorrow morning. Chronic systolic congestive heart  failure with CAD.  Continue aspirin, atorvastatin.  Blood pressure too low for Toprol and Entresto at this time.  Cardiology trying to add low-dose losartan.  Last echocardiogram shows an EF less than 20%. Hyperlipidemia unspecified on atorvastatin Hypothyroidism unspecified on levothyroxine Vitamin D deficiency on oral supplementation      Code Status:     Code Status Orders  (From admission, onward)           Start     Ordered   10/14/20 0043  Full code  Continuous        10/14/20 0044           Code Status History     Date  Active Date Inactive Code Status Order ID Comments User Context   10/07/2020 2338 10/09/2020 0116 Full Code [id number]  Jimmye Norman, NP ED   08/19/2019 0504 08/20/2019 1431 Full Code [id number]  [name], Vernetta Honey, MD ED      Family Communication: Refused Disposition Plan: Status is: Inpatient  Dispo: The patient is from: Home              Anticipated d/c is to: Home              Patient currently doing better than when he came in.  Cardiology setting up for a LifeVest to go home with.   Difficult to place patient.  No.  Consultants: Cardiology  Time spent: 27 minutes  Zachrey [employer]

## 2020-10-16 NOTE — Progress Notes (Addendum)
Progress Note  Patient Name: Douglas Conway Date of Encounter: 10/16/2020  Primary Cardiologist: Mayford Knife  Subjective   Transferred out of the ICU yesterday. Pulled out IV, uncertain circumstances. No chest pain, dyspnea, palpitations, dizziness, presyncope, or syncope. Good appetite. Reports he "done with drugs."  Inpatient Medications    Scheduled Meds:  aspirin EC  81 mg Oral Daily   atorvastatin  20 mg Oral Daily   Chlorhexidine Gluconate Cloth  6 each Topical Q0600   cholecalciferol  5,000 Units Oral Daily   levothyroxine  175 mcg Oral QAC breakfast   Continuous Infusions:  amiodarone 30 mg/hr (10/15/20 2047)   PRN Meds: docusate sodium, ipratropium-albuterol, morphine injection, nitroGLYCERIN, polyethylene glycol   Vital Signs    Vitals:   10/15/20 1603 10/15/20 2311 10/16/20 0500 10/16/20 0647  BP: 132/78 108/85  (!) 147/97  Pulse: (!) 56 (!) 56  (!) 54  Resp: 19 18  18   Temp: 97.6 F (36.4 C) 98.3 F (36.8 C)  97.7 F (36.5 C)  TempSrc: Oral Oral  Oral  SpO2: 97% 96%  97%  Weight:   86.8 kg   Height:        Intake/Output Summary (Last 24 hours) at 10/16/2020 0724 Last data filed at 10/15/2020 1830 Gross per 24 hour  Intake 480 ml  Output 1000 ml  Net -520 ml    Filed Weights   10/14/20 0150 10/15/20 0458 10/16/20 0500  Weight: 84.9 kg 88.7 kg 86.8 kg    Telemetry    Sinus with rare isolated PVCs - Personally Reviewed  ECG    No new tracings - Personally Reviewed  Physical Exam   GEN: No acute distress.   Neck: No JVD. Cardiac: RRR, no murmurs, rubs, or gallops.  Respiratory: Clear to auscultation bilaterally.  GI: Soft, nontender, non-distended.   MS: No edema; No deformity. Neuro: Somnolent.  Psych: Somnolent.  Labs    Chemistry Recent Labs  Lab 10/13/20 2244 10/14/20 0157 10/14/20 0636  NA 140 138 138  K 3.5 3.5 3.7  CL 104 106 106  CO2 27 23 25   GLUCOSE 176* 103* 112*  BUN 31* 30* 30*  CREATININE 1.42* 1.14 1.04  CALCIUM  9.6 8.8* 9.0  PROT 7.5  --   --   ALBUMIN 4.1  --   --   AST 50*  --   --   ALT 44  --   --   ALKPHOS 81  --   --   BILITOT 0.7  --   --   GFRNONAA 56* >60 >60  ANIONGAP 9 9 7       Hematology Recent Labs  Lab 10/14/20 0157 10/14/20 0636 10/15/20 0414  WBC 9.1 8.0 7.1  RBC 4.84 5.13 5.15  HGB 14.9 15.6 15.6  HCT 44.3 46.5 47.4  MCV 91.5 90.6 92.0  MCH 30.8 30.4 30.3  MCHC 33.6 33.5 32.9  RDW 14.9 14.9 15.0  PLT 200 202 207     Cardiac EnzymesNo results for input(s): TROPONINI in the last 168 hours. No results for input(s): TROPIPOC in the last 168 hours.   BNP Recent Labs  Lab 10/13/20 2244  BNP 776.4*      DDimer No results for input(s): DDIMER in the last 168 hours.   Radiology    DG Chest Portable 1 View  Result Date: 10/13/2020 IMPRESSION: Cardiomegaly. No evidence of acute cardiopulmonary disease. Electronically Signed   By: 12/16/20 M.D.   On: 10/13/2020 22:53  Cardiac Studies   2D echo 10/08/2020: 1. Left ventricular ejection fraction, by estimation, is <20%. The left  ventricle has severely decreased function. The left ventricle demonstrates  global hypokinesis. The left ventricular internal cavity size was severely  dilated. Left ventricular  diastolic parameters are indeterminate.   2. Right ventricular systolic function is severely reduced. The right  ventricular size is moderately enlarged. There is moderately elevated  pulmonary artery systolic pressure. The estimated right ventricular  systolic pressure is 48.4 mmHg.   3. Left atrial size was moderately dilated.   4. Right atrial size was severely dilated.   5. The mitral valve is normal in structure. Mild mitral valve  regurgitation.   6. The aortic valve was not well visualized. Aortic valve regurgitation  is not visualized.   7. The inferior vena cava is normal in size with <50% respiratory  variability, suggesting right atrial pressure of 8 mmHg. __________  LHC  10/08/2020: Prox RCA lesion is 20% stenosed. Prox LAD to Mid LAD lesion is 100% stenosed.   1.  Severe one-vessel coronary artery disease with chronically occluded proximal LAD stent with faint right to left collaterals.  No obstructive disease affecting the RCA and left circumflex. 2.  Left ventricular angiography was not performed.  EF was severely reduced by echo at 15%. 3.  Right heart catheterization showed mild to moderate elevation in filling pressures, moderate to severe pulmonary hypertension and severely reduced cardiac output.   Recommendations: Suspect that the LAD stent has been occluded for a long time.  No benefit from revascularization.  Continue medical therapy. The patient's heart failure needs optimization.  Recommend management by advanced heart failure. In addition, given presumed ventricular tachycardia, recommend urgent EP evaluation for ICD/CRT. __________  2D echo 06/12/2019: 1. Left ventricular ejection fraction, by estimation, is 30 to 35%. The  left ventricle has moderately decreased function. The left ventricle  demonstrates global hypokinesis. The left ventricular internal cavity size  was moderately dilated. There is mild   left ventricular hypertrophy. Left ventricular diastolic parameters are  consistent with Grade I diastolic dysfunction (impaired relaxation).   2. Right ventricular systolic function is normal. The right ventricular  size is normal. There is moderately elevated pulmonary artery systolic  pressure.   3. Left atrial size was mildly dilated.   4. The mitral valve is normal in structure and function. Trivial mitral  valve regurgitation. No evidence of mitral stenosis.   5. The aortic valve is tricuspid. Aortic valve regurgitation is trivial.  No aortic stenosis is present.   6. Aortic dilatation noted. 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   7. The inferior vena cava is dilated in size with >50% respiratory   variability, suggesting right atrial pressure of 8 mmHg.   Patient Profile     65 y.o. 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   Assessment & Plan    1. 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  WCT: -Concerning for SVT with aberrancy versus sustained VT -Status post shock -No further sustained WCT on telemetry -Likely in the setting of his polysubstance abuse with methamphetamine and cocaine in the context of severe underlying cardiomyopathy -Previously refused transfer to [facility] for ICD and left AMA indicating he was concerned someone would steal his cocaine/methamphetamine -In this context, he has been felt to not be a candidate for ICD at this time given ongoing polysubstance abuse, medical noncompliance, and high risk for device and lead infection -Will discuss with MD the possibility of LifeVest at discharge  -Given he has pulled out his IV, we will transition him to oral amiodarone 400 mg bid this morning which should be continued for 1 week, followed by 200 mg bid x 1 week, then 200 mg daily thereafter  -Relative hypotension and asymptomatic bradycardia preclude addition of beta-blocker  3.  CAD involving the native coronary arteries with elevated troponin: -Chest pain-free -Minimal elevation in  high-sensitivity troponin peaking at 163, which is likely continued downward trend from his recent elevated troponin at the end of June -Recent LHC on 10/08/2020 demonstrated severe one-vessel CAD with CTO of the proximal LAD stent with faint right to left collaterals with no obstructive disease affecting the LCx and RCA.  It was suspected the LAD stent had been occluded for a long time without significant benefit from revascularization with continued medical therapy recommended -No indication for heparin drip at this time -ASA -Atorvastatin  4.  Medical nonadherence/polysubstance abuse: -Pulled out IV -Prior admission, left AMA -Without complete cessation of polysubstance use and medication adherence patient will have a very poor outcome with premature cardiopulmonary death -Consider palliative care consult to discuss goals of care  5. Sleep disordered breathing: -Significant snoring noted on the floor  -Contributing to underlying bradycardia -Needs outpatient sleep study    For questions or updates, please contact [facility] Please consult [email or web address] for contact info under Cardiology/STEMI.    Signed, Eula Listen, PA-C [facility] Pager: [phone] 10/16/2020, 7:24 AM

## 2020-10-17 ENCOUNTER — Other Ambulatory Visit: Payer: Self-pay

## 2020-10-17 ENCOUNTER — Other Ambulatory Visit: Payer: Self-pay | Admitting: Internal Medicine

## 2020-10-17 DIAGNOSIS — B20 Human immunodeficiency virus [HIV] disease: Secondary | ICD-10-CM

## 2020-10-17 DIAGNOSIS — I214 Non-ST elevation (NSTEMI) myocardial infarction: Secondary | ICD-10-CM

## 2020-10-17 LAB — BASIC METABOLIC PANEL
Anion gap: 6 (ref 5–15)
BUN: 22 mg/dL (ref 8–23)
CO2: 27 mmol/L (ref 22–32)
Calcium: 9.2 mg/dL (ref 8.9–10.3)
Chloride: 103 mmol/L (ref 98–111)
Creatinine, Ser: 1 mg/dL (ref 0.61–1.24)
GFR, Estimated: >60 mL/min (ref >60)
Glucose, Bld: 99 mg/dL (ref 70–99)
Potassium: 4.3 mmol/L (ref 3.5–5.1)
Sodium: 136 mmol/L (ref 135–145)

## 2020-10-17 MED ORDER — LEVOTHYROXINE SODIUM 175 MCG PO TABS: 175.0000 ug | ORAL_TABLET | Freq: Every day | ORAL | 1 refills | Status: DC

## 2020-10-17 MED ORDER — LOSARTAN POTASSIUM 25 MG PO TABS: 12.5000 mg | ORAL_TABLET | Freq: Every day | ORAL | 0 refills | Status: DC

## 2020-10-17 NOTE — Progress Notes (Signed)
Progress Note  Patient Name: Douglas Conway Date of Encounter: 10/17/2020  Primary Cardiologist: Mayford Knife  Subjective   No chest pain, dyspnea, palpitations, dizziness, presyncope, or syncope. No ventricular arrhythmias on tele. Continues to report he is "done with drugs." LifeVest indicated on 7/8, they would come by today to fit him for a LifeVest.   Inpatient Medications    Scheduled Meds:  amiodarone  400 mg Oral BID   aspirin EC  81 mg Oral Daily   atorvastatin  20 mg Oral Daily   Chlorhexidine Gluconate Cloth  6 each Topical Q0600   cholecalciferol  5,000 Units Oral Daily   enoxaparin (LOVENOX) injection  40 mg Subcutaneous Q24H   levothyroxine  175 mcg Oral QAC breakfast   losartan  12.5 mg Oral Daily   Continuous Infusions:   PRN Meds: docusate sodium, ipratropium-albuterol, morphine injection, nitroGLYCERIN, polyethylene glycol   Vital Signs    Vitals:   10/16/20 1112 10/16/20 1556 10/16/20 1911 10/17/20 0538  BP: (!) 141/85 (!) 132/97 133/89 116/73  Pulse: (!) 55 (!) 55 (!) 59 63  Resp: 16 17 18 18   Temp: 97.7 F (36.5 C) 97.9 F (36.6 C) (!) 97.4 F (36.3 C) 98.1 F (36.7 C)  TempSrc: Oral Oral Oral Oral  SpO2: 92% 95% 94% 96%  Weight:      Height:        Intake/Output Summary (Last 24 hours) at 10/17/2020 0744 Last data filed at 10/16/2020 2300 Gross per 24 hour  Intake 1080 ml  Output 600 ml  Net 480 ml    Filed Weights   10/14/20 0150 10/15/20 0458 10/16/20 0500  Weight: 84.9 kg 88.7 kg 86.8 kg    Telemetry    Sinus with rare isolated PVCs - Personally Reviewed  ECG    No new tracings - Personally Reviewed  Physical Exam   GEN: No acute distress.   Neck: No JVD. Cardiac: RRR, no murmurs, rubs, or gallops.  Respiratory: Clear to auscultation bilaterally.  GI: Soft, nontender, non-distended.   MS: No edema; No deformity. Neuro: Somnolent.  Psych: Somnolent.  Labs    Chemistry Recent Labs  Lab 10/13/20 2244 10/14/20 0157  10/14/20 0636 10/17/20 0456  NA 140 138 138 136  K 3.5 3.5 3.7 4.3  CL 104 106 106 103  CO2 27 23 25 27   GLUCOSE 176* 103* 112* 99  BUN 31* 30* 30* 22  CREATININE 1.42* 1.14 1.04 1.00  CALCIUM 9.6 8.8* 9.0 9.2  PROT 7.5  --   --   --   ALBUMIN 4.1  --   --   --   AST 50*  --   --   --   ALT 44  --   --   --   ALKPHOS 81  --   --   --   BILITOT 0.7  --   --   --   GFRNONAA 56* >60 >60 >60  ANIONGAP 9 9 7 6       Hematology Recent Labs  Lab 10/14/20 0157 10/14/20 0636 10/15/20 0414  WBC 9.1 8.0 7.1  RBC 4.84 5.13 5.15  HGB 14.9 15.6 15.6  HCT 44.3 46.5 47.4  MCV 91.5 90.6 92.0  MCH 30.8 30.4 30.3  MCHC 33.6 33.5 32.9  RDW 14.9 14.9 15.0  PLT 200 202 207     Cardiac EnzymesNo results for input(s): TROPONINI in the last 168 hours. No results for input(s): TROPIPOC in the last 168 hours.   BNP  Recent Labs  Lab 10/13/20 2244  BNP 776.4*      DDimer No results for input(s): DDIMER in the last 168 hours.   Radiology    DG Chest Portable 1 View  Result Date: 10/13/2020 IMPRESSION: Cardiomegaly. No evidence of acute cardiopulmonary disease. Electronically Signed   By: Charline Bills M.D.   On: 10/13/2020 22:53    Cardiac Studies   2D echo 10/08/2020: 1. Left ventricular ejection fraction, by estimation, is <20%. The left  ventricle has severely decreased function. The left ventricle demonstrates  global hypokinesis. The left ventricular internal cavity size was severely  dilated. Left ventricular  diastolic parameters are indeterminate.   2. Right ventricular systolic function is severely reduced. The right  ventricular size is moderately enlarged. There is moderately elevated  pulmonary artery systolic pressure. The estimated right ventricular  systolic pressure is 48.4 mmHg.   3. Left atrial size was moderately dilated.   4. Right atrial size was severely dilated.   5. The mitral valve is normal in structure. Mild mitral valve  regurgitation.   6. The  aortic valve was not well visualized. Aortic valve regurgitation  is not visualized.   7. The inferior vena cava is normal in size with <50% respiratory  variability, suggesting right atrial pressure of 8 mmHg. __________  LHC 10/08/2020: Prox RCA lesion is 20% stenosed. Prox LAD to Mid LAD lesion is 100% stenosed.   1.  Severe one-vessel coronary artery disease with chronically occluded proximal LAD stent with faint right to left collaterals.  No obstructive disease affecting the RCA and left circumflex. 2.  Left ventricular angiography was not performed.  EF was severely reduced by echo at 15%. 3.  Right heart catheterization showed mild to moderate elevation in filling pressures, moderate to severe pulmonary hypertension and severely reduced cardiac output.   Recommendations: Suspect that the LAD stent has been occluded for a long time.  No benefit from revascularization.  Continue medical therapy. The patient's heart failure needs optimization.  Recommend management by advanced heart failure. In addition, given presumed ventricular tachycardia, recommend urgent EP evaluation for ICD/CRT. __________  2D echo 06/12/2019: 1. Left ventricular ejection fraction, by estimation, is 30 to 35%. The  left ventricle has moderately decreased function. The left ventricle  demonstrates global hypokinesis. The left ventricular internal cavity size  was moderately dilated. There is mild   left ventricular hypertrophy. Left ventricular diastolic parameters are  consistent with Grade I diastolic dysfunction (impaired relaxation).   2. Right ventricular systolic function is normal. The right ventricular  size is normal. There is moderately elevated pulmonary artery systolic  pressure.   3. Left atrial size was mildly dilated.   4. The mitral valve is normal in structure and function. Trivial mitral  valve regurgitation. No evidence of mitral stenosis.   5. The aortic valve is tricuspid. Aortic valve  regurgitation is trivial.  No aortic stenosis is present.   6. Aortic dilatation noted. 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   7. The inferior vena cava is dilated in size with >50% respiratory  variability, suggesting right atrial pressure of 8 mmHg.   Patient Profile     64 y.o. 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   Assessment & Plan    1. 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  WCT: -Concerning for SVT with aberrancy versus sustained VT -Status post shock -No further sustained WCT on telemetry -Likely in the setting of his polysubstance abuse with methamphetamine and cocaine in the context of severe underlying cardiomyopathy -Previously refused transfer to [facility] for ICD and left AMA indicating he was concerned someone would steal his cocaine/methamphetamine -In this context, he has been felt to not be a candidate for immediate ICD implantation at this time given ongoing polysubstance abuse, medical noncompliance, and high risk for device and lead infection -He does indicate he is "done with drugs" -Place LifeVest, if he remains abstinent from drugs, and follows up as directed, we may be able to consider EP referral for consideration of ICD  -Continue oral  amiodarone 400 mg bid for 1 week, followed by 200 mg bid x 1 week, then 200 mg daily thereafter  -Relative hypotension and asymptomatic bradycardia preclude addition of beta-blocker  3.  CAD involving the native coronary arteries with elevated troponin: -Chest pain-free -Minimal elevation in high-sensitivity troponin peaking at 163, which is likely continued downward trend from his recent elevated troponin at the end of June -Recent LHC on 10/08/2020 demonstrated severe one-vessel CAD with CTO of the proximal LAD stent with faint right to left collaterals with no obstructive disease affecting the LCx and RCA.  It was suspected the LAD stent had been occluded for a long time without significant benefit from revascularization with continued medical therapy recommended -No indication for heparin drip at this time -ASA -Atorvastatin  4.  Medical nonadherence/polysubstance abuse: -Prior admission, left AMA -Without complete cessation of polysubstance use and medication adherence patient will have a very poor outcome with premature cardiopulmonary death -He has been evaluated by palliative care and indicates he is "done with drugs" and wants full scope of care  5. Sleep disordered breathing: -Significant snoring noted on the floor  -Contributing to underlying bradycardia -Needs outpatient sleep study    Dispo: -Once he has been fit for a LifeVest and staffed by cardiology rounding MD, he can be discharged from our perspective -I will arrange follow up in our office    For questions or updates, please contact [facility] Please consult [email or web address] for contact info under Cardiology/STEMI.    Signed, Eula Listen, PA-C [facility] Pager: [phone] 10/17/2020, 7:44 AM

## 2020-10-17 NOTE — Discharge Summary (Signed)
[facility] NAME: Douglas Conway    MR#:  [id number]  DATE OF BIRTH:  1955-09-06  DATE OF ADMISSION:  10/13/2020 ADMITTING PHYSICIAN: Loletha Grayer, MD  DATE OF DISCHARGE: 10/17/2020 11:33 AM  PRIMARY CARE PHYSICIAN: Pcp, No    ADMISSION DIAGNOSIS:  Ventricular tachyarrhythmia (HCC) [I47.2] Wide-complex tachycardia (HCC) [I47.2] NSTEMI (non-ST elevated myocardial infarction) (HCC) [I21.4]  DISCHARGE DIAGNOSIS:  Active Problems:   Hypothyroidism   Chronic systolic CHF (congestive heart failure) (HCC)   Wide-complex tachycardia (HCC)   HFrEF (heart failure with reduced ejection fraction) (HCC)   Elevated troponin   Hyperlipidemia   Vitamin D deficiency   NSTEMI (non-ST elevated myocardial infarction) ([place])   SECONDARY DIAGNOSIS:   Past Medical History:  Diagnosis Date  . 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 Chronic systolic congestive heart failure ([place]) 05/14/2019   a. 06/2019 Echo: EF 30-35%, glob HK. gr1 DD. Mod elev RVSP. Midly dil LA, Triv MR/AI. Asc Ao 17m.  . Coronary artery disease    a. 03/2005 Ant STEMI s/p PCI/DES to the LAD; b. 05/2010 St echo: reportedly nl.  .[place] KitchenHIV disease ([place] 06/28/2018  . Hyperlipidemia LDL goal <70   . Hypertension 06/28/2018  . Hypothyroidism 01/07/2019  . Lateral epicondylitis 01/07/2019  . Noncompliance   . STEM[place] (ST elevation myocardial infarction) ([place]   . Tobacco abuse     HOSPITAL COURSE:   Wide-complex tachycardia.  Patient was initially admitted and started on amiodarone drip.  Converted over to oral amiodarone on 10/16/2020.  With the patient's positive urine drug toxicology with methamphetamine and cocaine cardiology did not want to place an ICD at this time.  The patient had a LifeVest that was fitted this morning. Chronic systolic congestive heart failure with CAD.  Continue aspirin and atorvastatin.  Blood pressure too low for Toprol and Entresto at this time.  Patient  tolerating low-dose losartan and blood pressure on the lower side.  Last echocardiogram showed an EF less than 20%. Hyperlipidemia unspecified on atorvastatin Hypothyroidism unspecified on levothyroxine Vitamin D deficiency on oral supplementation HIV.  Followed by infectious disease for injections with Cabotegravir and Rilpivirine every 2 months Acute kidney injury with a creatinine of 1.42 on admission and down to 1.0 upon discharge.  DISCHARGE CONDITIONS:   Satisfactory  CONSULTS OBTAINED:  Treatment Team:  AKate Sable MD  DRUG ALLERGIES:   Allergies  Allergen Reactions  . Plavix [Clopidogrel Bisulfate] Rash    DISCHARGE MEDICATIONS:   Allergies as of 10/17/2020       Reactions   Plavix [clopidogrel Bisulfate] Rash        Medication List     STOP taking these medications    metoprolol succinate 25 MG 24 hr tablet Commonly known as: TOPROL-XL   rosuvastatin 20 MG tablet Commonly known as: CRESTOR   sacubitril-valsartan 24-26 MG Commonly known as: ENTRESTO       TAKE these medications    amiodarone 200 MG tablet Commonly known as: PACERONE 2 tablets po twice a day for one week; then 1 tablet twice a day for one week then 1 tablet daily afterwards   aspirin 81 MG EC tablet Take 1 tablet (81 mg total) by mouth daily. Swallow whole.   atorvastatin 20 MG tablet Commonly known as: LIPITOR Take 1 tablet (20 mg total) by mouth daily.   Cabenuva 600 & 900 MG/3ML injection Generic drug: cabotegravir & rilpivirine ER Inject 1 kit into the muscle every 2 (  two) months.   levothyroxine 175 MCG tablet Commonly known as: SYNTHROID Take 175 mcg by mouth daily before breakfast.   losartan 25 MG tablet Commonly known as: COZAAR Take 0.5 tablets (12.5 mg total) by mouth daily. Start taking on: October 18, 2020   polyethylene glycol 17 g packet Commonly known as: MIRALAX / GLYCOLAX Take 17 g by mouth daily as needed for moderate constipation.   Vitamin  D-3 125 MCG (5000 UT) Tabs Take 5,000 Units by mouth daily.               Durable Medical Equipment  (From admission, onward)           Start     Ordered   10/16/20 1417  For home use only DME Vest life vest  Once       Comments: VT 150 bpm VF 200 bpm 150 J x 5 Length of need: 3 months Start date 10/16/2020   10/16/20 1419             DISCHARGE INSTRUCTIONS:   Follow-up cardiology as outpatient Follow-up with your PMD infectious disease doctor as scheduled  If you experience worsening of your admission symptoms, develop shortness of breath, life threatening emergency, suicidal or homicidal thoughts you must seek medical attention immediately by calling 911 or calling your MD immediately  if symptoms less severe.  You Must read complete instructions/literature along with all the possible adverse reactions/side effects for all the Medicines you take and that have been prescribed to you. Take any new Medicines after you have completely understood and accept all the possible adverse reactions/side effects.   Please note  You were cared for by a hospitalist during your hospital stay. If you have any questions about your discharge medications or the care you received while you were in the hospital after you are discharged, you can call the unit and asked to speak with the hospitalist on call if the hospitalist that took care of you is not available. Once you are discharged, your primary care physician will handle any further medical issues. Please note that NO REFILLS for any discharge medications will be authorized once you are discharged, as it is imperative that you return to your primary care physician (or establish a relationship with a primary care physician if you do not have one) for your aftercare needs so that they can reassess your need for medications and monitor your lab values.    Today   CHIEF COMPLAINT:   Chief Complaint  Patient presents with  . Chest  Pain    HISTORY OF PRESENT ILLNESS:  Douglas Conway  is a 65 y.o. male came in with chest pain and found to be in a wide-complex tachycardia   VITAL SIGNS:  Blood pressure 99/60, pulse (!) 57, temperature 98.1 F (36.7 C), resp. rate 17, height _0  (1.854 m), weight 86.8 kg, SpO2 93 %.  I/O:   Intake/Output Summary (Last 24 hours) at 10/17/2020 1405 Last data filed at 10/17/2020 0959 Gross per 24 hour  Intake 1080 ml  Output 600 ml  Net 480 ml    PHYSICAL EXAMINATION:  GENERAL:  65 y.o.-year-old patient lying in the bed with no acute distress.  EYES: Pupils equal, round, reactive to light and accommodation. No scleral icterus. Extraocular muscles intact.  HEENT: Head atraumatic, normocephalic. Oropharynx and nasopharynx clear.  LUNGS: Normal breath sounds bilaterally, no wheezing, rales,rhonchi or crepitation. No use of accessory muscles of respiration.  CARDIOVASCULAR: S1, S2 normal. No  murmurs, rubs, or gallops.  ABDOMEN: Soft, non-tender, non-distended.  EXTREMITIES: No pedal edema.  NEUROLOGIC: Cranial nerves II through XII are intact. Muscle strength 5/5 in all extremities. Sensation intact. Gait not checked.  PSYCHIATRIC: The patient is alert and oriented x 3.  SKIN: No obvious rash, lesion, or ulcer.   DATA REVIEW:   CBC Recent Labs  Lab 10/15/20 0414  WBC 7.1  HGB 15.6  HCT 47.4  PLT 207    Chemistries  Recent Labs  Lab 10/13/20 2244 10/14/20 0157 10/14/20 0636 10/17/20 0456  NA 140 138   < > 136  K 3.5 3.5   < > 4.3  CL 104 106   < > 103  CO2 27 23   < > 27  GLUCOSE 176* 103*   < > 99  BUN 31* 30*   < > 22  CREATININE 1.42* 1.14   < > 1.00  CALCIUM 9.6 8.8*   < > 9.2  MG 2.3 1.9  --   --   AST 50*  --   --   --   ALT 44  --   --   --   ALKPHOS 81  --   --   --   BILITOT 0.7  --   --   --    < > = values in this interval not displayed.     Microbiology Results  Results for orders placed or performed during the hospital encounter of 10/13/20   Resp Panel by RT-PCR (Flu A&B, Covid) Nasopharyngeal Swab     Status: None   Collection Time: 10/13/20 10:44 PM   Specimen: Nasopharyngeal Swab; Nasopharyngeal(NP) swabs in vial transport medium  Result Value Ref Range Status   SARS Coronavirus 2 by RT PCR NEGATIVE NEGATIVE Final    Comment: (NOTE) SARS-CoV-2 target nucleic acids are NOT DETECTED.  The SARS-CoV-2 RNA is generally detectable in upper respiratory specimens during the acute phase of infection. The lowest concentration of SARS-CoV-2 viral copies this assay can detect is 138 copies/mL. A negative result does not preclude SARS-Cov-2 infection and should not be used as the sole basis for treatment or other patient management decisions. A negative result may occur with  improper specimen collection/handling, submission of specimen other than nasopharyngeal swab, presence of viral mutation(s) within the areas targeted by this assay, and inadequate number of viral copies(<138 copies/mL). A negative result must be combined with clinical observations, patient history, and epidemiological information. The expected result is Negative.  Fact Sheet for Patients:  EntrepreneurPulse.com.au  Fact Sheet for Healthcare Providers:  IncredibleEmployment.be  This test is no t yet approved or cleared by the [place] FDA and  has been authorized for detection and/or diagnosis of SARS-CoV-2 by FDA under an Emergency Use Authorization (EUA). This EUA will remain  in effect (meaning this test can be used) for the duration of the COVID-19 declaration under Section 564(b)(1) of the Act, 21 U.S.C.section 360bbb-3(b)(1), unless the authorization is terminated  or revoked sooner.       Influenza A by PCR NEGATIVE NEGATIVE Final   Influenza B by PCR NEGATIVE NEGATIVE Final    Comment: (NOTE) The Xpert Xpress SARS-CoV-2/FLU/RSV plus assay is intended as an aid in the diagnosis of influenza from  Nasopharyngeal swab specimens and should not be used as a sole basis for treatment. Nasal washings and aspirates are unacceptable for Xpert Xpress SARS-CoV-2/FLU/RSV testing.  Fact Sheet for Patients: EntrepreneurPulse.com.au  Fact Sheet for Healthcare Providers: IncredibleEmployment.be  This test  is not yet approved or cleared by the [place] and has been authorized for detection and/or diagnosis of SARS-CoV-2 by FDA under an Emergency Use Authorization (EUA). This EUA will remain in effect (meaning this test can be used) for the duration of the COVID-19 declaration under Section 564(b)(1) of the Act, 21 U.S.C. section 360bbb-3(b)(1), unless the authorization is terminated or revoked.  Performed at [facility], [address] [place], [place] [address]       Management plans discussed with the patient, and he is in agreement.  CODE STATUS:     Code Status Orders  (From admission, onward)           Start     Ordered   10/14/20 0043  Full code  Continuous        10/14/20 0044           Code Status History     Date Active Date Inactive Code Status Order ID Comments User Context   10/07/2020 2338 10/09/2020 0116 Full Code [id number]  Lang Snow, NP ED   08/19/2019 0504 08/20/2019 1431 Full Code [id number]  [name], Arvella Merles, MD ED       TOTAL TIME TAKING CARE OF THIS PATIENT: 32 minutes.    Loletha Grayer M.D on 10/17/2020 at 2:05 PM  Between 7am to 6pm - Pager - [phone]  After 6pm go to [email or web address] - password [email or web address]  [facility]  CC: Primary care physician; Pcp, No

## 2020-10-19 ENCOUNTER — Telehealth: Payer: Self-pay

## 2020-10-19 NOTE — Telephone Encounter (Signed)
Attempted to schedule hospital fu   No ans no vm.

## 2020-10-21 ENCOUNTER — Ambulatory Visit: Payer: Medicaid Other | Admitting: Family

## 2020-10-26 NOTE — Progress Notes (Deleted)
   Patient ID: Douglas Conway, male    DOB: Jul 01, 1955, 65 y.o.   MRN: [id number]  HPI  [name] is a 65 y/o male with a history of  Echo report from 10/08/20 reviewed and showed an EF of < 20%  LHC completed 10/08/20 and showed: Prox RCA lesion is 20% stenosed. Prox LAD to Mid LAD lesion is 100% stenosed.  1.  Severe one-vessel coronary artery disease with chronically occluded proximal LAD stent with faint right to left collaterals.  No obstructive disease affecting the RCA and left circumflex. 2.  Left ventricular angiography was not performed.  EF was severely reduced by echo at 15%. 3.  Right heart catheterization showed mild to moderate elevation in filling pressures, moderate to severe pulmonary hypertension and severely reduced cardiac output.  Admitted 10/13/20 due to wide complex tachycardia. Initially started on amiodarone drip and then transitioned to oral amiodarone. Cardiology and palliative care consults obtained. Urine drug toxicology with methamphetamine and cocaine. Lifevest placed. BP too low for toprol or entresto. Low dose losartan started. Discharged after 4 days.   He presents today for his initial visit with a chief complaint of   Review of Systems    Physical Exam  Assessment & Plan:  1: Chronic heart failure with reduced ejection fraction- - NYHA class - saw cardiology ([facility]) 09/17/20 - BNP 10/13/20 was 776.4  2: HTN- - BP - BMP 10/17/20 reviewed and showed sodium 136, potassium 4.3, creatinine 1.00 and GFR >60  3: Wide complex tachycardia- - wearing lifevest

## 2020-10-27 ENCOUNTER — Ambulatory Visit: Payer: Medicaid Other | Admitting: Family

## 2020-10-27 ENCOUNTER — Telehealth: Payer: Self-pay | Admitting: Family

## 2020-10-27 NOTE — Telephone Encounter (Signed)
Patient did not show for his Heart Failure Clinic appointment on 10/27/20. Will attempt to reschedule.   

## 2020-10-30 ENCOUNTER — Ambulatory Visit (INDEPENDENT_AMBULATORY_CARE_PROVIDER_SITE_OTHER): Payer: Medicaid Other | Admitting: Cardiology

## 2020-10-30 ENCOUNTER — Encounter: Payer: Self-pay | Admitting: Cardiology

## 2020-10-30 ENCOUNTER — Other Ambulatory Visit: Payer: Self-pay

## 2020-10-30 VITALS — BP 118/72 | HR 79 | Ht 73.0 in | Wt 189.0 lb

## 2020-10-30 DIAGNOSIS — F1910 Other psychoactive substance abuse, uncomplicated: Secondary | ICD-10-CM

## 2020-10-30 DIAGNOSIS — I2510 Atherosclerotic heart disease of native coronary artery without angina pectoris: Secondary | ICD-10-CM

## 2020-10-30 DIAGNOSIS — I10 Essential (primary) hypertension: Secondary | ICD-10-CM | POA: Diagnosis not present

## 2020-10-30 DIAGNOSIS — E7800 Pure hypercholesterolemia, unspecified: Secondary | ICD-10-CM | POA: Diagnosis not present

## 2020-10-30 DIAGNOSIS — I472 Ventricular tachycardia: Secondary | ICD-10-CM

## 2020-10-30 DIAGNOSIS — I5022 Chronic systolic (congestive) heart failure: Secondary | ICD-10-CM

## 2020-10-30 DIAGNOSIS — R000 Tachycardia, unspecified: Secondary | ICD-10-CM

## 2020-10-30 MED ORDER — LOSARTAN POTASSIUM 25 MG PO TABS: 25.0000 mg | ORAL_TABLET | Freq: Every day | ORAL | 3 refills | Status: DC

## 2020-10-30 MED ORDER — ATORVASTATIN CALCIUM 20 MG PO TABS: 20.0000 mg | ORAL_TABLET | Freq: Every day | ORAL | 3 refills | Status: DC

## 2020-10-30 NOTE — Progress Notes (Signed)
Date:  10/30/2020   ID:  [name], DOB 02-06-1956, MRN [id number]  Patient Location: Home Provider Location: Home  PCP:  Pcp, No  Cardiologist: Fransico Him, MD  Evaluation Performed:  Followup  Reason for visit: Chronic systolic CHF  History of Present Illness:    Douglas Conway is a 65 y.o. male with prior medical history of ischemic cardiomyopathy, we currently do not have any records however based on his description it appears that at the time the patient was incarcerated, he developed severe acute onset chest pain and it took over 30 hours to take him to the hospital where he was diagnosed with STEMI in LAD distribution, stent was placed in December 2006 at [facility].  At the time his LVEF was 25%, however with medical therapy it improved to over 35% and he has never received an ICD.  The patient followed cardiologist in [place] but had not seen one in at least 5 years.    He reestablished care with Dr. Meda Coffee  and had been doing well running 3 miles 3-4 times a week without any chest pain or shortness of breath.   He is allergic to Plavix with rash, he is a lifelong smoker but lately only smokes sporadically, previously pack a day.  He also has a history of hyperthyroidism that was diagnosed at the time of STEMI.  He has chronic DOE followed by Pulmonary.    When I saw him last him I started Toprol XL 15m daily, Entresto 24-253mBID.  [facility] and [facility].  This occurred in the setting of polysubstance abuse with methamphetamine and cocaine.  2D echo was done showing EF <20% and severe RV dysfunction with moderate pulmonary HTN.  He underwent cath showing 20% PRCA and occluded prox to mid LAD that was chronic with faint R>L collaterals and EF 15%. He was seen by Cards and plan was to place LifeVest and if he remains abstinent from drugs then consider EP evaluation.  He was started on Amio 40021mID for 1 week  and then 200m57mD for 1 week and then 200mg67mly.  BB were stopped due to soft BP.  His Entresto was stopped due to soft BP and he was started on low dose Losartan. It was recommended that he be referred to AHF service outpt.   He is here today for followup and is doing well.  He denies any chest pain or pressure, SOB, DOE, PND, orthopnea, LE edema, dizziness, palpitations or syncope. He is compliant with his meds and is tolerating meds with no SE.    Past Medical History:  Diagnosis Date   Change in hearing, bilateral 06/1991/90/3009ronic systolic congestive heart failure (HCC) [place]02/2021   a. 06/2019 Echo: EF 30-35%, glob HK. gr1 DD. Mod elev RVSP. Midly dil LA, Triv MR/AI. Asc Ao 37mm.19moronary artery disease    a. 03/2005 Ant STEMI s/p PCI/DES to the LAD; b. 05/2010 St echo: reportedly nl.   HIV disease (HCC) 0[place]9/2020   Hyperlipidemia LDL goal <70    Hypertension 06/28/2018   Hypothyroidism 01/07/2019   Lateral epicondylitis 01/07/2019   Noncompliance    STEMI (ST elevation myocardial infarction) (HCC)  [place] abuse    Past Surgical History:  Procedure Laterality Date   CORONARY ANGIOPLASTY WITH STENT PLACEMENT     LEFT HEART CATH AND CORONARY ANGIOGRAPHY N/A 10/08/2020   Procedure: LEFT HEART CATH  AND CORONARY ANGIOGRAPHY;  Surgeon: Wellington Hampshire, MD;  Location: [facility];  Service: Cardiovascular;  Laterality: N/A;    Current Meds  Medication Sig   amiodarone (PACERONE) 200 MG tablet 2 tablets po twice a day for one week; then 1 tablet twice a day for one week then 1 tablet daily afterwards   aspirin EC 81 MG EC tablet Take 1 tablet (81 mg total) by mouth daily. Swallow whole.   atorvastatin (LIPITOR) 20 MG tablet Take 1 tablet (20 mg total) by mouth daily.   cabotegravir & rilpivirine ER (CABENUVA) 600 & 900 MG/3ML injection Inject 1 kit into the muscle every 2 (two) months.   Cholecalciferol (VITAMIN D-3) 125 MCG (5000 UT) TABS Take 5,000 Units by mouth  daily.   levothyroxine (SYNTHROID) 175 MCG tablet Take 1 tablet (175 mcg total) by mouth daily before breakfast.   losartan (COZAAR) 25 MG tablet Take 0.5 tablets (12.5 mg total) by mouth daily.   polyethylene glycol (MIRALAX / GLYCOLAX) 17 g packet Take 17 g by mouth daily as needed for moderate constipation.    Allergies:   Plavix [clopidogrel bisulfate]   Social History   Tobacco Use   Smoking status: Every Day    Packs/day: 1.00    Years: 42.00    Pack years: 42.00    Types: Cigarettes    Start date: 01/07/1968   Smokeless tobacco: Never   Tobacco comments:    Smoked 1 ppd for most of his adult life but now smoking ~ 1/3 ppd.  Vaping Use   Vaping Use: Never used  Substance Use Topics   Alcohol use: Yes    Alcohol/week: 7.0 standard drinks    Types: 7 Cans of beer per week    Comment: 1 can/beer/daily   Drug use: Yes    Types: Amphetamines    Comment: smokes or snorts crystal meth 2-3x/wk.     Family Hx: The patient's family history is not on file.  ROS:   Please see the history of present illness.    All other systems reviewed and are negative.   Prior CV studies:   The following studies were reviewed today:  Labs/Other Tests and Data Reviewed:    EKG:  EKG was not performed today  Recent Labs: 10/07/2020: TSH 65.394 10/13/2020: ALT 44; B Natriuretic Peptide 776.4 10/14/2020: Magnesium 1.9 10/15/2020: Hemoglobin 15.6; Platelets 207 10/17/2020: BUN 22; Creatinine, Ser 1.00; Potassium 4.3; Sodium 136   Recent Lipid Panel Lab Results  Component Value Date/Time   CHOL 106 03/18/2020 02:57 PM   CHOL 196 06/12/2019 10:53 AM   TRIG 130 03/18/2020 02:57 PM   HDL 42 03/18/2020 02:57 PM   HDL 57 06/12/2019 10:53 AM   CHOLHDL 2.5 03/18/2020 02:57 PM   LDLCALC 43 03/18/2020 02:57 PM    Wt Readings from Last 3 Encounters:  10/30/20 189 lb (85.7 kg)  10/16/20 191 lb 4.8 oz (86.8 kg)  10/08/20 190 lb 4.1 oz (86.3 kg)     Objective:    Vital Signs:  BP 118/72    Pulse 79   Ht '6\' 1"'  (1.854 m)   Wt 189 lb (85.7 kg)   SpO2 96%   BMI 24.94 kg/m    GEN: Well nourished, well developed in no acute distress HEENT: Normal NECK: No JVD; No carotid bruits LYMPHATICS: No lymphadenopathy CARDIAC:RRR, no murmurs, rubs, gallops RESPIRATORY:  Clear to auscultation without rales, wheezing or rhonchi  ABDOMEN: Soft, non-tender, non-distended MUSCULOSKELETAL:  No edema;  No deformity  SKIN: Warm and dry NEUROLOGIC:  Alert and oriented x 3 PSYCHIATRIC:  Normal affect   ASSESSMENT & PLAN:    ASCAD -history of STEMI in 2006 with stenting to LAD, -recent admission with WCT and worsening LVF in the setting of polysubstance abuse showing chronically occluded LAD with faint right to left collaterals -denies any chest pain today -Continue prescription drug management with ASA 10m daily and Atorvastatin 236mdaily -BP has been too soft for BB   2. Ischemic cardiomyopathy/Chronic systolic CHF -post STEMI 2585%mproved to about 35% -repeat 2D echo 06/2019 showed EF 30-35% with global HK with moderate PHTN -recent admission with WCT showed EF declined to < 20% with occluded LAD on cath and right to left collaterals>in setting of polysubstance abuse -Bp did not tolerate BB or Entresto in the past -increase Losartan to 2588maily -check BMET in 1 week -consider addition of Farxiga>>unclear he will be compliant or will be able to afford it -Currently has LifeVest until he can prove he can stay off drugs -will refer to EP and AHF service  3.  Hypertension  -Bp stable on exam today -continue Losartan and increase to 34m60mily -I have personally reviewed and interpreted outside labs performed by patient's PCP which showed SCr 1, K+ 4.3 in July 2022   4.  HLD - goal < 70 -LDL 43 in Dec 2021 -Continue prescription drug management with Atorvastatin 20mg56mly>refilled for 1 year     5.  Wide complex tachycardia -resulted in recent admission -s/p cardioversion to  NSR -EF declined and now with Life Vest -continue Amio 200mg 46my -refer to EP  6.  Polysubstance abuse -he denies any further cocaine use since hospitalization  [facility] [place] [facility]  Medication Adjustments/Labs and Tests Ordered: Current medicines are reviewed at length with the patient today.  Concerns regarding medicines are outlined above.   Tests Ordered: No orders of the defined types were placed in this encounter.   Medication Changes: No orders of the defined types were placed in this encounter.   Follow Up:  In Person in 2 month(s)  Signed, Dorthy Magnussen Fransico Him7/22/2022 3:23 PM    [facility]

## 2020-10-30 NOTE — Patient Instructions (Addendum)
Medication Instructions:  Your physician has recommended you make the following change in your medication:  1) INCREASE losartan to 25 mg daily  *If you need a refill on your cardiac medications before your next appointment, please call your pharmacy*   Lab Work: BMET in one week on 7/29 between 7:30am and 4:30pm If you have labs (blood work) drawn today and your tests are completely normal, you will receive your results only by: MyChart Message (if you have MyChart) OR A paper copy in the mail If you have any lab test that is abnormal or we need to change your treatment, we will call you to review the results.  Follow-Up: At [facility], you and your health needs are our priority.  As part of our continuing mission to provide you with exceptional heart care, we have created designated Provider Care Teams.  These Care Teams include your primary Cardiologist (physician) and Advanced Practice Providers (APPs -  Physician Assistants and Nurse Practitioners) who all work together to provide you with the care you need, when you need it.  Your next appointment:   4-6 week(s)  The format for your next appointment:   In Person  Provider:   You may see one of the following Advanced Practice Providers on your designated Care Team:   Nicolasa Ducking, NP Eula Listen, PA-C Marisue Ivan, PA-C Cadence Fransico Michael, [place]   Other Instructions You have been referred to see an electrophysiologist You have been referred to the Advanced Heart Failure Clinic

## 2020-10-30 NOTE — Addendum Note (Signed)
Addended by: Theresia Majors on: 10/30/2020 03:39 PM   Modules accepted: Orders

## 2020-11-03 ENCOUNTER — Telehealth: Payer: Self-pay

## 2020-11-03 NOTE — Telephone Encounter (Signed)
Patient informed and verbalized understanding. Douglas Conway T Douglas Conway

## 2020-11-03 NOTE — Telephone Encounter (Signed)
Patient called stating he was dealing with sciatica pain and was seen at an urgent care. Patient stated he was prescribed prednisone, but wanted to make sure he could take this with him being on [place]. Please advised Keyion Knack T Jacqui Headen]

## 2020-11-03 NOTE — Telephone Encounter (Signed)
There are no medication interactions and should be fine.

## 2020-11-05 NOTE — Telephone Encounter (Signed)
**Note De-Identified Nina Mondor Obfuscation** Letter received from [employer] pt asst [facility] stating that a Protivin PA is required per their review dept.  Per the pts chart he stopped taking Entresto at his hospital d/c on 10/17/20. Pt Asst no longer needed.

## 2020-11-06 ENCOUNTER — Other Ambulatory Visit: Payer: Medicaid Other

## 2020-11-09 ENCOUNTER — Other Ambulatory Visit: Payer: Self-pay | Admitting: Pharmacist

## 2020-11-09 ENCOUNTER — Other Ambulatory Visit (HOSPITAL_COMMUNITY): Payer: Self-pay

## 2020-11-09 DIAGNOSIS — B20 Human immunodeficiency virus [HIV] disease: Secondary | ICD-10-CM

## 2020-11-09 MED ORDER — CABENUVA 600 & 900 MG/3ML IM SUER
1.0000 | INTRAMUSCULAR | 5 refills | Status: DC
  Filled 2020-11-09: qty 6, 60d supply, fill #0
  Filled 2021-01-11: qty 6, 60d supply, fill #1
  Filled 2021-03-03: qty 6, 60d supply, fill #2
  Filled 2021-05-17: qty 6, 60d supply, fill #3
  Filled 2021-07-13: qty 6, 60d supply, fill #4
  Filled 2021-09-13: qty 6, 60d supply, fill #5

## 2020-11-10 ENCOUNTER — Telehealth: Payer: Self-pay

## 2020-11-10 NOTE — Telephone Encounter (Signed)
[facility] Patient Advocate Encounter  Patient's medication(Cabenuva) have been couriered to [facility] from [facility] and will be administered on patinet next office visit on 11/16/20.  Clearance Coots , CPhT Specialty Pharmacy Patient [facility] Infectious Disease Phone: [phone] Fax:  [phone]

## 2020-11-13 ENCOUNTER — Other Ambulatory Visit: Payer: Self-pay

## 2020-11-13 ENCOUNTER — Encounter: Payer: Self-pay | Admitting: Family

## 2020-11-13 ENCOUNTER — Encounter
Admission: RE | Admit: 2020-11-13 | Discharge: 2020-11-13 | Disposition: A | Discharge status: Home / Self Care | Payer: Medicaid Other | Source: Ambulatory Visit | Attending: Cardiology | Admitting: Cardiology

## 2020-11-13 ENCOUNTER — Ambulatory Visit (HOSPITAL_BASED_OUTPATIENT_CLINIC_OR_DEPARTMENT_OTHER): Payer: Medicaid Other | Admitting: Family

## 2020-11-13 VITALS — BP 131/85 | HR 74 | Resp 18 | Ht 73.0 in | Wt 189.0 lb

## 2020-11-13 DIAGNOSIS — Z888 Allergy status to other drugs, medicaments and biological substances status: Secondary | ICD-10-CM | POA: Insufficient documentation

## 2020-11-13 DIAGNOSIS — Z7982 Long term (current) use of aspirin: Secondary | ICD-10-CM | POA: Insufficient documentation

## 2020-11-13 DIAGNOSIS — I2510 Atherosclerotic heart disease of native coronary artery without angina pectoris: Secondary | ICD-10-CM | POA: Diagnosis not present

## 2020-11-13 DIAGNOSIS — I110 Hypertensive heart disease with heart failure: Secondary | ICD-10-CM | POA: Insufficient documentation

## 2020-11-13 DIAGNOSIS — E7800 Pure hypercholesterolemia, unspecified: Secondary | ICD-10-CM | POA: Diagnosis not present

## 2020-11-13 DIAGNOSIS — E785 Hyperlipidemia, unspecified: Secondary | ICD-10-CM | POA: Insufficient documentation

## 2020-11-13 DIAGNOSIS — F32A Depression, unspecified: Secondary | ICD-10-CM | POA: Insufficient documentation

## 2020-11-13 DIAGNOSIS — Z955 Presence of coronary angioplasty implant and graft: Secondary | ICD-10-CM | POA: Diagnosis not present

## 2020-11-13 DIAGNOSIS — I472 Ventricular tachycardia: Secondary | ICD-10-CM

## 2020-11-13 DIAGNOSIS — Z79899 Other long term (current) drug therapy: Secondary | ICD-10-CM | POA: Insufficient documentation

## 2020-11-13 DIAGNOSIS — Z01812 Encounter for preprocedural laboratory examination: Secondary | ICD-10-CM | POA: Insufficient documentation

## 2020-11-13 DIAGNOSIS — Z21 Asymptomatic human immunodeficiency virus [HIV] infection status: Secondary | ICD-10-CM | POA: Insufficient documentation

## 2020-11-13 DIAGNOSIS — I5022 Chronic systolic (congestive) heart failure: Secondary | ICD-10-CM | POA: Insufficient documentation

## 2020-11-13 DIAGNOSIS — F17210 Nicotine dependence, cigarettes, uncomplicated: Secondary | ICD-10-CM | POA: Insufficient documentation

## 2020-11-13 DIAGNOSIS — R000 Tachycardia, unspecified: Secondary | ICD-10-CM

## 2020-11-13 DIAGNOSIS — I10 Essential (primary) hypertension: Secondary | ICD-10-CM

## 2020-11-13 DIAGNOSIS — F1910 Other psychoactive substance abuse, uncomplicated: Secondary | ICD-10-CM

## 2020-11-13 LAB — BASIC METABOLIC PANEL
Anion gap: 7 (ref 5–15)
BUN: 28 mg/dL — ABNORMAL HIGH (ref 8–23)
CO2: 25 mmol/L (ref 22–32)
Calcium: 8.9 mg/dL (ref 8.9–10.3)
Chloride: 108 mmol/L (ref 98–111)
Creatinine, Ser: 0.91 mg/dL (ref 0.61–1.24)
GFR, Estimated: >60 mL/min (ref >60)
Glucose, Bld: 100 mg/dL — ABNORMAL HIGH (ref 70–99)
Potassium: 4 mmol/L (ref 3.5–5.1)
Sodium: 140 mmol/L (ref 135–145)

## 2020-11-13 MED ORDER — AMIODARONE HCL 200 MG PO TABS: 200.0000 mg | ORAL_TABLET | Freq: Every day | ORAL | 3 refills | Status: DC

## 2020-11-13 NOTE — Progress Notes (Signed)
Patient ID: Douglas Conway, male    DOB: 13-Jun-1955, 65 y.o.   MRN: [id number]  HPI  Douglas Conway is a 65 y/o male with a history of CAD, hyperlipidemia, HTN, thyroid disease, HIV, wide complex tachycardia, current tobacco use and chronic heart failure.   Echo report from 10/08/20 reviewed and showed an EF of < 20%  LHC completed 10/08/20 and showed: Prox RCA lesion is 20% stenosed. Prox LAD to Mid LAD lesion is 100% stenosed.  1.  Severe one-vessel coronary artery disease with chronically occluded proximal LAD stent with faint right to left collaterals.  No obstructive disease affecting the RCA and left circumflex. 2.  Left ventricular angiography was not performed.  EF was severely reduced by echo at 15%. 3.  Right heart catheterization showed mild to moderate elevation in filling pressures, moderate to severe pulmonary hypertension and severely reduced cardiac output.  Admitted 10/13/20 due to wide complex tachycardia. Initially started on amiodarone drip and then transitioned to oral amiodarone. Cardiology and palliative care consults obtained. Urine drug toxicology with methamphetamine and cocaine. Lifevest placed. BP too low and toprol and entresto stopped. Low dose losartan started. Discharged after 4 days.   He presents today for his initial visit with a chief complaint of palpitations. He says that these have been intermittent for several months but have worsened since he ran out of his amiodarone 2 days ago. He has associated chest tightness, dry cough, minimal ankle swelling, sciatica in right hip and depression along with this. He denies any difficulty sleeping, dizziness, abdominal distention, chest pain, shortness of breath or fatigue.   Weighs himself but not daily. 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 Wearing lifevest but not consistently.   Past Medical History:  Diagnosis Date   Change in hearing, bilateral 88/02/314   Chronic systolic congestive heart failure ([place]) 05/14/2019    a. 06/2019 Echo: EF 30-35%, glob HK. gr1 DD. Mod elev RVSP. Midly dil LA, Triv Douglas/AI. Asc Ao 44m.   Coronary artery disease    a. 03/2005 Ant STEMI s/p PCI/DES to the LAD; b. 05/2010 St echo: reportedly nl.   HIV disease ([place] 06/28/2018   Hyperlipidemia LDL goal <70    Hypertension 06/28/2018   Hypothyroidism 01/07/2019   Lateral epicondylitis 01/07/2019   Noncompliance    STEMI (ST elevation myocardial infarction) ([place]    Tobacco abuse    Past Surgical History:  Procedure Laterality Date   CORONARY ANGIOPLASTY WITH STENT PLACEMENT     LEFT HEART CATH AND CORONARY ANGIOGRAPHY N/A 10/08/2020   Procedure: LEFT HEART CATH AND CORONARY ANGIOGRAPHY;  Surgeon: AWellington Hampshire MD;  [facility];  Service: Cardiovascular;  Laterality: N/A;   No family history on file. Social History   Tobacco Use   Smoking status: Every Day    Packs/day: 1.00    Years: 42.00    Pack years: 42.00    Types: Cigarettes    Start date: 01/07/1968   Smokeless tobacco: Never   Tobacco comments:    Smoked 1 ppd for most of his adult life but now smoking ~ 1/3 ppd.  Substance Use Topics   Alcohol use: Yes    Alcohol/week: 7.0 standard drinks    Types: 7 Cans of beer per week    Comment: 1 can/beer/daily   Allergies  Allergen Reactions   Plavix [Clopidogrel Bisulfate] Rash   Prior to Admission medications   Medication Sig Start Date End Date Taking? Authorizing Provider  aspirin EC  81 MG EC tablet Take 1 tablet (81 mg total) by mouth daily. Swallow whole. 10/17/20  Yes Wieting, Richard, MD  atorvastatin (LIPITOR) 20 MG tablet Take 1 tablet (20 mg total) by mouth daily. 10/30/20  Yes Turner, Eber Hong, MD  cabotegravir & rilpivirine ER (CABENUVA) 600 & 900 MG/3ML injection Inject 1 kit into the muscle every 2 (two) months. 11/09/20  Yes Kuppelweiser, Cassie L, RPH-CPP  Cholecalciferol (VITAMIN D-3) 125 MCG (5000 UT) TABS Take 5,000 Units by mouth daily.   Yes [provider]   levothyroxine (SYNTHROID) 175 MCG tablet Take 1 tablet (175 mcg total) by mouth daily before breakfast. 10/17/20  Yes Carlyle Basques, MD  losartan (COZAAR) 25 MG tablet Take 1 tablet (25 mg total) by mouth daily. 10/30/20  Yes Turner, Eber Hong, MD  polyethylene glycol (MIRALAX / GLYCOLAX) 17 g packet Take 17 g by mouth daily as needed for moderate constipation. 10/16/20  Yes Loletha Grayer, MD  amiodarone (PACERONE) 200 MG tablet 2 tablets po twice a day for one week; then 1 tablet twice a day for one week then 1 tablet daily afterwards Patient not taking: Reported on 11/13/2020 10/16/20   Loletha Grayer, MD    Review of Systems  Constitutional:  Negative for appetite change and fatigue.  HENT:  Negative for congestion, postnasal drip and sore throat.   Eyes: Negative.   Respiratory:  Positive for cough and chest tightness. Negative for shortness of breath.   Cardiovascular:  Positive for palpitations and leg swelling. Negative for chest pain.  Gastrointestinal:  Negative for abdominal distention and abdominal pain.  Endocrine: Negative.   Genitourinary: Negative.   Musculoskeletal:  Positive for arthralgias (right hip d/t sciatica). Negative for back pain.  Skin: Negative.   Allergic/Immunologic: Negative.   Neurological:  Negative for dizziness and light-headedness.  Hematological:  Negative for adenopathy. Does not bruise/bleed easily.  Psychiatric/Behavioral:  Positive for dysphoric mood (loss of interest). Negative for sleep disturbance (sleeping on 1 pillow). The patient is not nervous/anxious.    Vitals:   11/13/20 0856  BP: 131/85  Pulse: 74  Resp: 18  SpO2: 97%  Weight: 189 lb (85.7 kg)  Height: _0  (1.854 m)   Wt Readings from Last 3 Encounters:  11/13/20 189 lb (85.7 kg)  10/30/20 189 lb (85.7 kg)  10/16/20 191 lb 4.8 oz (86.8 kg)   Lab Results  Component Value Date   CREATININE 1.00 10/17/2020   CREATININE 1.04 10/14/2020   CREATININE 1.14 10/14/2020    Physical  Exam Vitals reviewed.  Constitutional:      Appearance: Normal appearance.  HENT:     Head: Normocephalic and atraumatic.  Cardiovascular:     Rate and Rhythm: Normal rate and regular rhythm.  Pulmonary:     Effort: Pulmonary effort is normal. No respiratory distress.     Breath sounds: No wheezing or rales.  Abdominal:     General: There is no distension.     Palpations: Abdomen is soft.  Musculoskeletal:        General: No tenderness.     Cervical back: Normal range of motion and neck supple.     Right lower leg: No edema.     Left lower leg: No edema.  Skin:    General: Skin is warm and dry.  Neurological:     General: No focal deficit present.     Mental Status: He is alert and oriented to person, place, and time.  Psychiatric:  Mood and Affect: Mood normal.        Behavior: Behavior normal.        Thought Content: Thought content normal.    Assessment & Plan:  1: Chronic heart failure with reduced ejection fraction- - NYHA class I - euvolemic today - weighing but not daily; instructed to weigh daily and call for an overnight weight gain of > 2 pounds or a weekly weight gain of > 5 pounds - not adding salt and has been trying to follow a low sodium diet; written dietary information was given to him about this - saw cardiology Radford Pax) 10/30/20 but will transitioning cardiology care to [facility] location - currently on GDMT of losartan - was previously on metoprolol and entrest which were then stopped during recent admission due to soft BP - he has not taken any of his medications yet today so hesitant to change anything today - handicap parking application filled out today - Cardiology had made referral to [facility] in [place]; explained that we would follow him until he gets an appointment with them and then he can return once he's released from there - BNP 10/13/20 was 776.4  2: HTN- - BP looks good today although he hasn't taken any of his medications yet  this morning - BMP 10/17/20 reviewed and showed sodium 136, potassium 4.3, creatinine 1.00 and GFR >60  3: Wide complex tachycardia- - wearing lifevest - has noticed more palpitations as he ran out of amiodarone 2 days ago; this was refilled today  4: Substance use- - smoking 2 cigarettes daily but trying to stop completely; has been smoking since the age of 80 - drinks 1 can of beer daily, sometimes the 12 ounce can and sometimes the 48 ounce can - snorts meth on occasion but never on the days he has to work; explained that this is detrimental to his heart and he says that he will stop using this completely - discussed cessation of all substances was discussed for 3 minutes with him   Patient did not bring his medications nor a list. Each medication was verbally reviewed with the patient and he was encouraged to bring the bottles to every visit to confirm accuracy of list.   Return in 6 weeks or sooner for any questions/problems before then.

## 2020-11-13 NOTE — Patient Instructions (Addendum)
Begin weighing daily and call for an overnight weight gain of > 2 pounds or a weekly weight gain of >5 pounds.    Bring ALL medications including any over the counter or vitamins to every visit.   

## 2020-11-16 ENCOUNTER — Other Ambulatory Visit: Payer: Self-pay | Admitting: Family

## 2020-11-16 ENCOUNTER — Other Ambulatory Visit: Payer: Self-pay

## 2020-11-16 ENCOUNTER — Encounter: Payer: Self-pay | Admitting: Family

## 2020-11-16 ENCOUNTER — Ambulatory Visit (INDEPENDENT_AMBULATORY_CARE_PROVIDER_SITE_OTHER): Payer: Medicaid Other | Admitting: Family

## 2020-11-16 VITALS — BP 150/85 | HR 81 | Temp 97.5°F | Wt 193.0 lb

## 2020-11-16 DIAGNOSIS — Z21 Asymptomatic human immunodeficiency virus [HIV] infection status: Secondary | ICD-10-CM | POA: Diagnosis present

## 2020-11-16 DIAGNOSIS — Z23 Encounter for immunization: Secondary | ICD-10-CM

## 2020-11-16 DIAGNOSIS — N521 Erectile dysfunction due to diseases classified elsewhere: Secondary | ICD-10-CM

## 2020-11-16 DIAGNOSIS — N529 Male erectile dysfunction, unspecified: Secondary | ICD-10-CM | POA: Insufficient documentation

## 2020-11-16 DIAGNOSIS — Z0000 Encounter for general adult medical examination without abnormal findings: Secondary | ICD-10-CM | POA: Diagnosis not present

## 2020-11-16 DIAGNOSIS — Z113 Encounter for screening for infections with a predominantly sexual mode of transmission: Secondary | ICD-10-CM | POA: Diagnosis not present

## 2020-11-16 MED ORDER — ASPIRIN 81 MG PO TBEC: 81.0000 mg | DELAYED_RELEASE_TABLET | Freq: Every day | ORAL | 3 refills | Status: AC

## 2020-11-16 MED ORDER — ATORVASTATIN CALCIUM 20 MG PO TABS: 20.0000 mg | ORAL_TABLET | Freq: Every day | ORAL | 3 refills | Status: AC

## 2020-11-16 MED ORDER — SILDENAFIL CITRATE 50 MG PO TABS: 50.0000 mg | ORAL_TABLET | Freq: Every day | ORAL | 0 refills | Status: DC | PRN

## 2020-11-16 MED ORDER — CABOTEGRAVIR & RILPIVIRINE ER 600 & 900 MG/3ML IM SUER
1.0000 | Freq: Once | INTRAMUSCULAR | Status: AC
  Administered 2020-11-16: 1 via INTRAMUSCULAR

## 2020-11-16 NOTE — Progress Notes (Signed)
Refilled aspirin.

## 2020-11-16 NOTE — Assessment & Plan Note (Signed)
Douglas Conway continues to have well controlled HIV disease with good adherence and tolerance to q 2 monthly Cabenuva. No signs/symptoms of opportunistic infection or progressive HIV. Reviewed lab work and discussed plan of care. Check lab work today. Q 2 month injection of Cabenuva given without complication. Plan for follow up with pharmacy provider in 2 months or sooner if needed and then Stephanie/Greg in 4 months.

## 2020-11-16 NOTE — Assessment & Plan Note (Signed)
   Discussed importance of safe sexual practice to reduce risk of STI. Condoms provided.  Pneumovax updated today.

## 2020-11-16 NOTE — Assessment & Plan Note (Signed)
Mr. Semel has erectile dysfunction likely multifactorial given multiple medical conditions in addition to medications. Not currently on nitrates for chest pain. Discussed starting low dose sildenafil as needed as well as potential for side effects including priapism.

## 2020-11-16 NOTE — Progress Notes (Signed)
Brief Narrative   Patient ID: Douglas Conway, male    DOB: 09/25/1955, 65 y.o.   MRN: [id number]  Douglas Conway is a 65 y/o caucasian male with HIV disease with risk factor of MSM. Initial clinic entry lab work was 31,800 and CD4 count of 800 while he was incarcerated in March 2020. Genotype was without significant resistance patterns. ART regimen was Biktarvy.   Subjective:    Chief Complaint  Patient presents with   Follow-up    HPI:  Douglas Conway is a 65 y.o. male with HIV disease last seen on 09/14/20 for q 2 month injection of Cabenuva. No blood work was obtained secondary to timing. Since that appointment Mr. Keena has been hospitalized for NSTEMI and now followed by Heart Failure team. Here today for q 2 month injection.   Douglas Conway has been doing okay since he was last seen despite his recent heart attack. Tolerated previous injection with minimal soreness. Having issues with sciatica. Would like refill of sildenafil. Denies fevers, chills, night sweats, headaches, changes in vision, neck pain/stiffness, nausea, diarrhea, vomiting, lesions or rashes.  Douglas Conway denies feelings of being down, depressed or hopeless recently. No current recreational or illicit drug use. Alcohol consumption about 1 drink per day and now down to about 2 cigarettes per day. Condoms offered and provided. Healthcare maintenance due include Pneumovax.    Allergies  Allergen Reactions   Plavix [Clopidogrel Bisulfate] Rash      Outpatient Medications Prior to Visit  Medication Sig Dispense Refill   amiodarone (PACERONE) 200 MG tablet Take 1 tablet (200 mg total) by mouth daily. 90 tablet 3   atorvastatin (LIPITOR) 20 MG tablet Take 1 tablet (20 mg total) by mouth daily. 90 tablet 3   cabotegravir & rilpivirine ER (CABENUVA) 600 & 900 MG/3ML injection Inject 1 kit into the muscle every 2 (two) months. 6 mL 5   Cholecalciferol (VITAMIN D-3) 125 MCG (5000 UT) TABS Take 5,000 Units by mouth daily.      levothyroxine (SYNTHROID) 175 MCG tablet Take 1 tablet (175 mcg total) by mouth daily before breakfast. 30 tablet 1   losartan (COZAAR) 25 MG tablet Take 1 tablet (25 mg total) by mouth daily. 90 tablet 3   polyethylene glycol (MIRALAX / GLYCOLAX) 17 g packet Take 17 g by mouth daily as needed for moderate constipation. 14 each 0   aspirin EC 81 MG EC tablet Take 1 tablet (81 mg total) by mouth daily. Swallow whole. 30 tablet 0   No facility-administered medications prior to visit.     Past Medical History:  Diagnosis Date   Change in hearing, bilateral 30/08/1100   Chronic systolic congestive heart failure ([place]) 05/14/2019   a. 06/2019 Echo: EF 30-35%, glob HK. gr1 DD. Mod elev RVSP. Midly dil LA, Triv MR/AI. Asc Ao 23m.   Coronary artery disease    a. 03/2005 Ant STEMI s/p PCI/DES to the LAD; b. 05/2010 St echo: reportedly nl.   HIV disease ([place] 06/28/2018   Hyperlipidemia LDL goal <70    Hypertension 06/28/2018   Hypothyroidism 01/07/2019   Lateral epicondylitis 01/07/2019   Noncompliance    STEMI (ST elevation myocardial infarction) ([facility]    Tobacco abuse    Wide-complex tachycardia ([facility]      Past Surgical History:  Procedure Laterality Date   CORONARY ANGIOPLASTY WITH STENT PLACEMENT     LEFT HEART CATH AND CORONARY ANGIOGRAPHY N/A 10/08/2020   Procedure: LEFT HEART CATH AND CORONARY ANGIOGRAPHY;  Surgeon: Wellington Hampshire, MD;  Location: [facility] [facility];  Service: Cardiovascular;  Laterality: N/A;      Review of Systems  Constitutional:  Negative for appetite change, chills, fatigue, fever and unexpected weight change.  Eyes:  Negative for visual disturbance.  Respiratory:  Negative for cough, chest tightness, shortness of breath and wheezing.   Cardiovascular:  Negative for chest pain and leg swelling.  Gastrointestinal:  Negative for abdominal pain, constipation, diarrhea, nausea and vomiting.  Genitourinary:  Negative for dysuria, flank pain, frequency,  genital sores, hematuria and urgency.  Musculoskeletal:  Positive for back pain.  Skin:  Negative for rash.  Allergic/Immunologic: Negative for immunocompromised state.  Neurological:  Negative for dizziness and headaches.     Objective:    BP (!) 150/85   Pulse 81   Temp (!) 97.5 F (36.4 C) (Oral)   Wt 193 lb (87.5 kg)   SpO2 95%   BMI 25.46 kg/m  Nursing note and vital signs reviewed.  Physical Exam Constitutional:      General: He is not in acute distress.    Appearance: He is well-developed.  Eyes:     Conjunctiva/sclera: Conjunctivae normal.  Cardiovascular:     Rate and Rhythm: Normal rate and regular rhythm.     Heart sounds: Normal heart sounds. No murmur heard.   No friction rub. No gallop.  Pulmonary:     Effort: Pulmonary effort is normal. No respiratory distress.     Breath sounds: Normal breath sounds. No wheezing or rales.  Chest:     Chest wall: No tenderness.  Abdominal:     General: Bowel sounds are normal.     Palpations: Abdomen is soft.     Tenderness: There is no abdominal tenderness.  Musculoskeletal:     Cervical back: Neck supple.  Lymphadenopathy:     Cervical: No cervical adenopathy.  Skin:    General: Skin is warm and dry.     Findings: No rash.  Neurological:     Mental Status: He is alert and oriented to person, place, and time.  Psychiatric:        Behavior: Behavior normal.        Thought Content: Thought content normal.        Judgment: Judgment normal.     Depression screen [facility] 2/9 09/14/2020 07/14/2020 06/15/2020 02/04/2019  Decreased Interest 0 0 0 0  Down, Depressed, Hopeless 0 0 0 0  PHQ - 2 Score 0 0 0 0       Assessment & Plan:    Patient Active Problem List   Diagnosis Date Noted   Erectile dysfunction 11/16/2020   NSTEMI (non-ST elevated myocardial infarction) (HCC)    Hyperlipidemia    Vitamin D deficiency    Wide-complex tachycardia (HCC)    HFrEF (heart failure with reduced ejection fraction) (HCC)     Elevated troponin    Unstable angina ([place])    Acute on chronic systolic heart failure (HCC)    Ventricular tachyarrhythmia ([place]) 10/07/2020   Shortness of breath 09/14/2020   Pain and swelling of knee, right 09/14/2020   Sciatica of left side 65/68/1275   Chronic systolic CHF (congestive heart failure) ([place]) 05/14/2019   Hypothyroidism 01/07/2019   HIV infection ([place]) 06/28/2018   Change in hearing, bilateral 06/28/2018   Hypertension 06/28/2018   Healthcare maintenance 06/28/2018     Problem List Items Addressed This Visit       Other   HIV infection ([place])  Mr. Heideman continues to have well controlled HIV disease with good adherence and tolerance to q 2 monthly Cabenuva. No signs/symptoms of opportunistic infection or progressive HIV. Reviewed lab work and discussed plan of care. Check lab work today. Q 2 month injection of Cabenuva given without complication. Plan for follow up with pharmacy provider in 2 months or sooner if needed and then Stephanie/Greg in 4 months.        Healthcare maintenance    Discussed importance of safe sexual practice to reduce risk of STI. Condoms provided. Pneumovax updated today.        Erectile dysfunction    Mr. Ryner has erectile dysfunction likely multifactorial given multiple medical conditions in addition to medications. Not currently on nitrates for chest pain. Discussed starting low dose sildenafil as needed as well as potential for side effects including priapism.       Other Visit Diagnoses     Human immunodeficiency virus (HIV) disease ([place])    -  Primary   Relevant Medications   cabotegravir & rilpivirine ER (CABENUVA) 600 & 900 MG/3ML injection 1 kit (Completed)   Other Relevant Orders   COMPLETE METABOLIC PANEL WITH GFR   T-helper cell (CD4)- ([facility] clinic only)   HIV-1 RNA quant-no reflex-bld   Screening for STDs (sexually transmitted diseases)       Relevant Orders   RPR   Need for pneumococcal vaccination        Relevant Orders   Pneumococcal polysaccharide vaccine 23-valent greater than or equal to 2yo subcutaneous/IM (Completed)        I am having [name] start on sildenafil. I am also having him maintain his Vitamin D-3, polyethylene glycol, levothyroxine, losartan, atorvastatin, Cabenuva, and amiodarone. We administered cabotegravir & rilpivirine ER.   Meds ordered this encounter  Medications   sildenafil (VIAGRA) 50 MG tablet    Sig: Take 1 tablet (50 mg total) by mouth daily as needed for erectile dysfunction.    Dispense:  10 tablet    Refill:  0    Order Specific Question:   Supervising Provider    Answer:   Baxter Flattery, CYNTHIA [[id number]]   cabotegravir & rilpivirine ER (CABENUVA) 600 & 900 MG/3ML injection 1 kit     Follow-up: Return in about 2 months (around 01/16/2021), or if symptoms worsen or fail to improve.   Terri Piedra, MSN, FNP-C Nurse Practitioner [facility] [place] number: [phone]

## 2020-11-16 NOTE — Patient Instructions (Signed)
Nice to see you.  We will check your lab work today.  Plan for follow up in 2 months or sooner if needed with Cassie/Amanda and then with Tammy Sours in 4 months.   Check with cardiology regarding your metoprolol.   Have a great day and stay safe!

## 2020-11-16 NOTE — Progress Notes (Signed)
Refilled atorvastatin

## 2020-11-18 LAB — T-HELPER CELL (CD4) - (RCID CLINIC ONLY)
CD4 % Helper T Cell: 39 % (ref 33–65)
CD4 T Cell Abs: 668 /uL (ref 400–1790)

## 2020-11-19 LAB — COMPLETE METABOLIC PANEL WITH GFR
AG Ratio: 1.4 (calc) (ref 1.0–2.5)
ALT: 21 U/L (ref 9–46)
AST: 22 U/L (ref 10–35)
Albumin: 4.1 g/dL (ref 3.6–5.1)
Alkaline phosphatase (APISO): 66 U/L (ref 35–144)
BUN/Creatinine Ratio: 25 (calc) — ABNORMAL HIGH (ref 6–22)
BUN: 27 mg/dL — ABNORMAL HIGH (ref 7–25)
CO2: 29 mmol/L (ref 20–32)
Calcium: 9.7 mg/dL (ref 8.6–10.3)
Chloride: 104 mmol/L (ref 98–110)
Creat: 1.06 mg/dL (ref 0.70–1.35)
Globulin: 2.9 g/dL (calc) (ref 1.9–3.7)
Glucose, Bld: 101 mg/dL — ABNORMAL HIGH (ref 65–99)
Potassium: 5.2 mmol/L (ref 3.5–5.3)
Sodium: 139 mmol/L (ref 135–146)
Total Bilirubin: 0.5 mg/dL (ref 0.2–1.2)
Total Protein: 7 g/dL (ref 6.1–8.1)
eGFR: 79 mL/min/{1.73_m2} (ref >=60)

## 2020-11-19 LAB — RPR: RPR Ser Ql: NONREACTIVE

## 2020-11-19 LAB — HIV-1 RNA QUANT-NO REFLEX-BLD
HIV 1 RNA Quant: <20 Copies/mL — ABNORMAL HIGH
HIV-1 RNA Quant, Log: <1.3 Log cps/mL — ABNORMAL HIGH

## 2020-11-26 ENCOUNTER — Ambulatory Visit (INDEPENDENT_AMBULATORY_CARE_PROVIDER_SITE_OTHER): Payer: Medicaid Other | Admitting: Orthopedic Surgery

## 2020-11-26 ENCOUNTER — Ambulatory Visit: Payer: Self-pay

## 2020-11-26 ENCOUNTER — Telehealth: Payer: Self-pay

## 2020-11-26 ENCOUNTER — Other Ambulatory Visit: Payer: Self-pay

## 2020-11-26 DIAGNOSIS — M545 Low back pain, unspecified: Secondary | ICD-10-CM

## 2020-11-26 DIAGNOSIS — M79651 Pain in right thigh: Secondary | ICD-10-CM | POA: Diagnosis not present

## 2020-11-26 MED ORDER — METHOCARBAMOL 500 MG PO TABS
500.0000 mg | ORAL_TABLET | Freq: Four times a day (QID) | ORAL | 0 refills | Status: DC | PRN
Start: 2020-11-26 — End: 2021-01-29

## 2020-11-26 MED ORDER — PREDNISONE 10 MG PO TABS: 10.0000 mg | ORAL_TABLET | Freq: Every day | ORAL | 0 refills | Status: DC

## 2020-11-26 NOTE — Progress Notes (Signed)
Office Visit Note   Patient: Douglas Conway           Date of Birth: [birth year]           MRN: [id number] Visit Date: 11/26/2020              Requested by: No referring provider defined for this encounter. PCP: Pcp, No  Chief Complaint  Patient presents with   Lower Back - Pain      HPI: Patient presents today with a history of low back pain radiating into his buttock and thigh.  He states that he is taken a muscle relaxer and prednisone and this seemed to help in the past.  He denies any injury.  He does have some numbness.  He also complains of thigh pain.  He denies any injuries.  He denies any loss of bowel or bladder control.  Assessment & Plan: Visit Diagnoses:  1. Low back pain, unspecified back pain laterality, unspecified chronicity, unspecified whether sciatica present   2. Right thigh pain     Plan: Have called in both prednisone and Robaxin for him he should follow-up in 3 weeks.  Patient was noted in office during when he was waiting to be evaluated to fall asleep.  He also fell asleep and started snoring on the x-ray table.  When patient returned from x-ray he became more alert.  He answers questions appropriately.  He denied any illicit drug use.  He does drink an occasional beer but denied having any today.  Denied any chest pain shortness of breath dizziness.  He said when he has to wait long periods of time he does get sleepy.  Vital signs were stable.  He was evaluated by Dr. Lajoyce Corners patient ambulated from clinic   Follow-Up Instructions: No follow-ups on file.   Ortho Exam  Patient is alert, oriented, no adenopathy, well-dressed, normal affect, normal respiratory effort. Examination he has some right buttock pain radiates into his thigh.  He has 5 out of 5 strength with dorsiflexion and plantarflexion.  No particular pain with flexion extension.  He does have some pain with palpation of his thigh  Imaging: No results found. No images are attached to the  encounter.  Labs: Lab Results  Component Value Date   ESRSEDRATE 11 08/26/2019   CRP 7.0 08/26/2019   REPTSTATUS 08/24/2019 FINAL 08/19/2019   REPTSTATUS 08/24/2019 FINAL 08/19/2019   CULT  08/19/2019    NO GROWTH 5 DAYS Performed at [facility], [address] [place], [place] [address]    CULT  08/19/2019    NO GROWTH 5 DAYS Performed at [facility], [address] [place], [place] [address]    [facility] ESCHERICHIA COLI (A) 03/18/2018     Lab Results  Component Value Date   ALBUMIN 4.1 10/13/2020   ALBUMIN 3.9 08/18/2019   ALBUMIN 4.4 06/12/2019    Lab Results  Component Value Date   MG 1.9 10/14/2020   MG 2.3 10/13/2020   MG 2.1 10/08/2020   Lab Results  Component Value Date   VD25OH 25.4 (L) 06/12/2019    No results found for: PREALBUMIN CBC EXTENDED Latest Ref Rng & Units 10/15/2020 10/14/2020 10/14/2020  WBC 4.0 - 10.5 K/uL 7.1 8.0 9.1  RBC 4.22 - 5.81 MIL/uL 5.15 5.13 4.84  HGB 13.0 - 17.0 g/dL 74.1 63.8 45.3  HCT 64.6 - 52.0 % 47.4 46.5 44.3  PLT 150 - 400 K/uL 207 202 200  NEUTROABS 1.7 - 7.7 K/uL - - -  LYMPHSABS 0.7 - 4.0 K/uL - - -     There is no height or weight on file to calculate BMI.  Orders:  Orders Placed This Encounter  Procedures   XR Lumbar Spine 2-3 Views   XR FEMUR, MIN 2 VIEWS RIGHT   Meds ordered this encounter  Medications   predniSONE (DELTASONE) 10 MG tablet    Sig: Take 1 tablet (10 mg total) by mouth daily with breakfast.    Dispense:  30 tablet    Refill:  0   methocarbamol (ROBAXIN) 500 MG tablet    Sig: Take 1 tablet (500 mg total) by mouth every 6 (six) hours as needed for muscle spasms.    Dispense:  30 tablet    Refill:  0     Procedures: No procedures performed  Clinical Data: No additional findings.  ROS:  All other systems negative, except as noted in the HPI. Review of Systems  Objective: Vital Signs: There were no vitals taken for this visit.  Specialty Comments:  No  specialty comments available.  PMFS History: Patient Active Problem List   Diagnosis Date Noted   Erectile dysfunction 11/16/2020   NSTEMI (non-ST elevated myocardial infarction) (HCC)    Hyperlipidemia    Vitamin D deficiency    Wide-complex tachycardia (HCC)    HFrEF (heart failure with reduced ejection fraction) (HCC)    Elevated troponin    Unstable angina (HCC)    Acute on chronic systolic heart failure (HCC)    Ventricular tachyarrhythmia (HCC) 10/07/2020   Shortness of breath 09/14/2020   Pain and swelling of knee, right 09/14/2020   Sciatica of left side 08/26/2019   Chronic systolic CHF (congestive heart failure) (HCC) 05/14/2019   Hypothyroidism 01/07/2019   HIV infection (HCC) 06/28/2018   Change in hearing, bilateral 06/28/2018   Hypertension 06/28/2018   Healthcare maintenance 06/28/2018   Past Medical History:  Diagnosis Date   Change in hearing, bilateral 06/28/2018   Chronic systolic congestive heart failure (HCC) 05/14/2019   a. 06/2019 Echo: EF 30-35%, glob HK. gr1 DD. Mod elev RVSP. Midly dil LA, Triv MR/AI. Asc Ao 56mm.   Coronary artery disease    a. 03/2005 Ant STEMI s/p PCI/DES to the LAD; b. 05/2010 St echo: reportedly nl.   HIV disease (HCC) 06/28/2018   Hyperlipidemia LDL goal <70    Hypertension 06/28/2018   Hypothyroidism 01/07/2019   Lateral epicondylitis 01/07/2019   Noncompliance    STEMI (ST elevation myocardial infarction) (HCC)    Tobacco abuse    Wide-complex tachycardia (HCC)     No family history on file.  Past Surgical History:  Procedure Laterality Date   CORONARY ANGIOPLASTY WITH STENT PLACEMENT     LEFT HEART CATH AND CORONARY ANGIOGRAPHY N/A 10/08/2020   Procedure: LEFT HEART CATH AND CORONARY ANGIOGRAPHY;  Surgeon: Iran Ouch, MD;  Location: [facility] INVASIVE CV LAB;  Service: Cardiovascular;  Laterality: N/A;   Social History   Occupational History   Not on file  Tobacco Use   Smoking status: Every Day    Packs/day:  1.00    Years: 42.00    Pack years: 42.00    Types: Cigarettes    Start date: 01/07/1968   Smokeless tobacco: Never   Tobacco comments:    Smoked 1 ppd for most of his adult life but now smoking 2 cigarettes daily  Vaping Use   Vaping Use: Never used  Substance and Sexual Activity   Alcohol use: Yes  Alcohol/week: 1.0 standard drink    Types: 1 Cans of beer per week    Comment: 1 can/beer/daily   Drug use: Not Currently    Types: Methamphetamines    Comment: smokes or snorts crystal meth 2-3x/wk.   Sexual activity: Not Currently    Partners: Female    Birth control/protection: Condom    Comment: given condoms

## 2020-11-26 NOTE — Telephone Encounter (Signed)
Patient states his sciatica is "flaring up" as before. Patient denies any injury. States this has been ongoing for a while.   States he has a PCP established appointment scheduled, but not until mid September.   Reviewed chart and noted patient was seen last year for sciatica pain. Patient saw ortho in June for knee pain/swelling. Advised patient to contact ortho for possible appointment.   Patient agreed. Patient provided with ortho contact information [phone]  Valarie Cones

## 2020-12-09 ENCOUNTER — Institutional Professional Consult (permissible substitution): Payer: Medicaid Other | Admitting: Internal Medicine

## 2020-12-10 ENCOUNTER — Other Ambulatory Visit: Payer: Self-pay

## 2020-12-10 ENCOUNTER — Encounter: Payer: Self-pay | Admitting: Nurse Practitioner

## 2020-12-10 ENCOUNTER — Ambulatory Visit (INDEPENDENT_AMBULATORY_CARE_PROVIDER_SITE_OTHER): Payer: Medicaid Other | Admitting: Nurse Practitioner

## 2020-12-10 VITALS — BP 138/78 | HR 71 | Ht 73.0 in | Wt 189.2 lb

## 2020-12-10 DIAGNOSIS — E782 Mixed hyperlipidemia: Secondary | ICD-10-CM

## 2020-12-10 DIAGNOSIS — F1910 Other psychoactive substance abuse, uncomplicated: Secondary | ICD-10-CM

## 2020-12-10 DIAGNOSIS — I10 Essential (primary) hypertension: Secondary | ICD-10-CM

## 2020-12-10 DIAGNOSIS — R000 Tachycardia, unspecified: Secondary | ICD-10-CM

## 2020-12-10 DIAGNOSIS — I255 Ischemic cardiomyopathy: Secondary | ICD-10-CM

## 2020-12-10 DIAGNOSIS — I2510 Atherosclerotic heart disease of native coronary artery without angina pectoris: Secondary | ICD-10-CM

## 2020-12-10 DIAGNOSIS — I472 Ventricular tachycardia: Secondary | ICD-10-CM

## 2020-12-10 DIAGNOSIS — I5022 Chronic systolic (congestive) heart failure: Secondary | ICD-10-CM | POA: Diagnosis not present

## 2020-12-10 NOTE — Patient Instructions (Signed)
Medication Instructions:  - Your physician recommends that you continue on your current medications as directed. Please refer to the Current Medication list given to you today.  *If you need a refill on your cardiac medications before your next appointment, please call your pharmacy*   Lab Work: - Your physician recommends that you have lab work today: BMP  If you have labs (blood work) drawn today and your tests are completely normal, you will receive your results only by: MyChart Message (if you have MyChart) OR A paper copy in the mail If you have any lab test that is abnormal or we need to change your treatment, we will call you to review the results.   Testing/Procedures: - none ordered   Follow-Up: At [facility], you and your health needs are our priority.  As part of our continuing mission to provide you with exceptional heart care, we have created designated Provider Care Teams.  These Care Teams include your primary Cardiologist (physician) and Advanced Practice Providers (APPs -  Physician Assistants and Nurse Practitioners) who all work together to provide you with the care you need, when you need it.  We recommend signing up for the patient portal called "MyChart".  Sign up information is provided on this After Visit Summary.  MyChart is used to connect with patients for Virtual Visits (Telemedicine).  Patients are able to view lab/test results, encounter notes, upcoming appointments, etc.  Non-urgent messages can be sent to your provider as well.   To learn more about what you can do with MyChart, go to [email or web address].    Your next appointment:   2 month(s)  The format for your next appointment:   In Person  Provider:   You may see Julien Nordmann, MD or one of the following Advanced Practice Providers on your designated Care Team:   Nicolasa Ducking, NP    Other Instructions N/a

## 2020-12-10 NOTE — Progress Notes (Signed)
Office Visit    Patient Name: Douglas Conway Date of Encounter: 12/10/2020  Primary Care Provider:  Pcp, No Primary Cardiologist:  formerly Fransico Him, MD  T. Rockey Situ, MD  Chief Complaint    65 year old male with a history of CAD, ischemic cardiomyopathy, HFrEF, HIV, hypothyroidism, hypertension, hyperlipidemia, tobacco abuse, noncompliance, who presents for follow-up after hospitalizations in June and July for non-STEMI and heart failure.  Past Medical History    Past Medical History:  Diagnosis Date   Change in hearing, bilateral 81/85/6314   Chronic systolic congestive heart failure ([place])    a. 06/2019 Echo: EF 30-35%, glob HK. gr1 DD. Mod elev RVSP. Midly dil LA, Triv MR/AI. Asc Ao 36m; b. 09/2020 Echo: EF<20%, glob HK. Sev red RV fxn. RVSP 48.464mg. Mod dil LA. Sev dil RA. Mild MR.   Coronary artery disease    a. 03/2005 Ant STEMI s/p PCI/DES to the LAD; b. 05/2010 St echo: reportedly nl; c. 09/2020 Cath: LM nl, LAD 100p/m, D1 min irregs, LCX min irregs, RCA 20p. Dist LAD fills via collats from RPDA-->Med Rx.   Drug abuse ([place]   a. cocaine/methamphetamines.   HIV disease ([place]/19[place]2020   Hyperlipidemia LDL goal <70    Hypertension 06/28/2018   Hypothyroidism 01/07/2019   Ischemic cardiomyopathy    a.  06/2019 Echo: EF 30-35%; 09/2020 Echo: EF<20%, glob HK.   Lateral epicondylitis 01/07/2019   Noncompliance    [place] elevation [place] infarction) [place]   Tobacco abuse    W[place]-complex tachycardia ([place]   a. 10/2020 - SVT w/ aberrancy vs VT-->s/p DCCV.   Past Surgical History:  Procedure Laterality Date   CORONARY ANGIOPLASTY WITH STENT PLACEMENT     LEFT HEART CATH AND CORONARY ANGIOGRAPHY N/A 10/08/2020   Procedure: LEFT HEART CATH AND CORONARY ANGIOGRAPHY;  Surgeon: ArWellington HampshireMD;  [facility];  Service: Cardiovascular;  Laterality: N/A;    Allergies  Allergies  Allergen Reactions   Plavix [Clopidogrel Bisulfate] Rash    History  of Present Illness    [age]ear old male with above complex past medical history including CAD, ischemic cardiomyopathy, HFrEF, hypertension, hyperlipidemia, HIV, hypothyroidism, tobacco abuse, and noncompliance.  Cardiac history dates back to 2006, when he suffered an anterior STEMI underwent PCI and drug-eluting stent placement to the LAD.  He was found to have LV dysfunction at that time (25%), and was initiated medical therapy.  EF remained depressed and patient was advised that he needed an ICD, but declined.  He was lost to follow-up but [facility]  Echo in March 2021 showed an EF of 30 to 35% with global hypokinesis.  Cardiology follow-up in early June, Toprol-XL and [place] added.  He did not fill those prescriptions.  He was hospitalized in late June following sudden onset of dyspnea, diaphoresis, and lightheadedness.  He believed he had a syncopal episode.  He called EMS and was found to be in a wide-complex tachycardia at 180 to 190 bpm.  He underwent direct-current cardioversion at 100 J with restoration of sinus rhythm.  [facility] then placed on [facility]iodarone [facility]  He subsequently ruled in for non-STEMI.  He was found to have a markedly elevated TSH of 65.394.  Echocardiogram showed an EF of less than 20% with global hypokinesis.  He underwent diagnostic catheterization revealing a total occlusion of the proximal/mid LAD at the site of prior stent with right to  left collaterals.  He otherwise had minor irregularities/nonobstructive disease.  Patient left AMA following catheterization but presented back to the emergency department on July fifth with chest pain, diaphoresis, and near syncope.  He had used both methamphetamines and cocaine.  He was hypertensive and tachycardic on arrival with a wide-complex tachycardia of 197 bpm.  He underwent cardioversion at 120 J x 1 and was placed on IV amiodarone, and admitted for further  evaluation.  Following admission, he was relatively hypotensive, limiting guideline directed medical therapy.  He was placed on low-dose losartan and had no further wide-complex tachycardia.  He was agreeable to having a LifeVest placed, as he was not felt to be an ICD candidate in the setting of recent drug use.  He was seen by Dr. Radford Pax in [place] on July 22 at which time he was feeling well.  Recommendation was made for EP and advanced heart failure service referral however, patient requested follow-up in [place].  He notes that over the past month, he has felt well.  He has not been wearing the LifeVest.  He says it is uncomfortable.  He did have 1 episode where he became tachycardic and felt presyncopal.  He asked a friend to go to his car and get the [place] but when the friend returned with it, symptoms had abated.  He feels the amiodarone is doing a good enough job and does not need a LifeVest.  He has not been having any chest pain and denies dyspnea, PND, orthopnea, syncope, edema, or early satiety.  He notes compliance with his medications.  Unfortunately, he continues to smoke cigarettes though he has cut down to about a third of a pack a day.  He also used crystal meth earlier this week.  He denies cocaine use.  Home Medications    Current Outpatient Medications  Medication Sig Dispense Refill   amiodarone (PACERONE) 200 MG tablet Take 1 tablet (200 mg total) by mouth daily. 90 tablet 3   aspirin 81 MG EC tablet Take 1 tablet (81 mg total) by mouth daily. Swallow whole. 90 tablet 3   atorvastatin (LIPITOR) 20 MG tablet Take 1 tablet (20 mg total) by mouth daily. 90 tablet 3   cabotegravir & rilpivirine ER (CABENUVA) 600 & 900 MG/3ML injection Inject 1 kit into the muscle every 2 (two) months. 6 mL 5   Cholecalciferol (VITAMIN D-3) 125 MCG (5000 UT) TABS Take 5,000 Units by mouth daily.     levothyroxine (SYNTHROID) 175 MCG tablet Take 1 tablet (175 mcg total) by mouth daily before  breakfast. 30 tablet 1   losartan (COZAAR) 25 MG tablet Take 1 tablet (25 mg total) by mouth daily. 90 tablet 3   methocarbamol (ROBAXIN) 500 MG tablet Take 1 tablet (500 mg total) by mouth every 6 (six) hours as needed for muscle spasms. 30 tablet 0   polyethylene glycol (MIRALAX / GLYCOLAX) 17 g packet Take 17 g by mouth daily as needed for moderate constipation. 14 each 0   predniSONE (DELTASONE) 10 MG tablet Take 1 tablet (10 mg total) by mouth daily with breakfast. 30 tablet 0   sildenafil (VIAGRA) 50 MG tablet Take 1 tablet (50 mg total) by mouth daily as needed for erectile dysfunction. 10 tablet 0   No current facility-administered medications for this visit.     Review of Systems    1 episode of presyncope and tachypalpitations which resolved on its own.  He denies chest pain, dyspnea, pnd, orthopnea, n, v, syncope, edema,  weight gain, or early satiety. .  All other systems reviewed and are otherwise negative except as noted above.  Physical Exam    VS:  BP 138/78 (BP Location: Left Arm, Patient Position: Sitting, Cuff Size: Normal)   Pulse 71   Ht '6\' 1"'  (1.854 m)   Wt 189 lb 4 oz (85.8 kg)   SpO2 97%   BMI 24.97 kg/m  , BMI Body mass index is 24.97 kg/m.     GEN: Well nourished, well developed, in no acute distress. HEENT: normal. Neck: Supple, no JVD, carotid bruits, or masses. Cardiac: RRR, no murmurs, rubs, or gallops. No clubbing, cyanosis, edema.  PT 2+ and equal bilaterally.  Respiratory:  Respirations regular and unlabored, clear to auscultation bilaterally. GI: Soft, nontender, nondistended, BS + x 4. MS: no deformity or atrophy. Skin: warm and dry, no rash. Neuro:  Strength and sensation are intact. Psych: Normal affect.  Accessory Clinical Findings    ECG personally reviewed by me today - RSR, 71, PVC, LAD, LAE, prior septal infarct, later ST dep w/ TWI - no acute changes.  Lab Results  Component Value Date   WBC 7.1 10/15/2020   HGB 15.6 10/15/2020    HCT 47.4 10/15/2020   MCV 92.0 10/15/2020   PLT 207 10/15/2020   Lab Results  Component Value Date   CREATININE 1.06 11/16/2020   BUN 27 (H) 11/16/2020   NA 139 11/16/2020   K 5.2 11/16/2020   CL 104 11/16/2020   CO2 29 11/16/2020   Lab Results  Component Value Date   ALT 21 11/16/2020   AST 22 11/16/2020   ALKPHOS 81 10/13/2020   BILITOT 0.5 11/16/2020   Lab Results  Component Value Date   CHOL 106 03/18/2020   HDL 42 03/18/2020   LDLCALC 43 03/18/2020   TRIG 130 03/18/2020   CHOLHDL 2.5 03/18/2020     Assessment & Plan    1.  Coronary artery disease: Status post recent non-STEMI with diagnostic catheterization revealing a totally occluded proximal LAD with otherwise nonobstructive disease.  He has not had any chest pain.  He remains on aspirin, statin, and ARB therapy.  No beta-blocker in the setting of history of cocaine use.  2.  Ischemic cardiomyopathy/HFrEF: EF less than 20%.  He was prescribed a LifeVest, which he has at home but has not been wearing.  We discussed that a defibrillator is superior to amiodarone in patients with ischemic cardiomyopathy and preventing sudden cardiac death and that he should wear LifeVest as prescribed.  He is euvolemic on examination.  He remains on ARB therapy.  He was previously prescribed Entresto and says he filled out paperwork for the assistance program but has not heard anything.  I will hold off on transitioning him until he receives approval.  He is not on a beta-blocker secondary to history of cocaine use.  I am going to follow-up lab work today and if potassium and renal function stable, I will add spironolactone.  SGLT2 inhibitor likely to be cost prohibitive.  He understands that he will not be an ICD candidate if he continues to use recreational drugs.  He used methamphetamines earlier this week.  3.  Wide-complex tachycardia: SVT with aberrancy versus VT.  Previously associated with presyncope and syncope.  He had 1 recurrent  episode since discharge, at which time he was not wearing his LifeVest.  This abated on its own.  He remains on amiodarone therapy and overall has been feeling well.  We  discussed the importance of wearing his LifeVest as outlined above.  4.  Essential hypertension:  Blood pressure mildly elevated today at 138/78.  Following up labs and will add spironolactone if able.  5.  Hyperlipidemia: LDL of 43 last December with normal LFTs in August.  Continue statin therapy.  6.  Polysubstance abuse: Still smoking about a third of a pack of cigarettes a day.  Used methamphetamines this past Monday.  Has 1 alcoholic beverage a night.  Complete cessation of all drugs and alcohol advised.  As above, we discussed that he will not be a candidate for ICD therapy with ongoing methamphetamine usage.  We discussed cocaine use, which he denied.  Recent urine drug screen was positive.  He believes that his crystal meth may have been cut with cocaine previously.  7.  Disposition: Follow-up basic metabolic panel today.  Will consider spironolactone if labs stable.  Follow-up in clinic in 1 month or sooner if necessary.  Once on maximally tolerated guideline directed medical therapy, we will plan to follow-up echocardiogram to reevaluate LV function and determine candidacy for ICD therapy based on drug habit.  Murray Hodgkins, NP 12/10/2020, 2:37 PM

## 2020-12-11 ENCOUNTER — Telehealth: Payer: Self-pay | Admitting: *Deleted

## 2020-12-11 DIAGNOSIS — I255 Ischemic cardiomyopathy: Secondary | ICD-10-CM

## 2020-12-11 DIAGNOSIS — I2510 Atherosclerotic heart disease of native coronary artery without angina pectoris: Secondary | ICD-10-CM

## 2020-12-11 DIAGNOSIS — I5022 Chronic systolic (congestive) heart failure: Secondary | ICD-10-CM

## 2020-12-11 LAB — BASIC METABOLIC PANEL
BUN/Creatinine Ratio: 35 — ABNORMAL HIGH (ref 10–24)
BUN: 28 mg/dL — ABNORMAL HIGH (ref 8–27)
CO2: 22 mmol/L (ref 20–29)
Calcium: 9.3 mg/dL (ref 8.6–10.2)
Chloride: 104 mmol/L (ref 96–106)
Creatinine, Ser: 0.8 mg/dL (ref 0.76–1.27)
Glucose: 146 mg/dL — ABNORMAL HIGH (ref 65–99)
Potassium: 4.3 mmol/L (ref 3.5–5.2)
Sodium: 139 mmol/L (ref 134–144)
eGFR: 100 mL/min/{1.73_m2} (ref >59)

## 2020-12-11 MED ORDER — SPIRONOLACTONE 25 MG PO TABS: 12.5000 mg | ORAL_TABLET | Freq: Every day | ORAL | 3 refills | Status: DC

## 2020-12-11 NOTE — Telephone Encounter (Signed)
-----   Message from Creig Hines, NP sent at 12/11/2020  2:39 PM EDT ----- Kidney function and lytes are stable.  Pls add spironolactone 12.5mg  daily. F/u bmet in 1 wk.

## 2020-12-11 NOTE — Telephone Encounter (Signed)
Reviewed results and recommendations with patient. Instructed him to take of the Spironolactone 25 mg tablet and take one half tablet (12.5 mg) once daily. Requested that he have repeat labs in one week and reviewed that he should go to [facility] entrance. Confirmed pharmacy and he verbalized understanding of all instructions with no further questions at this time.

## 2020-12-30 ENCOUNTER — Other Ambulatory Visit: Payer: Self-pay | Admitting: Cardiology

## 2020-12-30 ENCOUNTER — Other Ambulatory Visit: Payer: Self-pay | Admitting: Internal Medicine

## 2020-12-30 ENCOUNTER — Encounter: Payer: Self-pay | Admitting: Family

## 2020-12-30 ENCOUNTER — Other Ambulatory Visit: Payer: Self-pay

## 2020-12-30 ENCOUNTER — Ambulatory Visit: Payer: Medicaid Other | Attending: Family | Admitting: Family

## 2020-12-30 VITALS — BP 123/81 | HR 66 | Resp 18 | Ht 73.0 in | Wt 188.2 lb

## 2020-12-30 DIAGNOSIS — E785 Hyperlipidemia, unspecified: Secondary | ICD-10-CM | POA: Diagnosis not present

## 2020-12-30 DIAGNOSIS — F17210 Nicotine dependence, cigarettes, uncomplicated: Secondary | ICD-10-CM | POA: Insufficient documentation

## 2020-12-30 DIAGNOSIS — Z7952 Long term (current) use of systemic steroids: Secondary | ICD-10-CM | POA: Diagnosis not present

## 2020-12-30 DIAGNOSIS — Z7982 Long term (current) use of aspirin: Secondary | ICD-10-CM | POA: Diagnosis not present

## 2020-12-30 DIAGNOSIS — I110 Hypertensive heart disease with heart failure: Secondary | ICD-10-CM | POA: Diagnosis present

## 2020-12-30 DIAGNOSIS — Z955 Presence of coronary angioplasty implant and graft: Secondary | ICD-10-CM | POA: Diagnosis not present

## 2020-12-30 DIAGNOSIS — I2510 Atherosclerotic heart disease of native coronary artery without angina pectoris: Secondary | ICD-10-CM | POA: Diagnosis not present

## 2020-12-30 DIAGNOSIS — E039 Hypothyroidism, unspecified: Secondary | ICD-10-CM | POA: Diagnosis not present

## 2020-12-30 DIAGNOSIS — B20 Human immunodeficiency virus [HIV] disease: Secondary | ICD-10-CM | POA: Diagnosis not present

## 2020-12-30 DIAGNOSIS — Z888 Allergy status to other drugs, medicaments and biological substances status: Secondary | ICD-10-CM | POA: Diagnosis not present

## 2020-12-30 DIAGNOSIS — I10 Essential (primary) hypertension: Secondary | ICD-10-CM | POA: Diagnosis not present

## 2020-12-30 DIAGNOSIS — I5022 Chronic systolic (congestive) heart failure: Secondary | ICD-10-CM | POA: Insufficient documentation

## 2020-12-30 DIAGNOSIS — Z79890 Hormone replacement therapy (postmenopausal): Secondary | ICD-10-CM | POA: Insufficient documentation

## 2020-12-30 DIAGNOSIS — Z79899 Other long term (current) drug therapy: Secondary | ICD-10-CM | POA: Diagnosis not present

## 2020-12-30 DIAGNOSIS — F1910 Other psychoactive substance abuse, uncomplicated: Secondary | ICD-10-CM

## 2020-12-30 MED ORDER — METOPROLOL SUCCINATE ER 25 MG PO TB24: 12.5000 mg | ORAL_TABLET | Freq: Every day | ORAL | 5 refills | Status: DC

## 2020-12-30 NOTE — Progress Notes (Signed)
Patient ID: Douglas Conway, male    DOB: 12/03/1955, 65 y.o.   MRN: [id number]   Mr Douglas Conway is a 65 y/o male with a history of CAD, hyperlipidemia, HTN, thyroid disease, HIV, wide complex tachycardia, current tobacco use and chronic heart failure.   Echo report from 10/08/20 reviewed and showed an EF of < 20%  LHC completed 10/08/20 and showed: Prox RCA lesion is 20% stenosed. Prox LAD to Mid LAD lesion is 100% stenosed.  1.  Severe one-vessel coronary artery disease with chronically occluded proximal LAD stent with faint right to left collaterals.  No obstructive disease affecting the RCA and left circumflex. 2.  Left ventricular angiography was not performed.  EF was severely reduced by echo at 15%. 3.  Right heart catheterization showed mild to moderate elevation in filling pressures, moderate to severe pulmonary hypertension and severely reduced cardiac output.  Admitted 10/13/20 due to wide complex tachycardia. Initially started on amiodarone drip and then transitioned to oral amiodarone. Cardiology and palliative care consults obtained. Urine drug toxicology with methamphetamine and cocaine. Lifevest placed. BP too low and toprol and entresto stopped. Low dose losartan started. Discharged after 4 days.   He presents today for a follow-up visit with a chief complaint of right leg pain due to sciatica. He says that this has been present for several months but that it's improved from previous visit. Says that it's better when he is up moving around. Has associated depression along with this. He denies any difficulty sleeping, abdominal distention, palpitations, pedal edema, chest pain, shortness of breath, cough, dizziness, fatigue or weight gain.   Not adding salt and rarely eats out.   Was previously started on metoprolol and entresto at the same time, developed hypotension and then both medications were stopped.   Past Medical History:  Diagnosis Date   Change in hearing, bilateral 06/28/2018    Chronic systolic congestive heart failure ([place])    a. 06/2019 Echo: EF 30-35%, glob HK. gr1 DD. Mod elev RVSP. Midly dil LA, Triv MR/AI. Asc Ao 59m; b. 09/2020 Echo: EF<20%, glob HK. Sev red RV fxn. RVSP 48.419mg. Mod dil LA. Sev dil RA. Mild MR.   Coronary artery disease    a. 03/2005 Ant STEMI s/p PCI/DES to the LAD; b. 05/2010 St echo: reportedly nl; c. 09/2020 Cath: LM nl, LAD 100p/m, D1 min irregs, LCX min irregs, RCA 20p. Dist LAD fills via collats from RPDA-->Med Rx.   [place]rug [place] ([facility]   a. cocaine/methamphetamines.   HIV disease ([facility]2020   Hyperlipidemia LDL goal <70    Hypertension 06/28/2018   Hypothyroidism 01/07/2019   Ischemic cardiomyopathy    a.  06/2019 Echo: EF 30-35%; 09/2020 Echo: EF<20%, glob HK.   Lateral epicondylitis 01/07/2019   Noncompliance    STEMI (ST elevation myocardial infarction) [place]   Tobacco abuse    [place]ide[place] [place]   a. 10/2020 - SVT w/ aberrancy vs VT-->s/p DCCV.   Past Surgical History:  Procedure Laterality Date   CORONARY ANGIOPLASTY WITH STENT PLACEMENT     LEFT HEART CATH AND CORONARY ANGIOGRAPHY N/A 10/08/2020   Procedure: LEFT HEART CATH AND CORONARY ANGIOGRAPHY;  Surgeon: ArWellington HampshireMD;  [facility];  Service: Cardiovascular;  Laterality: N/A;   History reviewed. No pertinent family history. Social History   Tobacco Use   Smoking status: Every Day    Packs/day: 1.00    Years: 42.00    Pack years: 42.00  Types: Cigarettes    Start date: 01/07/1968   Smokeless tobacco: Never   Tobacco comments:    Smoked 1 ppd for most of his adult life but now smoking 2 cigarettes daily  Substance Use Topics   Alcohol use: Yes    Alcohol/week: 1.0 standard drink    Types: 1 Cans of beer per week    Comment: 1 can/beer/daily   Allergies  Allergen Reactions   Plavix [Clopidogrel Bisulfate] Rash   Prior to Admission medications   Medication Sig Start Date End Date Taking? Authorizing Provider   amiodarone (PACERONE) 200 MG tablet Take 1 tablet (200 mg total) by mouth daily. 11/13/20  Yes Darylene Price A, FNP  aspirin 81 MG EC tablet Take 1 tablet (81 mg total) by mouth daily. Swallow whole. 11/16/20  Yes Darylene Price A, FNP  atorvastatin (LIPITOR) 20 MG tablet Take 1 tablet (20 mg total) by mouth daily. 11/16/20  Yes Marsh Heckler, Aura Fey, FNP  cabotegravir & rilpivirine ER (CABENUVA) 600 & 900 MG/3ML injection Inject 1 kit into the muscle every 2 (two) months. 11/09/20  Yes Kuppelweiser, Cassie L, RPH-CPP  Cholecalciferol (VITAMIN D-3) 125 MCG (5000 UT) TABS Take 5,000 Units by mouth daily.   Yes [provider]  losartan (COZAAR) 25 MG tablet Take 1 tablet (25 mg total) by mouth daily. 10/30/20  Yes Turner, Eber Hong, MD  methocarbamol (ROBAXIN) 500 MG tablet Take 1 tablet (500 mg total) by mouth every 6 (six) hours as needed for muscle spasms. 11/26/20  Yes Persons, Bevely Palmer, PA  polyethylene glycol (MIRALAX / GLYCOLAX) 17 g packet Take 17 g by mouth daily as needed for moderate constipation. 10/16/20  Yes Wieting, Richard, MD  predniSONE (DELTASONE) 10 MG tablet Take 1 tablet (10 mg total) by mouth daily with breakfast. 11/26/20  Yes Persons, Bevely Palmer, PA  sildenafil (VIAGRA) 50 MG tablet Take 1 tablet (50 mg total) by mouth daily as needed for erectile dysfunction. 11/16/20  Yes Golden Circle, FNP  spironolactone (ALDACTONE) 25 MG tablet Take 0.5 tablets (12.5 mg total) by mouth daily. 12/11/20 03/11/21 Yes Theora Gianotti, NP  levothyroxine (SYNTHROID) 175 MCG tablet Take 1 tablet (175 mcg total) by mouth daily before breakfast. Patient not taking: Reported on 12/30/2020 10/17/20   Carlyle Basques, MD   Review of Systems  Constitutional:  Negative for appetite change and fatigue.  HENT:  Negative for congestion, postnasal drip and sore throat.   Eyes: Negative.   Respiratory:  Negative for cough, chest tightness and shortness of breath.   Cardiovascular:  Negative for chest pain,  palpitations and leg swelling.  Gastrointestinal:  Negative for abdominal distention and abdominal pain.  Endocrine: Negative.   Genitourinary: Negative.   Musculoskeletal:  Positive for arthralgias (right leg d/t sciatica). Negative for back pain.  Skin: Negative.   Allergic/Immunologic: Negative.   Neurological:  Negative for dizziness and light-headedness.  Hematological:  Negative for adenopathy. Does not bruise/bleed easily.  Psychiatric/Behavioral:  Positive for dysphoric mood (loss of interest). Negative for sleep disturbance (sleeping on 1 pillow). The patient is not nervous/anxious.    Vitals:   12/30/20 1103  BP: 123/81  Pulse: 66  Resp: 18  SpO2: 98%  Weight: 188 lb 4 oz (85.4 kg)  Height: _0  (1.854 m)   Wt Readings from Last 3 Encounters:  12/30/20 188 lb 4 oz (85.4 kg)  12/10/20 189 lb 4 oz (85.8 kg)  11/16/20 193 lb (87.5 kg)   Lab Results  Component  Value Date   CREATININE 0.80 12/10/2020   CREATININE 1.06 11/16/2020   CREATININE 0.91 11/13/2020    Physical Exam Vitals and nursing note reviewed.  Constitutional:      Appearance: Normal appearance.  HENT:     Head: Normocephalic and atraumatic.  Cardiovascular:     Rate and Rhythm: Normal rate and regular rhythm.  Pulmonary:     Effort: Pulmonary effort is normal. No respiratory distress.     Breath sounds: No wheezing or rales.  Abdominal:     General: There is no distension.     Palpations: Abdomen is soft.  Musculoskeletal:        General: No tenderness.     Cervical back: Normal range of motion and neck supple.     Right lower leg: No edema.     Left lower leg: No edema.  Skin:    General: Skin is warm and dry.  Neurological:     General: No focal deficit present.     Mental Status: He is alert and oriented to person, place, and time.  Psychiatric:        Mood and Affect: Mood normal.        Behavior: Behavior normal.        Thought Content: Thought content normal.    Assessment &  Plan:  1: Chronic heart failure with reduced ejection fraction- - NYHA class I - euvolemic today - weighing daily; instructed to weigh daily and call for an overnight weight gain of > 2 pounds or a weekly weight gain of > 5 pounds - weight stable from last visit here 6 weeks ago - not adding salt and has been trying to follow a low sodium diet; written dietary information was given to him about this - saw cardiology Sharolyn Douglas) 12/10/20 - currently on GDMT of losartan & spironolactone - will resume metoprolol succinate 12.19m daily; he says that he has quite a bit left at home and had already cut all the tablets in 1/2; emphasized that he continue to abstain from drug use - handicap parking application filled out today - BNP 10/13/20 was 776.4  2: HTN- - BP looks good today - has NP appointment (Garnette Gunner 01/07/21  - BMP 12/10/20 reviewed and showed sodium 139, potassium 4.3, creatinine 0.8 and GFR 100  3:Substance use- - smoking 2 cigarettes daily but trying to stop completely; has been smoking since the age of 89- drinks 1 can of beer daily, sometimes the 12 ounce can and sometimes the 48 ounce can - denies any drug use since the end of August; continued beta blocker use is dependent on his ability to remain off drugs - discussed cessation of all substances was discussed for 3 minutes with him   Patient did not bring his medications nor a list. Each medication was verbally reviewed with the patient and he was encouraged to bring the bottles to every visit to confirm accuracy of list.   Return in 6 weeks or sooner for any questions/problems before then.

## 2020-12-30 NOTE — Patient Instructions (Addendum)
Continue weighing daily and call for an overnight weight gain of > 2 pounds or a weekly weight gain of >5 pounds.     Begin metoprolol as 1/2 tablet every morning.

## 2020-12-30 NOTE — Telephone Encounter (Signed)
This is a Educational psychologist pt, pt sees Dr. Mariah Milling now. Please address

## 2020-12-31 ENCOUNTER — Other Ambulatory Visit: Payer: Self-pay | Admitting: Internal Medicine

## 2021-01-01 NOTE — Telephone Encounter (Signed)
Medication refilled. Will need lab work for further refills if we are continuing to fill.   Marcos Eke, NP 01/01/2021 12:07 PM

## 2021-01-01 NOTE — Telephone Encounter (Signed)
Ok to refill 

## 2021-01-05 ENCOUNTER — Observation Stay
Admission: EM | Admit: 2021-01-05 | Discharge: 2021-01-05 | Disposition: A | Discharge status: Home / Self Care | Payer: Medicaid Other | Attending: Internal Medicine | Admitting: Internal Medicine

## 2021-01-05 DIAGNOSIS — F17210 Nicotine dependence, cigarettes, uncomplicated: Secondary | ICD-10-CM | POA: Diagnosis not present

## 2021-01-05 DIAGNOSIS — I429 Cardiomyopathy, unspecified: Secondary | ICD-10-CM | POA: Diagnosis not present

## 2021-01-05 DIAGNOSIS — Z720 Tobacco use: Secondary | ICD-10-CM

## 2021-01-05 DIAGNOSIS — B20 Human immunodeficiency virus [HIV] disease: Secondary | ICD-10-CM | POA: Diagnosis not present

## 2021-01-05 DIAGNOSIS — Z79899 Other long term (current) drug therapy: Secondary | ICD-10-CM | POA: Insufficient documentation

## 2021-01-05 DIAGNOSIS — I2510 Atherosclerotic heart disease of native coronary artery without angina pectoris: Secondary | ICD-10-CM | POA: Insufficient documentation

## 2021-01-05 DIAGNOSIS — Z20822 Contact with and (suspected) exposure to covid-19: Secondary | ICD-10-CM | POA: Insufficient documentation

## 2021-01-05 DIAGNOSIS — I472 Ventricular tachycardia, unspecified: Secondary | ICD-10-CM

## 2021-01-05 DIAGNOSIS — E785 Hyperlipidemia, unspecified: Secondary | ICD-10-CM | POA: Diagnosis present

## 2021-01-05 DIAGNOSIS — Z7982 Long term (current) use of aspirin: Secondary | ICD-10-CM | POA: Diagnosis not present

## 2021-01-05 DIAGNOSIS — Y900 Blood alcohol level of less than 20 mg/100 ml: Secondary | ICD-10-CM | POA: Diagnosis not present

## 2021-01-05 DIAGNOSIS — I5022 Chronic systolic (congestive) heart failure: Secondary | ICD-10-CM

## 2021-01-05 DIAGNOSIS — R000 Tachycardia, unspecified: Secondary | ICD-10-CM

## 2021-01-05 DIAGNOSIS — R778 Other specified abnormalities of plasma proteins: Secondary | ICD-10-CM | POA: Diagnosis not present

## 2021-01-05 DIAGNOSIS — I110 Hypertensive heart disease with heart failure: Secondary | ICD-10-CM | POA: Diagnosis not present

## 2021-01-05 DIAGNOSIS — E039 Hypothyroidism, unspecified: Secondary | ICD-10-CM | POA: Diagnosis not present

## 2021-01-05 DIAGNOSIS — Z955 Presence of coronary angioplasty implant and graft: Secondary | ICD-10-CM | POA: Insufficient documentation

## 2021-01-05 DIAGNOSIS — I5033 Acute on chronic diastolic (congestive) heart failure: Secondary | ICD-10-CM | POA: Diagnosis not present

## 2021-01-05 DIAGNOSIS — F1910 Other psychoactive substance abuse, uncomplicated: Secondary | ICD-10-CM | POA: Diagnosis present

## 2021-01-05 DIAGNOSIS — I10 Essential (primary) hypertension: Secondary | ICD-10-CM | POA: Diagnosis present

## 2021-01-05 LAB — URINE DRUG SCREEN, QUALITATIVE (ARMC ONLY)
Amphetamines, Ur Screen: POSITIVE — AB
Barbiturates, Ur Screen: NOT DETECTED
Benzodiazepine, Ur Scrn: POSITIVE — AB
Cannabinoid 50 Ng, Ur ~~LOC~~: NOT DETECTED
Cocaine Metabolite,Ur ~~LOC~~: POSITIVE — AB
MDMA (Ecstasy)Ur Screen: NOT DETECTED
Methadone Scn, Ur: NOT DETECTED
Opiate, Ur Screen: NOT DETECTED
Phencyclidine (PCP) Ur S: NOT DETECTED
Tricyclic, Ur Screen: NOT DETECTED

## 2021-01-05 LAB — COMPREHENSIVE METABOLIC PANEL
ALT: 32 U/L (ref 0–44)
AST: 45 U/L — ABNORMAL HIGH (ref 15–41)
Albumin: 4.1 g/dL (ref 3.5–5.0)
Alkaline Phosphatase: 54 U/L (ref 38–126)
Anion gap: 8 (ref 5–15)
BUN: 28 mg/dL — ABNORMAL HIGH (ref 8–23)
CO2: 25 mmol/L (ref 22–32)
Calcium: 9 mg/dL (ref 8.9–10.3)
Chloride: 106 mmol/L (ref 98–111)
Creatinine, Ser: 1.2 mg/dL (ref 0.61–1.24)
GFR, Estimated: >60 mL/min (ref >60)
Glucose, Bld: 110 mg/dL — ABNORMAL HIGH (ref 70–99)
Potassium: 3.7 mmol/L (ref 3.5–5.1)
Sodium: 139 mmol/L (ref 135–145)
Total Bilirubin: 0.8 mg/dL (ref 0.3–1.2)
Total Protein: 7.3 g/dL (ref 6.5–8.1)

## 2021-01-05 LAB — CBC WITH DIFFERENTIAL/PLATELET
Abs Immature Granulocytes: 0.02 10*3/uL (ref 0.00–0.07)
Basophils Absolute: 0.1 10*3/uL (ref 0.0–0.1)
Basophils Relative: 1 %
Eosinophils Absolute: 0.1 10*3/uL (ref 0.0–0.5)
Eosinophils Relative: 1 %
HCT: 40.5 % (ref 39.0–52.0)
Hemoglobin: 14.2 g/dL (ref 13.0–17.0)
Immature Granulocytes: 0 %
Lymphocytes Relative: 34 %
Lymphs Abs: 2.2 10*3/uL (ref 0.7–4.0)
MCH: 32.3 pg (ref 26.0–34.0)
MCHC: 35.1 g/dL (ref 30.0–36.0)
MCV: 92 fL (ref 80.0–100.0)
Monocytes Absolute: 0.5 10*3/uL (ref 0.1–1.0)
Monocytes Relative: 7 %
Neutro Abs: 3.6 10*3/uL (ref 1.7–7.7)
Neutrophils Relative %: 57 %
Platelets: 238 10*3/uL (ref 150–400)
RBC: 4.4 MIL/uL (ref 4.22–5.81)
RDW: 13.7 % (ref 11.5–15.5)
WBC: 6.4 10*3/uL (ref 4.0–10.5)
nRBC: 0 % (ref 0.0–0.2)

## 2021-01-05 LAB — ETHANOL: Alcohol, Ethyl (B): <10 mg/dL (ref <10)

## 2021-01-05 LAB — RESP PANEL BY RT-PCR (FLU A&B, COVID) ARPGX2
Influenza A by PCR: NEGATIVE
Influenza B by PCR: NEGATIVE
SARS Coronavirus 2 by RT PCR: NEGATIVE

## 2021-01-05 LAB — TROPONIN I (HIGH SENSITIVITY)
Troponin I (High Sensitivity): 18 ng/L — ABNORMAL HIGH (ref <18)
Troponin I (High Sensitivity): 22 ng/L — ABNORMAL HIGH (ref <18)
Troponin I (High Sensitivity): 29 ng/L — ABNORMAL HIGH (ref <18)

## 2021-01-05 LAB — BRAIN NATRIURETIC PEPTIDE: B Natriuretic Peptide: 184.1 pg/mL — ABNORMAL HIGH (ref 0.0–100.0)

## 2021-01-05 LAB — PROTIME-INR
INR: 1 (ref 0.8–1.2)
Prothrombin Time: 13.3 seconds (ref 11.4–15.2)

## 2021-01-05 LAB — MAGNESIUM: Magnesium: 1.9 mg/dL (ref 1.7–2.4)

## 2021-01-05 LAB — TSH: TSH: 66 u[IU]/mL — ABNORMAL HIGH (ref 0.350–4.500)

## 2021-01-05 MED ORDER — ASPIRIN EC 81 MG PO TBEC: 81.0000 mg | DELAYED_RELEASE_TABLET | Freq: Every day | ORAL | Status: DC

## 2021-01-05 MED ORDER — VITAMIN D 25 MCG (1000 UNIT) PO TABS: 5000.0000 [IU] | ORAL_TABLET | Freq: Every day | ORAL | Status: DC

## 2021-01-05 MED ORDER — LOSARTAN POTASSIUM 50 MG PO TABS: 25.0000 mg | ORAL_TABLET | Freq: Every day | ORAL | Status: DC

## 2021-01-05 MED ORDER — ACETAMINOPHEN 325 MG PO TABS
650.0000 mg | ORAL_TABLET | Freq: Four times a day (QID) | ORAL | Status: DC | PRN
Start: 2021-01-05 — End: 2021-01-05

## 2021-01-05 MED ORDER — POLYETHYLENE GLYCOL 3350 17 G PO PACK: 17.0000 g | PACK | Freq: Every day | ORAL | Status: DC | PRN

## 2021-01-05 MED ORDER — AMIODARONE LOAD VIA INFUSION
150.0000 mg | Freq: Once | INTRAVENOUS | Status: AC
  Administered 2021-01-05: 150 mg via INTRAVENOUS
  Filled 2021-01-05: qty 83.34

## 2021-01-05 MED ORDER — ATORVASTATIN CALCIUM 20 MG PO TABS: 20.0000 mg | ORAL_TABLET | Freq: Every day | ORAL | Status: DC

## 2021-01-05 MED ORDER — AMIODARONE HCL IN DEXTROSE 360-4.14 MG/200ML-% IV SOLN
30.0000 mg/h | INTRAVENOUS | Status: DC
  Filled 2021-01-05: qty 200

## 2021-01-05 MED ORDER — HYDRALAZINE HCL 20 MG/ML IJ SOLN: 5.0000 mg | INTRAMUSCULAR | Status: DC | PRN

## 2021-01-05 MED ORDER — NICOTINE 21 MG/24HR TD PT24
21.0000 mg | MEDICATED_PATCH | Freq: Every day | TRANSDERMAL | Status: DC
  Administered 2021-01-05: 21 mg via TRANSDERMAL
  Filled 2021-01-05: qty 1

## 2021-01-05 MED ORDER — AMIODARONE HCL 200 MG PO TABS: 200.0000 mg | ORAL_TABLET | Freq: Every day | ORAL | Status: DC

## 2021-01-05 MED ORDER — CABOTEGRAVIR & RILPIVIRINE ER 600 & 900 MG/3ML IM SUER: 1.0000 | INTRAMUSCULAR | Status: DC

## 2021-01-05 MED ORDER — METOPROLOL SUCCINATE ER 25 MG PO TB24
12.5000 mg | ORAL_TABLET | Freq: Every day | ORAL | Status: DC
  Administered 2021-01-05: 12.5 mg via ORAL
  Filled 2021-01-05: qty 0.5

## 2021-01-05 MED ORDER — METHOCARBAMOL 500 MG PO TABS
500.0000 mg | ORAL_TABLET | Freq: Four times a day (QID) | ORAL | Status: DC | PRN
  Filled 2021-01-05: qty 1

## 2021-01-05 MED ORDER — AMIODARONE HCL IN DEXTROSE 360-4.14 MG/200ML-% IV SOLN
60.0000 mg/h | INTRAVENOUS | Status: AC
  Administered 2021-01-05 (×2): 60 mg/h via INTRAVENOUS
  Filled 2021-01-05: qty 200

## 2021-01-05 MED ORDER — POTASSIUM CHLORIDE CRYS ER 20 MEQ PO TBCR
40.0000 meq | EXTENDED_RELEASE_TABLET | Freq: Once | ORAL | Status: AC
  Administered 2021-01-05: 40 meq via ORAL
  Filled 2021-01-05: qty 2

## 2021-01-05 MED ORDER — MAGNESIUM SULFATE IN D5W 1-5 GM/100ML-% IV SOLN
1.0000 g | Freq: Once | INTRAVENOUS | Status: AC
  Administered 2021-01-05: 1 g via INTRAVENOUS
  Filled 2021-01-05: qty 100

## 2021-01-05 MED ORDER — SPIRONOLACTONE 25 MG PO TABS
12.5000 mg | ORAL_TABLET | Freq: Every day | ORAL | Status: DC
  Filled 2021-01-05: qty 0.5

## 2021-01-05 NOTE — ED Triage Notes (Signed)
Pt BIBA from home s/p VTACH. Pt with lifevest calls EMS after being alerted from his vest of abnormal rhythm. Pt continuously delayed shock until EMS arrival - requests that EMS shocks him. EMS administers 2 versed 100J with positive conversion.    Pt arrives with snoring resp, arousable to tactile stimuli.

## 2021-01-05 NOTE — ED Notes (Deleted)
IVC/pending placement/ IVC paperwork to be renewed 9/28 

## 2021-01-05 NOTE — Consult Note (Signed)
Cardiology Consultation:   Patient ID: Tyrail Grandfield; [id number]; [birth year]   Admit date: 01/05/2021 Date of Consult: 01/05/2021  Primary Care Provider: Pcp, No Primary Cardiologist: Radford Pax Primary Electrophysiologist:  None   Patient Profile:   Rihaan Barrack is a 65 y.o. 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 Blaine Hamper.  History of Present Illness:   Mr. Hardrick cardiac history dates back to 2006, when he suffered an anterior STEMI and underwent PCI/DES to the LAD.  He was found to have LV dysfunction at that time with an EF of 25%, and was initiated medical therapy.  EF remained depressed and he was advised that he needed an ICD, but declined.  He was lost to follow-up but reestablished care with our [place] office in 2021.  Echo in 06/2019 showed an EF of 30 to 35% with global hypokinesis.  In cardiology follow up, Toprol-XL and Entresto were added.  He did not fill those prescriptions.  He was hospitalized in late 09/2020 following sudden onset of dyspnea, diaphoresis, and lightheadedness.  He believed he had a syncopal episode.  He called EMS and was found to be in a wide-complex tachycardia at 180 to 190 bpm.  He underwent direct-current cardioversion at 100 J with restoration of sinus rhythm.  He was then placed on amiodarone and transported to [facility] ED.  He subsequently ruled in for non-STEMI.  He was found to have a markedly elevated TSH of 65.394.  Echo showed an EF of less than 20% with global hypokinesis.  He underwent  diagnostic cardiac cath that revealed a total occlusion of the proximal/mid LAD at the site of prior stent with right to left collaterals.  He otherwise had minor irregularities/nonobstructive disease.  He left AMA following cath, but presented back to the emergency department on 10/13/2020 with chest pain, diaphoresis, and near syncope.  He had used both methamphetamines and cocaine.  He was hypertensive and tachycardic on arrival with a wide-complex tachycardia of 197 bpm.  He underwent cardioversion at 120 J x 1 and was placed on IV amiodarone, and admitted for further evaluation.  Following admission, he was relatively hypotensive, limiting guideline directed medical therapy.  He was placed on low-dose losartan and had no further wide-complex tachycardia.  He was agreeable to having a LifeVest placed, as he was not felt to be an ICD candidate in the setting of recent drug use.   He was seen by Dr. Radford Pax in [place] on 10/30/2020 at which time he was feeling well.  Recommendation was made for EP and advanced heart failure service referral however, patient requested follow-up in [place].  We last saw him in our [place] office on 12/10/2020, at which time he denied cocaine use, though did report using crystal meth earlier that week.  He had not been wearing his LifeVest.  He reported one episode where he became tachycardic and felt presyncopal.  He asked a friend to go to his car and get the [place] but when the friend returned with it, symptoms had abated.  He felt the amiodarone was doing a good enough job and that he did not need a LifeVest.   He was started metoprolol and Entresto on 9/21, though indicates he was later contacted "by someone" to stop these medications. He states he took them for about 1 week.  Notes indicate he has had to call EMS out for shock and refused transport to the ED.  LifeVest has no record of the vest supplying a shock. He has remained noncompliant with LifeVest. He last  used crystal meth approximately 1 week ago per his report. He last used cocaine last evening. Around 11 PM, he was working on a car with some friends when he developed chest pain, following cocaine use. Given pain, he went inside and placed his LifeVest on. Upon doing this, the vest was alarming him, though he kept aborting the shock LifeVest strips were reviewed and shows the patient was wearing his vest at ~ 3 AM and was in a WCT running about 170 bpm, consistent with sustained VT for 44 seconds. He ended up calling EMS due to persistent symptoms and was noted to be in [facility] upon their arrival. He was given 2 mg Versed and underwent shock x 1 at 100 J and was transported to the ED. Upon arrival to the ED, his vitals were stable and he was without angina, palpitations, or dyspnea. Labs showed a high sensitivity troponin of 18 with a delta of 22. BNP 184. Urine drug screen positive for amphetamines, cocaine, and benzodiazapine.  TSH 66. Ethanol < 10. Covid and influenza negative. Potassium 3.7, BUN 28, SCr 1.2, AST 45, magnesium 1.9, and HGB 14.2. EKG showed NSR, 79 bpm, 1st degree AV block, baseline wandering, and nonspecific st/t changes. In the ED, he was bolused with amiodarone and started on an amio gtt. He has also receveid KCl and IV magnesium. He is currently without complaints and states he is "done" with cocaine and meth.     Past Medical History:  Diagnosis Date   Change in hearing, bilateral 40/11/6759   Chronic systolic congestive heart failure ([place])    a. 06/2019 Echo: EF 30-35%, glob HK. gr1 DD. Mod elev RVSP. Midly dil LA, Triv MR/AI. Asc Ao 40m; b. 09/2020 Echo: EF<20%, glob HK. Sev red RV fxn. RVSP 48.48mg. Mod dil LA. Sev dil RA. Mild MR.   Coronary artery disease    a. 03/2005 Ant STEMI s/p PCI/DES to the LAD; b. 05/2010 St echo: reportedly nl; c. 09/2020 Cath: LM nl, LAD 100p/m, D1 min irregs, LCX min irregs, RCA 20p. Dist LAD fills via collats from RPDA-->Med Rx.   Drug abuse ([facility]    a. cocaine/methamphetamines.   HIV disease ([facility]03/19/2020   Hyperlipidemia LDL goal <70    Hypertension 06/28/2018   Hypothyroidism 01/07/2019   Ischemic cardiomyopathy    a.  06/2019 Echo: EF 30-35%; 09/2020 Echo: EF<20%, glob HK.   Lateral epicondylitis 01/07/2019   Noncompliance    STEMI (ST elevation myocardial infarction) ([facility]   Tobacco abuse    Wide-complex tachycardia ([facility]   a. 10/2020 - SVT w/ aberrancy vs VT-->s/p DCCV.    Past Surgical History:  Procedure Laterality Date   CORONARY ANGIOPLASTY WITH STENT PLACEMENT     LEFT HEART CATH AND CORONARY ANGIOGRAPHY N/A 10/08/2020   Procedure: LEFT HEART CATH AND CORONARY ANGIOGRAPHY;  Surgeon: ArWellington HampshireMD;  [facility];  Service: Cardiovascular;  Laterality: N/A;     Home Meds: Prior  to Admission medications   Medication Sig Start Date End Date Taking? Authorizing Provider  amiodarone (PACERONE) 200 MG tablet Take 1 tablet (200 mg total) by mouth daily. 11/13/20  Yes Darylene Price A, FNP  aspirin 81 MG EC tablet Take 1 tablet (81 mg total) by mouth daily. Swallow whole. 11/16/20  Yes Darylene Price A, FNP  atorvastatin (LIPITOR) 20 MG tablet Take 1 tablet (20 mg total) by mouth daily. 11/16/20  Yes Hackney, Otila Kluver A, FNP  Cholecalciferol (VITAMIN D-3) 125 MCG (5000 UT) TABS Take 5,000 Units by mouth daily.   Yes [provider]  cyclobenzaprine (FLEXERIL) 10 MG tablet Take 10 mg by mouth 3 (three) times daily as needed. 11/06/20  Yes [provider]  levothyroxine (SYNTHROID) 175 MCG tablet TAKE 1 TABLET BY MOUTH ONCE DAILY BEFORE BREAKFAST 01/01/21  Yes Golden Circle, FNP  losartan (COZAAR) 25 MG tablet Take 1 tablet (25 mg total) by mouth daily. 10/30/20  Yes Turner, Eber Hong, MD  methocarbamol (ROBAXIN) 500 MG tablet Take 1 tablet (500 mg total) by mouth every 6 (six) hours as needed for muscle spasms. 11/26/20  Yes Persons, Bevely Palmer, PA  polyethylene glycol (MIRALAX / GLYCOLAX) 17 g  packet Take 17 g by mouth daily as needed for moderate constipation. 10/16/20  Yes Wieting, Richard, MD  predniSONE (DELTASONE) 10 MG tablet Take 1 tablet (10 mg total) by mouth daily with breakfast. 11/26/20  Yes Persons, Bevely Palmer, PA  sildenafil (VIAGRA) 50 MG tablet Take 1 tablet (50 mg total) by mouth daily as needed for erectile dysfunction. 11/16/20  Yes Golden Circle, FNP  spironolactone (ALDACTONE) 25 MG tablet Take 0.5 tablets (12.5 mg total) by mouth daily. 12/11/20 03/11/21 Yes Theora Gianotti, NP  cabotegravir & rilpivirine ER (CABENUVA) 600 & 900 MG/3ML injection Inject 1 kit into the muscle every 2 (two) months. 11/09/20   Kuppelweiser, Cassie L, RPH-CPP  metoprolol succinate (TOPROL XL) 25 MG 24 hr tablet Take 0.5 tablets (12.5 mg total) by mouth daily. Patient not taking: No sig reported 12/30/20   Alisa Graff, FNP    Inpatient Medications: Scheduled Meds:  metoprolol succinate  12.5 mg Oral Daily   nicotine  21 mg Transdermal Daily   Continuous Infusions:  amiodarone 30 mg/hr (01/05/21 1048)   PRN Meds: acetaminophen, hydrALAZINE  Allergies:   Allergies  Allergen Reactions   Plavix [Clopidogrel Bisulfate] Rash    Social History:   Social History   Socioeconomic History   Marital status: Divorced    Spouse name: Not on file   Number of children: Not on file   Years of education: Not on file   Highest education level: Not on file  Occupational History   Not on file  Tobacco Use   Smoking status: Every Day    Packs/day: 1.00    Years: 42.00    Pack years: 42.00    Types: Cigarettes    Start date: 01/07/1968   Smokeless tobacco: Never   Tobacco comments:    Smoked 1 ppd for most of his adult life but now smoking 2 cigarettes daily  Vaping Use   Vaping Use: Never used  Substance and Sexual Activity   Alcohol use: Yes    Alcohol/week: 1.0 standard drink    Types: 1 Cans of beer per week    Comment: 1 can/beer/daily   Drug use: Not Currently     Types: Methamphetamines    Comment: smokes or snorts crystal meth 2-3x/wk.  Sexual activity: Not Currently    Partners: Female    Birth control/protection: Condom    Comment: given condoms  Other Topics Concern   Not on file  Social History Narrative   Lives in [place] by himself.  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   Social Determinants of Health   Financial Resource Strain: Not on file  Food Insecurity: Not on file  Transportation Needs: Not on file  Physical Activity: Not on file  Stress: Not on file  Social Connections: Not on file  Intimate Partner Violence: Not on file     Family History:   Family History  Problem Relation Age of Onset   Diabetes Mellitus II Father     ROS:  Review of Systems  Constitutional:  Positive for malaise/fatigue. Negative for chills, diaphoresis, fever and weight loss.  HENT:  Negative for congestion.   Eyes:  Negative for discharge and redness.  Respiratory:  Negative for cough, hemoptysis, shortness of breath and wheezing.   Cardiovascular:  Positive for chest pain. Negative for palpitations, orthopnea, claudication, leg swelling and PND.  Gastrointestinal:  Negative for abdominal pain, heartburn, nausea and vomiting.  Musculoskeletal:  Negative for falls and myalgias.  Skin:  Negative for rash.  Neurological:  Negative for dizziness, tingling, tremors, sensory change, speech change, focal weakness, loss of consciousness and weakness.  Endo/Heme/Allergies:  Does not bruise/bleed easily.  Psychiatric/Behavioral:  Positive for substance abuse. The patient is not nervous/anxious.   All other systems reviewed and are negative.    Physical Exam/Data:   Vitals:   01/05/21 0830 01/05/21 0905 01/05/21 1000 01/05/21 1030  BP: 116/70 110/68 126/77 (!) 133/94  Pulse: (!) 54 (!) 47 (!) 47 (!) 54  Resp: (!) 22 16    Temp:      TempSrc:      SpO2: 93% 100% 100% 99%    Intake/Output Summary (Last 24 hours) at 01/05/2021  1142 Last data filed at 01/05/2021 1009 Gross per 24 hour  Intake 100 ml  Output 700 ml  Net -600 ml   There were no vitals filed for this visit. There is no height or weight on file to calculate BMI.   Physical Exam: General: Well developed, well nourished, in no acute distress. LifeVest in place. Pads in place. On cardiac monitor.   Head: Normocephalic, atraumatic, sclera non-icteric, no xanthomas, nares without discharge.  Neck: Negative for carotid bruits. JVD not elevated. Lungs: Clear bilaterally to auscultation without wheezes, rales, or rhonchi. Breathing is unlabored. Heart: RRR with S1 S2. No murmurs, rubs, or gallops appreciated. Abdomen: Soft, non-tender, non-distended with normoactive bowel sounds. No hepatomegaly. No rebound/guarding. No obvious abdominal masses. Msk:  Strength and tone appear normal for age. Extremities: No clubbing or cyanosis. No edema. Distal pedal pulses are 2+ and equal bilaterally. Neuro: Alert and oriented X 3. No facial asymmetry. No focal deficit. Moves all extremities spontaneously. Psych:  Responds to questions appropriately with a normal affect.   EKG:  The EKG was personally reviewed and demonstrates: NSR, 79 bpm, 1st degree AV block, baseline wandering, and nonspecific st/t changes Telemetry:  Telemetry was personally reviewed and demonstrates: SR with PVCs and intermittent bundle branch block  Weights: There were no vitals filed for this visit.  Relevant CV Studies:  2D echo 10/08/2020: 1. Left ventricular ejection fraction, by estimation, is <20%. The left  ventricle has severely decreased function. The left ventricle demonstrates  global hypokinesis. The left ventricular internal cavity size was severely  dilated. Left ventricular  diastolic parameters are indeterminate.   2. Right ventricular systolic function is severely reduced. The right  ventricular size is moderately enlarged. There is moderately elevated  pulmonary artery  systolic pressure. The estimated right ventricular  systolic pressure is 08.8 mmHg.   3. Left atrial size was moderately dilated.   4. Right atrial size was severely dilated.   5. The mitral valve is normal in structure. Mild mitral valve  regurgitation.   6. The aortic valve was not well visualized. Aortic valve regurgitation  is not visualized.   7. The inferior vena cava is normal in size with <50% respiratory  variability, suggesting right atrial pressure of 8 mmHg. __________   LHC 10/08/2020: Prox RCA lesion is 20% stenosed. Prox LAD to Mid LAD lesion is 100% stenosed.   1.  Severe one-vessel coronary artery disease with chronically occluded proximal LAD stent with faint right to left collaterals.  No obstructive disease affecting the RCA and left circumflex. 2.  Left ventricular angiography was not performed.  EF was severely reduced by echo at 15%. 3.  Right heart catheterization showed mild to moderate elevation in filling pressures, moderate to severe pulmonary hypertension and severely reduced cardiac output.   Recommendations: Suspect that the LAD stent has been occluded for a long time.  No benefit from revascularization.  Continue medical therapy. The patient's heart failure needs optimization.  Recommend management by advanced heart failure. In addition, given presumed ventricular tachycardia, recommend urgent EP evaluation for ICD/CRT. __________   2D echo 06/12/2019: 1. Left ventricular ejection fraction, by estimation, is 30 to 35%. The  left ventricle has moderately decreased function. The left ventricle  demonstrates global hypokinesis. The left ventricular internal cavity size  was moderately dilated. There is mild   left ventricular hypertrophy. Left ventricular diastolic parameters are  consistent with Grade I diastolic dysfunction (impaired relaxation).   2. Right ventricular systolic function is normal. The right ventricular  size is normal. There is moderately  elevated pulmonary artery systolic  pressure.   3. Left atrial size was mildly dilated.   4. The mitral valve is normal in structure and function. Trivial mitral  valve regurgitation. No evidence of mitral stenosis.   5. The aortic valve is tricuspid. Aortic valve regurgitation is trivial.  No aortic stenosis is present.   6. Aortic dilatation noted. 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   7. The inferior vena cava is dilated in size with >50% respiratory  variability, suggesting right atrial pressure of 8 mmHg.   Laboratory Data:  Chemistry Recent Labs  Lab 01/05/21 0431  NA 139  K 3.7  CL 106  CO2 25  GLUCOSE 110*  BUN 28*  CREATININE 1.20  CALCIUM 9.0  GFRNONAA >60  ANIONGAP 8    Recent Labs  Lab 01/05/21 0431  PROT 7.3  ALBUMIN 4.1  AST 45*  ALT 32  ALKPHOS 54  BILITOT 0.8   Hematology Recent Labs  Lab 01/05/21 0431  WBC 6.4  RBC 4.40  HGB 14.2  HCT 40.5  MCV 92.0  MCH 32.3  MCHC 35.1  RDW 13.7  PLT 238   Cardiac EnzymesNo results for input(s): TROPONINI in the last 168 hours. No results for input(s): TROPIPOC in the last 168 hours.  BNP Recent Labs  Lab 01/05/21 0431  BNP 184.1*    DDimer No results for input(s): DDIMER in the last 168 hours.  Radiology/Studies:  No results found.  Assessment and Plan:   1. WCT: -[facility] has been contacted with strips showing 44 seconds of sustained WCT with a rate of about 170 bpm, concerning for VT around 3 AM last night, no reports of shock being delivered by his vest (these will be scanned into his chart) -Likely in the setting of ongoing crystal meth and cocaine use -Previously noncompliant with wearing LifeVest  -IV amiodarone  -Toprol XL -Recommend EP evaluation on 9/28, if possible  -Potassium 3.7, has received repletion in the ED, recommend maintaining a goal of 4.0 -Magnesium 1.9, recommend maintaining goal 2.0, IV repletion in the ED -Wearing LifeVest  2. CAD  involving the native coronary arteries: -Currently without angina -Episode of angina at home likely in the setting of sustained WCT -HS-Tn 18 with a delta of 22, continue to cycle until peak (there will be some degree of elevation with known CAD and DCCV) -No indication for heparin gtt at this time -ASA, Lipitor  -No plans for inpatient ischemic evaluation at this time given recent LHC in 09/2020  3. HFrEF secondary to ICM: -He appears euvolemic  -Toprol XL -He reports he has previously filled out paperwork for Entresto, we will have our office look into this, recommend starting Entresto as BP allows, potentially tomorrow -SGLT2i is likely to be cost prohibitive  -Previously recommended that he establish with EP and the advanced heart failure clinic, he declined -Will not be a candidate for ICD with ongoing polysubstance abuse  4. Polysubstance abuse: -Urine drug screen again positive for cocaine and methamphetamine -Complete cessation is advised -He will not be a candidate for ICD with ongoing polysubstance abuse  5. HTN: -Blood pressure stable  -Medications as above  6. HLD: -LDL 43 in 03/2020 -PTA Lipitor   7. Abnormal TSH: -He been recommended to take levothyroxine, though it appears he remains noncompliant with this  Overall prognosis is poor with high risk of death with continued polysubstance abuse and medical noncompliance    For questions or updates, please contact [place] Please consult [email or web address] for contact info under Cardiology/STEMI.   Signed, Christell Faith, PA-C [facility] Pager: [phone] 01/05/2021, 11:42 AM

## 2021-01-05 NOTE — ED Notes (Signed)
Pt demanding to sign out.  Pt took off all his monitoring devices.  Provider contacted and on his way to come speak to pt.  Pt says he will not stay.  Pt wanting to sign out AMA.  Pt currently off the heart monitor awaiting provider to speak with him.

## 2021-01-05 NOTE — ED Notes (Signed)
Pt signed out AMA.  This nurse explained to the patient by signing out AMA he was risking his life.  Pt verbalized understanding and states I will call EMS again to save me and I will follow up with my PCP as I am ordered too. Pt provider spoke to the patient in length regarding his medical condition.

## 2021-01-05 NOTE — ED Notes (Signed)
Messaged pharmacy for missing metoprolol.
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
## 2021-01-05 NOTE — ED Notes (Signed)
Pt sleeping, rate changed on Amiodarone per orders.

## 2021-01-05 NOTE — ED Notes (Signed)
Dr End cardiologist at bedside.

## 2021-01-05 NOTE — Discharge Summary (Signed)
Physician Discharge Summary  Douglas Conway ZOX:[id number] DOB: 28-Jul-1955 DOA: 01/05/2021  PCP: Pcp, No  Admit date: 01/05/2021 Discharge date: 01/05/2021  Recommendations for Outpatient Follow-up:  -none since pt left hospital on AMA  Home Health: none Equipment/Devices: none  Discharge Condition: stable CODE STATUS: full Diet recommendation: should be on heart healthy diet  Brief/Interim Summary (HPI)  Douglas Conway is a 65 y.o. 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     Pt states that he has multiple episode of irregular heartbeat in the past 2 days.  He never lost consciousness.  He was shocked by LifeVest at least once at home. He had another episode of irregular heartbeat, he was shocked by EMS, but refused to be transported to emergency room. Last night, he has had a more prolonged episode and he was unable to make himself use the LifeVest, so he called EMS for them to defibrillate him. He was administered Versed 2 mg IV and then defibrillated with 100 J, back into sinus rhythm. IV amiodarone gtt is started in ED. Pt arrives somnolent, but is arousable. He is orientated x3 when waken up.  He moves all extremities normally.  Patient denies chest pain, cough, shortness of breath.  Denies nausea, vomiting, diarrhea or abdominal pain.  No symptoms of UTI. He is still wearing the LifeVest. Of note, pt states that he took steroid for thrombocytopenia in the past.   ED Course: pt was found to have troponin level 18, 22, INR 1.0, negative COVID PCR, alcohol level less than 10, UDS positive for cocaine and benzo, GFR > 60, magnesium 1.9, potassium 3.7, temperature 97.1, blood pressure 151/108, heart rate 83 currently, RR 20, oxygen saturation 94% on room  air.  Patient is placed on progressive bed for observation. Dr. Okey Dupre of cardiology is consulted   Discharge Diagnoses and Hospital Course:   Principal Problem:   V tach (HCC) Active Problems:   HIV infection (HCC)   Hypertension   Hypothyroidism   Chronic systolic CHF (congestive heart failure) (HCC)   Wide-complex tachycardia (HCC)   Elevated troponin   Hyperlipidemia   Tobacco abuse   Coronary artery disease   Polysubstance abuse (HCC)  V tach (HCC) and history of wide-complex tachycardia: pt still has LifeVest. Currently pt is in sinus rhythm. HR 80-90s. No CP. Consulted Dr. Okey Dupre of card.  Treatment plan was made as following, unfortunately patient left hospital on AMA.  I spent a lengthy time to have talked with patient, tried harder to convince him to stay in the hospital.  He is aware of the risk of going home at current condition, including possible death.  Later on I was told by nurse that the patient left hospital AMA.   -Placed on progressive bed for observation -Continued IV amiodarone drip -Potassium 3.7, gave 40 mEq of potassium -Magnesium is 1.9,  gave 1 g of magnesium sulfate -Cardiac monitoring -Check TSH -Hold metoprolol due to cocaine abuse   HIV infection (HCC) -Continued home medications   Hypertension -IV hydralazine as needed -Continued Cozaar, spironolactone   Hypothyroidism -Synthroid -Checked TSH   Chronic systolic CHF (congestive heart failure) (HCC): 2D echo on 10/08/2020 showed EF <20%.  Patient does not have leg edema or JVD.  CHF seem to be compensated -Continued spironolactone   Elevated troponin and CAD:  Troponin level 18, 22, likely due to demand ischemia and cocaine abuse -Aspirin, Lipitor -Trend troponin -Checkd A1c, FLP   Hyperlipidemia -Lipitor   Tobacco abuse -Nicotine patch   Polysubstance abuse (HCC): UDS positive for cocaine. -Did counseling about importance of quitting substance use      Discharge  Instructions   Allergies as of 01/05/2021       Reactions   Plavix [clopidogrel Bisulfate] Rash            Allergies  Allergen Reactions   Plavix [Clopidogrel Bisulfate] Rash    Consultations: CARD   Procedures/Studies: No results found.    Discharge Exam: Vitals:   01/05/21 1204 01/05/21 1230  BP: 128/77 131/81  Pulse: (!) 58 (!) 55  Resp:    Temp:    SpO2:  100%   Vitals:   01/05/21 1130 01/05/21 1200 01/05/21 1204 01/05/21 1230  BP: (!) 125/91 128/77 128/77 131/81  Pulse: (!) 45 (!) 52 (!) 58 (!) 55  Resp: 18 16    Temp:      TempSrc:      SpO2: 100% 100%  100%    The results of significant diagnostics from this hospitalization (including imaging, microbiology, ancillary and laboratory) are listed below for reference.     Microbiology: Recent Results (from the past 240 hour(s))  Resp Panel by RT-PCR (Flu A&B, Covid) Nasopharyngeal Swab     Status: None   Collection Time: 01/05/21  4:31 AM   Specimen: Nasopharyngeal Swab; Nasopharyngeal(NP) swabs in vial transport medium  Result Value Ref Range Status   SARS Coronavirus 2 by RT PCR NEGATIVE NEGATIVE Final    Comment: (NOTE) SARS-CoV-2 target nucleic acids are NOT DETECTED.  The SARS-CoV-2 RNA is generally detectable in upper respiratory specimens during the acute phase of infection. The lowest concentration of SARS-CoV-2 viral copies this assay can detect is 138 copies/mL. A negative result does not preclude SARS-Cov-2 infection and should not be used as the sole basis for treatment or other patient management decisions. A negative result may occur with  improper specimen collection/handling, submission of specimen other than nasopharyngeal swab, presence of viral mutation(s) within the areas targeted by this assay, and inadequate number of viral copies(<138 copies/mL). A negative result must be combined with clinical observations, patient history, and epidemiological information. The expected  result is Negative.  Fact Sheet for Patients:  BloggerCourse.com  Fact Sheet for Healthcare Providers:  SeriousBroker.it  This test is no t yet approved or cleared by the [place] FDA and  has been authorized for detection and/or diagnosis of SARS-CoV-2 by FDA under an Emergency Use Authorization (EUA). This EUA will remain  in effect (meaning this test can be used) for the duration of the COVID-19 declaration under Section 564(b)(1) of the Act, 21 U.S.C.section 360bbb-3(b)(1), unless the authorization is terminated  or revoked sooner.       Influenza A by PCR NEGATIVE NEGATIVE Final   Influenza B by PCR NEGATIVE NEGATIVE Final    Comment: (NOTE) The Xpert Xpress SARS-CoV-2/FLU/RSV plus assay is intended as an aid in the diagnosis of influenza from Nasopharyngeal swab specimens and should not be used as a sole basis for treatment. Nasal washings and aspirates are unacceptable for Xpert Xpress SARS-CoV-2/FLU/RSV testing.  Fact Sheet for Patients: BloggerCourse.com  Fact Sheet for Healthcare Providers: SeriousBroker.it  This test is not yet approved or cleared by the [place] FDA and has been authorized for detection and/or diagnosis of SARS-CoV-2 by FDA under an Emergency Use  Authorization (EUA). This EUA will remain in effect (meaning this test can be used) for the duration of the COVID-19 declaration under Section 564(b)(1) of the Act, 21 U.S.C. section 360bbb-3(b)(1), unless the authorization is terminated or revoked.  Performed at [facility], [address] [place], [place] [address]      Labs: BNP (last 3 results) Recent Labs    10/07/20 2230 10/13/20 2244 01/05/21 0431  BNP 595.2* 776.4* 184.1*   Basic Metabolic Panel: Recent Labs  Lab 01/05/21 0431  NA 139  K 3.7  CL 106  CO2 25  GLUCOSE 110*  BUN 28*  CREATININE 1.20  CALCIUM  9.0  MG 1.9   Liver Function Tests: Recent Labs  Lab 01/05/21 0431  AST 45*  ALT 32  ALKPHOS 54  BILITOT 0.8  PROT 7.3  ALBUMIN 4.1   No results for input(s): LIPASE, AMYLASE in the last 168 hours. No results for input(s): AMMONIA in the last 168 hours. CBC: Recent Labs  Lab 01/05/21 0431  WBC 6.4  NEUTROABS 3.6  HGB 14.2  HCT 40.5  MCV 92.0  PLT 238   Cardiac Enzymes: No results for input(s): CKTOTAL, CKMB, CKMBINDEX, TROPONINI in the last 168 hours. BNP: Invalid input(s): POCBNP CBG: No results for input(s): GLUCAP in the last 168 hours. D-Dimer No results for input(s): DDIMER in the last 72 hours. Hgb A1c No results for input(s): HGBA1C in the last 72 hours. Lipid Profile No results for input(s): CHOL, HDL, LDLCALC, TRIG, CHOLHDL, LDLDIRECT in the last 72 hours. Thyroid function studies Recent Labs    01/05/21 0431  TSH 66.000*   Anemia work up No results for input(s): VITAMINB12, FOLATE, FERRITIN, TIBC, IRON, RETICCTPCT in the last 72 hours. Urinalysis    Component Value Date/Time   COLORURINE YELLOW (A) 10/13/2020 2244   APPEARANCEUR CLEAR (A) 10/13/2020 2244   LABSPEC 1.018 10/13/2020 2244   PHURINE 6.0 10/13/2020 2244   GLUCOSEU 50 (A) 10/13/2020 2244   HGBUR NEGATIVE 10/13/2020 2244   BILIRUBINUR NEGATIVE 10/13/2020 2244   KETONESUR NEGATIVE 10/13/2020 2244   PROTEINUR NEGATIVE 10/13/2020 2244   NITRITE NEGATIVE 10/13/2020 2244   LEUKOCYTESUR NEGATIVE 10/13/2020 2244   Sepsis Labs Invalid input(s): PROCALCITONIN,  WBC,  LACTICIDVEN Microbiology Recent Results (from the past 240 hour(s))  Resp Panel by RT-PCR (Flu A&B, Covid) Nasopharyngeal Swab     Status: None   Collection Time: 01/05/21  4:31 AM   Specimen: Nasopharyngeal Swab; Nasopharyngeal(NP) swabs in vial transport medium  Result Value Ref Range Status   SARS Coronavirus 2 by RT PCR NEGATIVE NEGATIVE Final    Comment: (NOTE) SARS-CoV-2 target nucleic acids are NOT DETECTED.  The  SARS-CoV-2 RNA is generally detectable in upper respiratory specimens during the acute phase of infection. The lowest concentration of SARS-CoV-2 viral copies this assay can detect is 138 copies/mL. A negative result does not preclude SARS-Cov-2 infection and should not be used as the sole basis for treatment or other patient management decisions. A negative result may occur with  improper specimen collection/handling, submission of specimen other than nasopharyngeal swab, presence of viral mutation(s) within the areas targeted by this assay, and inadequate number of viral copies(<138 copies/mL). A negative result must be combined with clinical observations, patient history, and epidemiological information. The expected result is Negative.  Fact Sheet for Patients:  BloggerCourse.com  Fact Sheet for Healthcare Providers:  SeriousBroker.it  This test is no t yet approved or cleared by the [place] FDA and  has  been authorized for detection and/or diagnosis of SARS-CoV-2 by FDA under an Emergency Use Authorization (EUA). This EUA will remain  in effect (meaning this test can be used) for the duration of the COVID-19 declaration under Section 564(b)(1) of the Act, 21 U.S.C.section 360bbb-3(b)(1), unless the authorization is terminated  or revoked sooner.       Influenza A by PCR NEGATIVE NEGATIVE Final   Influenza B by PCR NEGATIVE NEGATIVE Final    Comment: (NOTE) The Xpert Xpress SARS-CoV-2/FLU/RSV plus assay is intended as an aid in the diagnosis of influenza from Nasopharyngeal swab specimens and should not be used as a sole basis for treatment. Nasal washings and aspirates are unacceptable for Xpert Xpress SARS-CoV-2/FLU/RSV testing.  Fact Sheet for Patients: BloggerCourse.com  Fact Sheet for Healthcare Providers: SeriousBroker.it  This test is not yet approved or  cleared by the [place] FDA and has been authorized for detection and/or diagnosis of SARS-CoV-2 by FDA under an Emergency Use Authorization (EUA). This EUA will remain in effect (meaning this test can be used) for the duration of the COVID-19 declaration under Section 564(b)(1) of the Act, 21 U.S.C. section 360bbb-3(b)(1), unless the authorization is terminated or revoked.  Performed at [facility], [address] [place], [place] [address]     Time coordinating discharge:  25 minutes.   SIGNED:  Lorretta Harp, MD [facility] 01/05/2021, 12:53 PM   If 7PM-7AM, please contact night-coverage [email or web address]

## 2021-01-05 NOTE — ED Notes (Signed)
Troponin sent. Now it appears this may have been discontinued.

## 2021-01-05 NOTE — H&P (Signed)
History and Physical    Douglas Conway BSW:[id number] DOB: 12-29-1955 DOA: 01/05/2021  Referring MD/NP/PA:   PCP: Pcp, No   Patient coming from:  The patient is coming from home.  At baseline, pt is independent for most of ADL.        Chief Complaint: Irregular heartbeat  HPI: Douglas Conway is a 65 y.o. 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   Pt states that he has multiple episode of irregular heartbeat in the past 2 days.  He never lost consciousness.  He was shocked by LifeVest at least once at home. He had another episode of irregular heartbeat, he was shocked by EMS, but refused to be transported to emergency room. Last night, he has had a more prolonged episode and he was unable to make himself use the LifeVest, so he called EMS for them to defibrillate him. He was administered Versed 2 mg IV and then defibrillated with 100 J, back into sinus rhythm. IV amiodarone gtt is started in ED. Pt arrives somnolent, but is arousable. He is orientated x3 when waken up.  He moves all extremities normally.  Patient denies chest pain, cough, shortness of breath.  Denies nausea, vomiting, diarrhea or abdominal pain.  No symptoms of UTI. He is still wearing the LifeVest. Of note, pt states that he took steroid for thrombocytopenia in the past.  ED Course: pt was found to have troponin level 18, 22, INR 1.0, negative COVID PCR, alcohol level less than 10, UDS positive for cocaine and benzo, GFR > 60, magnesium 1.9, potassium 3.7, temperature 97.1, blood pressure 151/108, heart rate 83 currently, RR 20, oxygen saturation 94% on room air.  Patient is placed on progressive bed for observation. Dr. Saunders Revel of cardiology is consulted  Review of Systems:   General: no  fevers, chills, no body weight gain, has fatigue HEENT: no blurry vision, hearing changes or sore throat Respiratory: no dyspnea, coughing, wheezing CV: no chest pain, has palpitations GI: no nausea, vomiting, abdominal pain, diarrhea, constipation GU: no dysuria, burning on urination, increased urinary frequency, hematuria  Ext: no leg edema Neuro: no unilateral weakness, numbness, or tingling, no vision change or hearing loss Skin: no rash, no skin tear. MSK: No muscle spasm, no deformity, no limitation of range of movement in spin Heme: No easy bruising.  Travel history: No recent long distant travel.  Allergy:  Allergies  Allergen Reactions   Plavix [Clopidogrel Bisulfate] Rash    Past Medical History:  Diagnosis Date   Change in hearing, bilateral 46/65/9935   Chronic systolic congestive heart failure ([place])    a. 06/2019 Echo: EF 30-35%, glob HK. gr1 DD. Mod elev RVSP. Midly dil LA, Triv MR/AI. Asc Ao 52m; b. 09/2020 Echo: EF<20%, glob HK. Sev red RV fxn. RVSP 48.452mg. Mod dil LA. Sev dil RA. Mild MR.   Coronary artery disease    a. 03/2005 Ant STEMI s/p PCI/DES to the LAD; b. 05/2010 St echo: reportedly nl; c. 09/2020 Cath: LM nl, LAD 100p/m, D1 min irregs, LCX min irregs, RCA 20p. Dist LAD fills via collats from RPDA-->Med Rx.   [place]rug [place] ([facility]   a. cocaine/methamphetamines.   [place]   Hyperlipidemia LDL goal <70    Hypertension 06/28/2018   Hypothyroidism 01/07/2019   Ischemic cardiomyopathy  a.  06/2019 Echo: EF 30-35%; 09/2020 Echo: EF<20%, glob HK.   Lateral epicondylitis 01/07/2019   Noncompliance    STEMI (ST elevation myocardial infarction) ([place])    Tobacco abuse    Wide-complex tachycardia ([place])    a. 10/2020 - SVT w/ aberrancy vs VT-->s/p DCCV.    Past Surgical History:  Procedure Laterality Date   CORONARY ANGIOPLASTY WITH STENT PLACEMENT     LEFT HEART CATH AND CORONARY ANGIOGRAPHY N/A 10/08/2020   Procedure: LEFT HEART CATH AND  CORONARY ANGIOGRAPHY;  Surgeon: Wellington Hampshire, MD;  Location: [facility];  Service: Cardiovascular;  Laterality: N/A;    Social History:  reports that he has been smoking cigarettes. He started smoking about 53 years ago. He has a 42.00 pack-year smoking history. He has never used smokeless tobacco. He reports current alcohol use of about 1.0 standard drink per week. He reports that he does not currently use drugs after having used the following drugs: Methamphetamines.  Family History:  Family History  Problem Relation Age of Onset   Diabetes Mellitus II Father      Prior to Admission medications   Medication Sig Start Date End Date Taking? Authorizing Provider  amiodarone (PACERONE) 200 MG tablet Take 1 tablet (200 mg total) by mouth daily. 11/13/20   Alisa Graff, FNP  aspirin 81 MG EC tablet Take 1 tablet (81 mg total) by mouth daily. Swallow whole. 11/16/20   Alisa Graff, FNP  atorvastatin (LIPITOR) 20 MG tablet Take 1 tablet (20 mg total) by mouth daily. 11/16/20   Alisa Graff, FNP  cabotegravir & rilpivirine ER (CABENUVA) 600 & 900 MG/3ML injection Inject 1 kit into the muscle every 2 (two) months. 11/09/20   Kuppelweiser, Cassie L, RPH-CPP  Cholecalciferol (VITAMIN D-3) 125 MCG (5000 UT) TABS Take 5,000 Units by mouth daily.    [provider]  levothyroxine (SYNTHROID) 175 MCG tablet TAKE 1 TABLET BY MOUTH ONCE DAILY BEFORE BREAKFAST 01/01/21   Golden Circle, FNP  losartan (COZAAR) 25 MG tablet Take 1 tablet (25 mg total) by mouth daily. 10/30/20   Sueanne Margarita, MD  methocarbamol (ROBAXIN) 500 MG tablet Take 1 tablet (500 mg total) by mouth every 6 (six) hours as needed for muscle spasms. 11/26/20   Persons, Bevely Palmer, PA  metoprolol succinate (TOPROL XL) 25 MG 24 hr tablet Take 0.5 tablets (12.5 mg total) by mouth daily. 12/30/20   Alisa Graff, FNP  polyethylene glycol (MIRALAX / GLYCOLAX) 17 g packet Take 17 g by mouth daily as needed for moderate  constipation. 10/16/20   Loletha Grayer, MD  predniSONE (DELTASONE) 10 MG tablet Take 1 tablet (10 mg total) by mouth daily with breakfast. 11/26/20   Persons, Bevely Palmer, PA  sildenafil (VIAGRA) 50 MG tablet Take 1 tablet (50 mg total) by mouth daily as needed for erectile dysfunction. 11/16/20   Golden Circle, FNP  spironolactone (ALDACTONE) 25 MG tablet Take 0.5 tablets (12.5 mg total) by mouth daily. 12/11/20 03/11/21  Theora Gianotti, NP    Physical Exam: Vitals:   01/05/21 0430 01/05/21 0605 01/05/21 0630 01/05/21 0730  BP: (!) 137/98 (!) 140/104 (!) 151/108 (!) 143/102  Pulse: 78 74 83 76  Resp: '20 15 18 ' (!) 21  Temp: (!) 97.1 F (36.2 C)     TempSrc: Oral     SpO2: 99% 94% 95% 91%   General: Not in acute distress.   HEENT:  Eyes: PERRL, EOMI, no scleral icterus.       ENT: No discharge from the ears and nose, no pharynx injection, no tonsillar enlargement.        Neck: No JVD, no bruit, no mass felt. Heme: No neck lymph node enlargement. Cardiac: S1/S2, RRR, No murmurs, No gallops or rubs. Respiratory: No rales, wheezing, rhonchi or rubs. GI: Soft, nondistended, nontender, no rebound pain, no organomegaly, BS present. GU: No hematuria Ext: No pitting leg edema bilaterally. 1+DP/PT pulse bilaterally. Musculoskeletal: No joint deformities, No joint redness or warmth, no limitation of ROM in spin. Skin: No rashes.  Neuro: Alert, oriented X3, cranial nerves II-XII grossly intact, moves all extremities normally.  Psych: Patient is not psychotic, no suicidal or hemocidal ideation.  Labs on Admission: I have personally reviewed following labs and imaging studies  CBC: Recent Labs  Lab 01/05/21 0431  WBC 6.4  NEUTROABS 3.6  HGB 14.2  HCT 40.5  MCV 92.0  PLT 366   Basic Metabolic Panel: Recent Labs  Lab 01/05/21 0431  NA 139  K 3.7  CL 106  CO2 25  GLUCOSE 110*  BUN 28*  CREATININE 1.20  CALCIUM 9.0  MG 1.9   GFR: Estimated Creatinine Clearance:  72.1 mL/min (by C-G formula based on SCr of 1.2 mg/dL). Liver Function Tests: Recent Labs  Lab 01/05/21 0431  AST 45*  ALT 32  ALKPHOS 54  BILITOT 0.8  PROT 7.3  ALBUMIN 4.1   No results for input(s): LIPASE, AMYLASE in the last 168 hours. No results for input(s): AMMONIA in the last 168 hours. Coagulation Profile: Recent Labs  Lab 01/05/21 0431  INR 1.0   Cardiac Enzymes: No results for input(s): CKTOTAL, CKMB, CKMBINDEX, TROPONINI in the last 168 hours. BNP (last 3 results) No results for input(s): PROBNP in the last 8760 hours. HbA1C: No results for input(s): HGBA1C in the last 72 hours. CBG: No results for input(s): GLUCAP in the last 168 hours. Lipid Profile: No results for input(s): CHOL, HDL, LDLCALC, TRIG, CHOLHDL, LDLDIRECT in the last 72 hours. Thyroid Function Tests: No results for input(s): TSH, T4TOTAL, FREET4, T3FREE, THYROIDAB in the last 72 hours. Anemia Panel: No results for input(s): VITAMINB12, FOLATE, FERRITIN, TIBC, IRON, RETICCTPCT in the last 72 hours. Urine analysis:    Component Value Date/Time   COLORURINE YELLOW (A) 10/13/2020 2244   APPEARANCEUR CLEAR (A) 10/13/2020 2244   LABSPEC 1.018 10/13/2020 2244   PHURINE 6.0 10/13/2020 2244   GLUCOSEU 50 (A) 10/13/2020 2244   HGBUR NEGATIVE 10/13/2020 2244   BILIRUBINUR NEGATIVE 10/13/2020 2244   KETONESUR NEGATIVE 10/13/2020 2244   PROTEINUR NEGATIVE 10/13/2020 2244   NITRITE NEGATIVE 10/13/2020 2244   LEUKOCYTESUR NEGATIVE 10/13/2020 2244   Sepsis Labs: '@LABRCNTIP' (procalcitonin:4,lacticidven:4) ) Recent Results (from the past 240 hour(s))  Resp Panel by RT-PCR (Flu A&B, Covid) Nasopharyngeal Swab     Status: None   Collection Time: 01/05/21  4:31 AM   Specimen: Nasopharyngeal Swab; Nasopharyngeal(NP) swabs in vial transport medium  Result Value Ref Range Status   SARS Coronavirus 2 by RT PCR NEGATIVE NEGATIVE Final    Comment: (NOTE) SARS-CoV-2 target nucleic acids are NOT  DETECTED.  The SARS-CoV-2 RNA is generally detectable in upper respiratory specimens during the acute phase of infection. The lowest concentration of SARS-CoV-2 viral copies this assay can detect is 138 copies/mL. A negative result does not preclude SARS-Cov-2 infection and should not be used as the sole basis for treatment or other patient management decisions. A  negative result may occur with  improper specimen collection/handling, submission of specimen other than nasopharyngeal swab, presence of viral mutation(s) within the areas targeted by this assay, and inadequate number of viral copies(<138 copies/mL). A negative result must be combined with clinical observations, patient history, and epidemiological information. The expected result is Negative.  Fact Sheet for Patients:  EntrepreneurPulse.com.au  Fact Sheet for Healthcare Providers:  IncredibleEmployment.be  This test is no t yet approved or cleared by the [place] FDA and  has been authorized for detection and/or diagnosis of SARS-CoV-2 by FDA under an Emergency Use Authorization (EUA). This EUA will remain  in effect (meaning this test can be used) for the duration of the COVID-19 declaration under Section 564(b)(1) of the Act, 21 U.S.C.section 360bbb-3(b)(1), unless the authorization is terminated  or revoked sooner.       Influenza A by PCR NEGATIVE NEGATIVE Final   Influenza B by PCR NEGATIVE NEGATIVE Final    Comment: (NOTE) The Xpert Xpress SARS-CoV-2/FLU/RSV plus assay is intended as an aid in the diagnosis of influenza from Nasopharyngeal swab specimens and should not be used as a sole basis for treatment. Nasal washings and aspirates are unacceptable for Xpert Xpress SARS-CoV-2/FLU/RSV testing.  Fact Sheet for Patients: EntrepreneurPulse.com.au  Fact Sheet for Healthcare Providers: IncredibleEmployment.be  This test is not yet  approved or cleared by the [place] FDA and has been authorized for detection and/or diagnosis of SARS-CoV-2 by FDA under an Emergency Use Authorization (EUA). This EUA will remain in effect (meaning this test can be used) for the duration of the COVID-19 declaration under Section 564(b)(1) of the Act, 21 U.S.C. section 360bbb-3(b)(1), unless the authorization is terminated or revoked.  Performed at [facility], [address], [place], [place] [address]      Radiological Exams on Admission: No results found.   EKG: I have personally reviewed.  Sinus rhythm, QTC 506, heart rate 79, low voltage, poor R wave progression, ST depression in lateral leads   Assessment/Plan Principal Problem:   V tach (HCC) Active Problems:   HIV infection (HCC)   Hypertension   Hypothyroidism   Chronic systolic CHF (congestive heart failure) (HCC)   Wide-complex tachycardia (HCC)   Elevated troponin   Hyperlipidemia   Tobacco abuse   Coronary artery disease   Polysubstance abuse ([place])   V tach (HCC) and history of wide-complex tachycardia: pt still has LifeVest. Currently pt is in sinus rhythm. HR 80-90s. No CP. Consulted Dr. Saunders Revel of card.  -Placed on progressive bed for observation -Continue IV amiodarone drip -Potassium 3.7, will give 40 mEq of potassium -Magnesium is 1.9, will give 1 g of magnesium sulfate -Cardiac monitoring -Check TSH -Hold metoprolol due to cocaine abuse  HIV infection ([place]) -Continue home medications  Hypertension -IV hydralazine as needed -Continue Cozaar, spironolactone  Hypothyroidism -Synthroid -Check TSH  Chronic systolic CHF (congestive heart failure) ([place]): 2D echo on 10/08/2020 showed EF <20%.  Patient does not have leg edema or JVD.  CHF seem to be compensated -Continue spironolactone  Elevated troponin and CAD: Troponin level 18, 22, likely due to demand ischemia and cocaine abuse -Aspirin, Lipitor -Trend troponin -Check A1c,  FLP  Hyperlipidemia -Lipitor  Tobacco abuse -Nicotine patch  Polysubstance abuse ([place]): UDS positive for cocaine. -Did counseling about importance of quitting substance use         DVT ppx: SCD Code Status: Full code Family Communication: not done, no family member is at bed side.   Disposition Plan:  Anticipate discharge back to previous environment Consults called:  Dr. Saunders Revel of card Admission status and Level of care: Progressive Cardiac:  for obs       Status is: Observation  The patient remains OBS appropriate and will d/c before 2 midnights.  Dispo: The patient is from: Home              Anticipated d/c is to: Home              Patient currently is not medically stable to d/c.   Difficult to place patient No        Date of Service 01/05/2021    [facility]   If 7PM-7AM, please contact night-coverage [email or web address] 01/05/2021, 8:29 AM

## 2021-01-05 NOTE — ED Provider Notes (Signed)
[facility] Emergency Department Provider Note  ____________________________________________   Event Date/Time   First MD Initiated Contact with Patient 01/05/21 0421     (approximate)  I have reviewed the triage vital signs and the nursing notes.   HISTORY  Chief Complaint Irregular Heart Beat  Level 5 caveat:  history/ROS limited by acute/critical illness  HPI Douglas Conway is a 65 y.o. male with a history that includes severe chronic heart failure with a history of ventricular tachycardia and who has a LifeVest.  He also has a history of polysubstance abuse.  He presents by EMS after multiple episodes within the last 1 to 2 days of ventricular tachycardia.  He does not lose consciousness.  He does not like the way it feels when the LifeVest shocked him.  He has been shocked at least once previously and then EMS came out for another episode.  He insisted they defibrillated him and then he refused transfer to the ED.  Tonight he has had a more prolonged episode and he was unable to make himself use the LifeVest, so he called EMS for them to defibrillate him.  He was administered Versed 2 mg IV and then defibrillated with 100 J back into a sinus rhythm.  He arrives somnolent but arousable to painful stimuli in sinus rhythm, still wearing the LifeVest.  Based on the paramedic reports, we believe he has received a total of 3 defibrillations without about 24 hours, including one from his LifeVest, one from the initial EMS crew, and the third from the EMS crew that brought him to the ED.    Past Medical History:  Diagnosis Date   Change in hearing, bilateral 87/68/1157   Chronic systolic congestive heart failure ([place])    a. 06/2019 Echo: EF 30-35%, glob HK. gr1 DD. Mod elev RVSP. Midly dil LA, Triv MR/AI. Asc Ao 17m; b. 09/2020 Echo: EF<20%, glob HK. Sev red RV fxn. RVSP 48.420mg. Mod dil LA. Sev dil RA. Mild MR.   Coronary artery disease    a. 03/2005 Ant STEMI  s/p PCI/DES to the LAD; b. 05/2010 St echo: reportedly nl; c. 09/2020 Cath: LM nl, LAD 100p/m, D1 min irregs, LCX min irregs, RCA 20p. Dist LAD fills via collats from RPDA-->Med Rx.   Drug abuse ([facility]Al[facility]   a. cocaine/methamphetamines.   HIV disease (HCNekoma03/19/2020   Hyperlipidemia LDL goal <70    Hypertension 06/28/2018   Hypothyroidism 01/07/2019   Ischemic cardiomyopathy    a.  06/2019 Echo: EF 30-35%; 09/2020 Echo: EF<20%, glob HK.   Lateral epicondylitis 01/07/2019   Noncompliance    STEMI (ST elevation myocardial infarction) (HCHenry   Tobacco abuse    Wide-complex tachycardia (HCIvan[place]   a. 10/2020 - SVT w/ aberrancy vs VT-->s/p DCCV.    Patient Active Problem List   Diagnosis Date Noted   V tach (HCAmite09/27/2022   Tobacco abuse    Coronary artery disease    Polysubstance abuse (HCStockton   Erectile dysfunction 11/16/2020   NSTEMI (non-ST elevated myocardial infarction) (HCBeggs   Hyperlipidemia    Vitamin D deficiency    Wide-complex tachycardia (HCC)    HFrEF (heart failure with reduced ejection fraction) (HCC)    Elevated troponin    Unstable angina (HCBelvidere   Acute on chronic systolic heart failure (HCC)    Ventricular tachyarrhythmia (HCAngola06/29/2022   Shortness of breath 09/14/2020   Pain and swelling of knee, right 09/14/2020   Sciatica of  left side 33/35/4562   Chronic systolic CHF (congestive heart failure) ([place]) 05/14/2019   Hypothyroidism 01/07/2019   HIV infection ([place]) 06/28/2018   Change in hearing, bilateral 06/28/2018   Hypertension 06/28/2018   Healthcare maintenance 06/28/2018    Past Surgical History:  Procedure Laterality Date   CORONARY ANGIOPLASTY WITH STENT PLACEMENT     LEFT HEART CATH AND CORONARY ANGIOGRAPHY N/A 10/08/2020   Procedure: LEFT HEART CATH AND CORONARY ANGIOGRAPHY;  Surgeon: Wellington Hampshire, MD;  Location: [facility];  Service: Cardiovascular;  Laterality: N/A;    Prior to Admission medications   Medication Sig Start Date End  Date Taking? Authorizing Provider  amiodarone (PACERONE) 200 MG tablet Take 1 tablet (200 mg total) by mouth daily. 11/13/20   Alisa Graff, FNP  aspirin 81 MG EC tablet Take 1 tablet (81 mg total) by mouth daily. Swallow whole. 11/16/20   Alisa Graff, FNP  atorvastatin (LIPITOR) 20 MG tablet Take 1 tablet (20 mg total) by mouth daily. 11/16/20   Alisa Graff, FNP  cabotegravir & rilpivirine ER (CABENUVA) 600 & 900 MG/3ML injection Inject 1 kit into the muscle every 2 (two) months. 11/09/20   Kuppelweiser, Cassie L, RPH-CPP  Cholecalciferol (VITAMIN D-3) 125 MCG (5000 UT) TABS Take 5,000 Units by mouth daily.    [provider]  levothyroxine (SYNTHROID) 175 MCG tablet TAKE 1 TABLET BY MOUTH ONCE DAILY BEFORE BREAKFAST 01/01/21   Golden Circle, FNP  losartan (COZAAR) 25 MG tablet Take 1 tablet (25 mg total) by mouth daily. 10/30/20   Sueanne Margarita, MD  methocarbamol (ROBAXIN) 500 MG tablet Take 1 tablet (500 mg total) by mouth every 6 (six) hours as needed for muscle spasms. 11/26/20   Persons, Bevely Palmer, PA  metoprolol succinate (TOPROL XL) 25 MG 24 hr tablet Take 0.5 tablets (12.5 mg total) by mouth daily. 12/30/20   Alisa Graff, FNP  polyethylene glycol (MIRALAX / GLYCOLAX) 17 g packet Take 17 g by mouth daily as needed for moderate constipation. 10/16/20   Loletha Grayer, MD  predniSONE (DELTASONE) 10 MG tablet Take 1 tablet (10 mg total) by mouth daily with breakfast. 11/26/20   Persons, Bevely Palmer, PA  sildenafil (VIAGRA) 50 MG tablet Take 1 tablet (50 mg total) by mouth daily as needed for erectile dysfunction. 11/16/20   Golden Circle, FNP  spironolactone (ALDACTONE) 25 MG tablet Take 0.5 tablets (12.5 mg total) by mouth daily. 12/11/20 03/11/21  Theora Gianotti, NP    Allergies Plavix [clopidogrel bisulfate]  History reviewed. No pertinent family history.  Social History Social History   Tobacco Use   Smoking status: Every Day    Packs/day: 1.00    Years:  42.00    Pack years: 42.00    Types: Cigarettes    Start date: 01/07/1968   Smokeless tobacco: Never   Tobacco comments:    Smoked 1 ppd for most of his adult life but now smoking 2 cigarettes daily  Vaping Use   Vaping Use: Never used  Substance Use Topics   Alcohol use: Yes    Alcohol/week: 1.0 standard drink    Types: 1 Cans of beer per week    Comment: 1 can/beer/daily   Drug use: Not Currently    Types: Methamphetamines    Comment: smokes or snorts crystal meth 2-3x/wk.    Review of Systems Level 5 caveat:  history/ROS limited by acute/critical illness    ____________________________________________   PHYSICAL EXAM:  VITAL SIGNS: ED Triage Vitals  Enc Vitals Group     BP 01/05/21 0430 (!) 137/98     Pulse Rate 01/05/21 0430 78     Resp 01/05/21 0430 20     Temp 01/05/21 0430 (!) 97.1 F (36.2 C)     Temp Source 01/05/21 0430 Oral     SpO2 01/05/21 0427 95 %     Weight --      Height --      Head Circumference --      Peak Flow --      Pain Score 01/05/21 0430 0     Pain Loc --      Pain Edu? --      Excl. in [place]? --     Constitutional: Somnolent but awakens to painful stimuli. Eyes: Conjunctivae are normal.  Head: Atraumatic. Nose: No congestion/rhinnorhea. Mouth/Throat: Patient is wearing a mask. Neck: No stridor.  No meningeal signs.   Cardiovascular: Sinus rhythm, regular rate. Respiratory: Normal respiratory effort.  No retractions. Gastrointestinal: Soft and nontender. No distention.  Musculoskeletal: No lower extremity tenderness nor edema. No gross deformities of extremities. Neurologic:  Normal speech and language. No gross focal neurologic deficits are appreciated.  Skin:  Skin is warm, dry and intact.   ____________________________________________   LABS (all labs ordered are listed, but only abnormal results are displayed)  Labs Reviewed  COMPREHENSIVE METABOLIC PANEL - Abnormal; Notable for the following components:      Result Value    Glucose, Bld 110 (*)    BUN 28 (*)    AST 45 (*)    All other components within normal limits  URINE DRUG SCREEN, QUALITATIVE ([facility] ONLY) - Abnormal; Notable for the following components:   Amphetamines, Ur Screen POSITIVE (*)    Cocaine Metabolite,Ur [place] POSITIVE (*)    Benzodiazepine, Ur Scrn POSITIVE (*)    All other components within normal limits  TROPONIN I (HIGH SENSITIVITY) - Abnormal; Notable for the following components:   Troponin I (High Sensitivity) 18 (*)    All other components within normal limits  TROPONIN I (HIGH SENSITIVITY) - Abnormal; Notable for the following components:   Troponin I (High Sensitivity) 22 (*)    All other components within normal limits  RESP PANEL BY RT-PCR (FLU A&B, COVID) ARPGX2  MAGNESIUM  CBC WITH DIFFERENTIAL/PLATELET  PROTIME-INR  ETHANOL  BRAIN NATRIURETIC PEPTIDE  TSH  HEMOGLOBIN A1C  TROPONIN I (HIGH SENSITIVITY)   ____________________________________________  EKG  ED ECG REPORT I, Hinda Kehr, the attending physician, personally viewed and interpreted this ECG.  Date: 01/05/2021 EKG Time: 4:23 AM Rate: 79 Rhythm: Sinus rhythm with first-degree AV block QRS Axis: normal Intervals: Prolonged PR interval at 247 ms with also prolonged QTc interval at 506 ms. ST/T Wave abnormalities: Non-specific ST segment / T-wave changes, but no clear evidence of acute ischemia. Narrative Interpretation: no definitive evidence of acute ischemia; does not meet STEMI criteria.  ____________________________________________   PROCEDURES   Procedure(s) performed (including Critical Care):  .1-3 Lead EKG Interpretation Performed by: Hinda Kehr, MD Authorized by: Hinda Kehr, MD     Interpretation: normal     ECG rate:  90   ECG rate assessment: normal     Rhythm: sinus rhythm     Ectopy: none     Conduction: normal   .Critical Care Performed by: Hinda Kehr, MD Authorized by: Hinda Kehr, MD   Critical care provider  statement:    Critical care time (minutes):  30  Critical care time was exclusive of:  Separately billable procedures and treating other patients   Critical care was necessary to treat or prevent imminent or life-threatening deterioration of the following conditions:  Cardiac failure   Critical care was time spent personally by me on the following activities:  Development of treatment plan with patient or surrogate, discussions with consultants, evaluation of patient's response to treatment, examination of patient, obtaining history from patient or surrogate, ordering and performing treatments and interventions, ordering and review of laboratory studies, ordering and review of radiographic studies, pulse oximetry, re-evaluation of patient's condition and review of old charts   ____________________________________________   [place] / MDM / [place] / ED COURSE  As part of my medical decision making, I reviewed the following data within the [place] notes reviewed and incorporated, Labs reviewed , EKG interpreted , Old chart reviewed, Discussed with admitting physician , and Notes from prior ED visits   Differential diagnosis includes, but is not limited to, ventricular tachycardia, electrolyte or metabolic abnormality, other nonspecific cardiac arrhythmia, substance abuse.  The patient is on the cardiac monitor to evaluate for evidence of arrhythmia and/or significant heart rate changes.  Patient is currently in sinus rhythm.  He was placed on nasal with anterior and posterior pads.  He is somnolent and we are letting him sleep.  Labs pending.  Because of the multiple episodes of ventricular tachycardia within the last 24+ hours, I am starting amiodarone 150 mg IV bolus plus infusion and anticipate admission.     Clinical Course as of 01/05/21 0801  Tue Jan 05, 2021  0545 Patient continues to sleep.  Comprehensive metabolic panel is generally  reassuring.  Magnesium level is normal.  High-sensitivity troponin is slightly elevated which is to be expected.  No urine has been collected yet.  CBC is normal. [CF]  6294 I discussed the case by phone with Dr. Blaine Hamper with the hospitalist service and he will admit the patient for further cardiac monitoring and evaluation. [CF]    Clinical Course User Index [CF] Hinda Kehr, MD     ____________________________________________  FINAL CLINICAL IMPRESSION(S) / ED DIAGNOSES  Final diagnoses:  Ventricular tachycardia ([place])  Polysubstance abuse ([place])  Cardiomyopathy, unspecified type ([place])     MEDICATIONS GIVEN DURING THIS VISIT:  Medications  amiodarone (NEXTERONE) 1.8 mg/mL load via infusion 150 mg (150 mg Intravenous Bolus from Bag 01/05/21 0528)    Followed by  amiodarone (NEXTERONE PREMIX) 360-4.14 MG/200ML-% (1.8 mg/mL) IV infusion (60 mg/hr Intravenous New Bag/Given 01/05/21 0533)    Followed by  amiodarone (NEXTERONE PREMIX) 360-4.14 MG/200ML-% (1.8 mg/mL) IV infusion (has no administration in time range)  potassium chloride SA (KLOR-CON) CR tablet 40 mEq (has no administration in time range)  magnesium sulfate IVPB 1 g 100 mL (has no administration in time range)  acetaminophen (TYLENOL) tablet 650 mg (has no administration in time range)  hydrALAZINE (APRESOLINE) injection 5 mg (has no administration in time range)  nicotine (NICODERM CQ - dosed in mg/24 hours) patch 21 mg (has no administration in time range)     ED Discharge Orders     None        Note:  This document was prepared using Dragon voice recognition software and may include unintentional dictation errors.   Hinda Kehr, MD 01/05/21 [phone]

## 2021-01-06 ENCOUNTER — Telehealth: Payer: Self-pay | Admitting: *Deleted

## 2021-01-06 ENCOUNTER — Telehealth: Payer: Self-pay | Admitting: Physician Assistant

## 2021-01-06 ENCOUNTER — Telehealth: Payer: Self-pay | Admitting: Family

## 2021-01-06 NOTE — Telephone Encounter (Signed)
Duplicate, Error
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
## 2021-01-06 NOTE — Telephone Encounter (Signed)
See hospital admission 01/05/2021. Strips reviewed at that time. Patient left AMA.

## 2021-01-06 NOTE — Telephone Encounter (Signed)
-----   Message from Bryna Colander, RN sent at 01/06/2021  1:17 PM EDT -----  ----- Message ----- From: Norman Herrlich Sent: 01/06/2021  10:52 AM EDT To: Bryna Colander, RN

## 2021-01-07 ENCOUNTER — Ambulatory Visit: Payer: Medicaid Other | Admitting: Internal Medicine

## 2021-01-11 ENCOUNTER — Other Ambulatory Visit (HOSPITAL_COMMUNITY): Payer: Self-pay

## 2021-01-12 ENCOUNTER — Telehealth: Payer: Self-pay

## 2021-01-12 NOTE — Telephone Encounter (Signed)
[facility] Patient Advocate Encounter  Patient's medication Douglas Conway) have been couriered to [facility] from [employer] and will administered on patient next office visit on 01/18/21.  Douglas Conway , CPhT Specialty Pharmacy Patient [facility] for Infectious Disease Phone: [phone] Fax:  [phone]

## 2021-01-15 ENCOUNTER — Telehealth: Payer: Self-pay | Admitting: Cardiovascular Disease

## 2021-01-15 NOTE — Telephone Encounter (Signed)
[employer] Life vest calling Patients LifeVest order will expire tomorrow Would like to know if he is still wearing one or if he no longer needs Please call to discuss

## 2021-01-15 NOTE — Telephone Encounter (Signed)
Discussed with Eula Listen, PA.  Patients life vest should be extended. Alycia Rossetti sts that he signed an order to extend 3 weeks ago and gave  it to the zoll rep Alvino Chapel. Sherron Monday [phone].  Returned the call to [place] with [employer] and made her aware of the above. She will f/u with Alvino Chapel to update their records.

## 2021-01-18 ENCOUNTER — Encounter: Payer: Medicaid Other | Admitting: Pharmacist

## 2021-01-19 ENCOUNTER — Other Ambulatory Visit: Payer: Self-pay

## 2021-01-19 ENCOUNTER — Ambulatory Visit (INDEPENDENT_AMBULATORY_CARE_PROVIDER_SITE_OTHER): Payer: Medicaid Other | Admitting: Pharmacist

## 2021-01-19 DIAGNOSIS — Z21 Asymptomatic human immunodeficiency virus [HIV] infection status: Secondary | ICD-10-CM

## 2021-01-19 MED ORDER — CABOTEGRAVIR & RILPIVIRINE ER 600 & 900 MG/3ML IM SUER
1.0000 | Freq: Once | INTRAMUSCULAR | Status: AC
  Administered 2021-01-19: 1 via INTRAMUSCULAR

## 2021-01-19 NOTE — Progress Notes (Signed)
01/19/2021  HPI: Douglas Conway is a 65 y.o. male who presents to the [facility] for HIV follow-up.  Patient Active Problem List   Diagnosis Date Noted   Coronary artery disease    Polysubstance abuse ([place])    Erectile dysfunction 11/16/2020   NSTEMI (non-ST elevated myocardial infarction) ([place])    Hyperlipidemia    Unstable angina (HCC)    Ventricular tachyarrhythmia 96/29/5284   Chronic systolic CHF (congestive heart failure) ([place]) 05/14/2019   Hypothyroidism 01/07/2019   HIV infection ([place]) 06/28/2018   Hypertension 06/28/2018    Patient's Medications  New Prescriptions   No medications on file  Previous Medications   AMIODARONE (PACERONE) 200 MG TABLET    Take 1 tablet (200 mg total) by mouth daily.   ASPIRIN 81 MG EC TABLET    Take 1 tablet (81 mg total) by mouth daily. Swallow whole.   ATORVASTATIN (LIPITOR) 20 MG TABLET    Take 1 tablet (20 mg total) by mouth daily.   CABOTEGRAVIR & RILPIVIRINE ER (CABENUVA) 600 & 900 MG/3ML INJECTION    Inject 1 kit into the muscle every 2 (two) months.   CHOLECALCIFEROL (VITAMIN D-3) 125 MCG (5000 UT) TABS    Take 5,000 Units by mouth daily.   CYCLOBENZAPRINE (FLEXERIL) 10 MG TABLET    Take 10 mg by mouth 3 (three) times daily as needed.   LEVOTHYROXINE (SYNTHROID) 175 MCG TABLET    TAKE 1 TABLET BY MOUTH ONCE DAILY BEFORE BREAKFAST   LOSARTAN (COZAAR) 25 MG TABLET    Take 1 tablet (25 mg total) by mouth daily.   METHOCARBAMOL (ROBAXIN) 500 MG TABLET    Take 1 tablet (500 mg total) by mouth every 6 (six) hours as needed for muscle spasms.   METOPROLOL SUCCINATE (TOPROL XL) 25 MG 24 HR TABLET    Take 0.5 tablets (12.5 mg total) by mouth daily.   POLYETHYLENE GLYCOL (MIRALAX / GLYCOLAX) 17 G PACKET    Take 17 g by mouth daily as needed for moderate constipation.   PREDNISONE (DELTASONE) 10 MG TABLET    Take 1 tablet (10 mg total) by mouth daily with breakfast.   SILDENAFIL (VIAGRA) 50 MG TABLET    Take 1 tablet (50 mg total) by  mouth daily as needed for erectile dysfunction.   SPIRONOLACTONE (ALDACTONE) 25 MG TABLET    Take 0.5 tablets (12.5 mg total) by mouth daily.  Modified Medications   No medications on file  Discontinued Medications   No medications on file    Allergies: Allergies  Allergen Reactions   Plavix [Clopidogrel Bisulfate] Rash    Past Medical History: Past Medical History:  Diagnosis Date   Change in hearing, bilateral 13/24/4010   Chronic systolic congestive heart failure ([place])    a. 06/2019 Echo: EF 30-35%, glob HK. gr1 DD. Mod elev RVSP. Midly dil LA, Triv MR/AI. Asc Ao 87m; b. 09/2020 Echo: EF<20%, glob HK. Sev red RV fxn. RVSP 48.47mg. Mod dil LA. Sev dil RA. Mild MR.   Coronary artery disease    a. 03/2005 Ant STEMI s/p PCI/DES to the LAD; b. 05/2010 St echo: reportedly nl; c. 09/2020 Cath: LM nl, LAD 100p/m, D1 min irregs, LCX min irregs, RCA 20p. Dist LAD fills via collats from RPDA-->Med Rx.   Drug abuse (HCVallejo   a. cocaine/methamphetamines.   HIV disease (HCRonkonkoma03/19/2020   Hyperlipidemia LDL goal <70    Hypertension 06/28/2018   Hypothyroidism 01/07/2019   Ischemic cardiomyopathy  a.  06/2019 Echo: EF 30-35%; 09/2020 Echo: EF<20%, glob HK.   Lateral epicondylitis 01/07/2019   Noncompliance    STEMI (ST elevation myocardial infarction) ([place])    Tobacco abuse    Wide-complex tachycardia ([place])    a. 10/2020 - SVT w/ aberrancy vs VT-->s/p DCCV.    Social History: Social History   Socioeconomic History   Marital status: Divorced    Spouse name: Not on file   Number of children: Not on file   Years of education: Not on file   Highest education level: Not on file  Occupational History   Not on file  Tobacco Use   Smoking status: Every Day    Packs/day: 1.00    Years: 42.00    Pack years: 42.00    Types: Cigarettes    Start date: 01/07/1968   Smokeless tobacco: Never   Tobacco comments:    Smoked 1 ppd for most of his adult life but now smoking 2 cigarettes daily   Vaping Use   Vaping Use: Never used  Substance and Sexual Activity   Alcohol use: Yes    Alcohol/week: 1.0 standard drink    Types: 1 Cans of beer per week    Comment: 1 can/beer/daily   Drug use: Not Currently    Types: Methamphetamines    Comment: smokes or snorts crystal meth 2-3x/wk.   Sexual activity: Not Currently    Partners: Female    Birth control/protection: Condom    Comment: given condoms  Other Topics Concern   Not on file  Social History Narrative   Lives in [place] by himself.  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   Social Determinants of Health   Financial Resource Strain: Not on file  Food Insecurity: Not on file  Transportation Needs: Not on file  Physical Activity: Not on file  Stress: Not on file  Social Connections: Not on file    Labs: Lab Results  Component Value Date   HIV1RNAQUANT <20 (H) 11/16/2020   HIV1RNAQUANT 22 (H) 06/15/2020   HIV1RNAQUANT <20 (H) 04/16/2020   CD4TABS 668 11/16/2020   CD4TABS 1,112 03/18/2020   CD4TABS 906 08/26/2019    RPR and STI Lab Results  Component Value Date   LABRPR NON-REACTIVE 11/16/2020   LABRPR NON-REACTIVE 03/18/2020   LABRPR NON-REACTIVE 01/07/2019   LABRPR NON-REACTIVE 06/13/2018    STI Results GC CT  06/13/2018 Negative Negative    Hepatitis B Lab Results  Component Value Date   HEPBSAB REACTIVE (A) 06/13/2018   HEPBSAG NON-REACTIVE 06/13/2018   HEPBCAB NON-REACTIVE 06/13/2018   Hepatitis C Lab Results  Component Value Date   HEPCAB REACTIVE (A) 06/13/2018   HCVRNAPCRQN <15 NOT DETECTED 06/13/2018   Hepatitis A Lab Results  Component Value Date   HAV REACTIVE (A) 06/13/2018   Lipids: Lab Results  Component Value Date   CHOL 106 03/18/2020   TRIG 130 03/18/2020   HDL 42 03/18/2020   CHOLHDL 2.5 03/18/2020   LDLCALC 43 03/18/2020    Current HIV Regimen: Cabenuva Injection  Assessment: Mr.Sinor presents to clinic today for his Cabenuva injection for HIV. He  continues to have well controlled HIV and good adherence and tolerance to every 2 month Cabenuva injections. He reports that he has 1 new partner since his last visit to clinic. We discussed the importance of safe sexual practice to reduce risk of STIs.Will administer Cabenuva today, check HIV RNA, and urine cytology.   Plan: Administered Cabenuva Injection Ordered HIV  RNA and Urine Cytology Follow-up with Marya Amsler on 03/18/21 @ [facility], PharmD PGY2 Infectious Diseases Pharmacy Resident

## 2021-01-20 ENCOUNTER — Telehealth: Payer: Self-pay

## 2021-01-20 MED ORDER — SILDENAFIL CITRATE 50 MG PO TABS: 50.0000 mg | ORAL_TABLET | Freq: Every day | ORAL | 0 refills | Status: DC | PRN

## 2021-01-20 NOTE — Addendum Note (Signed)
Addended by: Jeanine Luz D on: 01/20/2021 05:31 PM   Modules accepted: Orders

## 2021-01-20 NOTE — Telephone Encounter (Signed)
Patient left voicemail requesting that 30 tablets of sildenafil 50 mg (or 15 tablets of 100mg ) be sent to [facility] in [place]. Will route to provider.   [place], RN

## 2021-01-20 NOTE — Telephone Encounter (Signed)
Medication refilled and sent to [facility] in [place] per request.

## 2021-01-21 LAB — HIV-1 RNA QUANT-NO REFLEX-BLD
HIV 1 RNA Quant: 49 Copies/mL — ABNORMAL HIGH
HIV-1 RNA Quant, Log: 1.69 Log cps/mL — ABNORMAL HIGH

## 2021-01-29 ENCOUNTER — Other Ambulatory Visit: Payer: Self-pay

## 2021-01-29 ENCOUNTER — Encounter: Payer: Self-pay | Admitting: Internal Medicine

## 2021-01-29 ENCOUNTER — Ambulatory Visit: Payer: Medicaid Other | Admitting: Internal Medicine

## 2021-01-29 VITALS — BP 94/56 | HR 77 | Temp 97.7°F | Resp 17 | Ht 73.0 in | Wt 193.0 lb

## 2021-01-29 DIAGNOSIS — I200 Unstable angina: Secondary | ICD-10-CM

## 2021-01-29 DIAGNOSIS — E039 Hypothyroidism, unspecified: Secondary | ICD-10-CM

## 2021-01-29 DIAGNOSIS — Z23 Encounter for immunization: Secondary | ICD-10-CM

## 2021-01-29 DIAGNOSIS — Z21 Asymptomatic human immunodeficiency virus [HIV] infection status: Secondary | ICD-10-CM

## 2021-01-29 DIAGNOSIS — I10 Essential (primary) hypertension: Secondary | ICD-10-CM

## 2021-01-29 DIAGNOSIS — I5022 Chronic systolic (congestive) heart failure: Secondary | ICD-10-CM

## 2021-01-29 DIAGNOSIS — I472 Ventricular tachycardia, unspecified: Secondary | ICD-10-CM

## 2021-01-29 DIAGNOSIS — E782 Mixed hyperlipidemia: Secondary | ICD-10-CM

## 2021-01-29 DIAGNOSIS — I214 Non-ST elevation (NSTEMI) myocardial infarction: Secondary | ICD-10-CM

## 2021-01-29 DIAGNOSIS — J432 Centrilobular emphysema: Secondary | ICD-10-CM | POA: Diagnosis not present

## 2021-01-29 DIAGNOSIS — Z6825 Body mass index (BMI) 25.0-25.9, adult: Secondary | ICD-10-CM

## 2021-01-29 DIAGNOSIS — E663 Overweight: Secondary | ICD-10-CM

## 2021-01-29 DIAGNOSIS — I25110 Atherosclerotic heart disease of native coronary artery with unstable angina pectoris: Secondary | ICD-10-CM

## 2021-01-29 DIAGNOSIS — F1910 Other psychoactive substance abuse, uncomplicated: Secondary | ICD-10-CM

## 2021-01-29 MED ORDER — AMIODARONE HCL 200 MG PO TABS: 200.0000 mg | ORAL_TABLET | Freq: Every day | ORAL | 1 refills | Status: DC

## 2021-01-29 MED ORDER — BUDESONIDE-FORMOTEROL FUMARATE 80-4.5 MCG/ACT IN AERO: 2.0000 | INHALATION_SPRAY | Freq: Two times a day (BID) | RESPIRATORY_TRACT | 5 refills | Status: AC

## 2021-01-29 MED ORDER — LEVOTHYROXINE SODIUM 175 MCG PO TABS: 175.0000 ug | ORAL_TABLET | Freq: Every day | ORAL | 1 refills | Status: DC

## 2021-01-29 NOTE — Assessment & Plan Note (Signed)
In remission.

## 2021-01-29 NOTE — Assessment & Plan Note (Signed)
Encourage diet and exercise for weight loss 

## 2021-01-29 NOTE — Patient Instructions (Signed)
Managing the Challenge of Quitting Smoking Quitting smoking is a physical and mental challenge. You will face cravings, withdrawal symptoms, and temptation. Before quitting, work with your health care provider to make a plan that can help you manage quitting. Preparation canhelp you quit and keep you from giving in. How to manage lifestyle changes Managing stress Stress can make you want to smoke, and wanting to smoke may cause stress. It is important to find ways to manage your stress. You might try some of the following: Practice relaxation techniques. Breathe slowly and deeply, in through your nose and out through your mouth. Listen to music. Soak in a bath or take a shower. Imagine a peaceful place or vacation. Get some support. Talk with family or friends about your stress. Join a support group. Talk with a counselor or therapist. Get some physical activity. Go for a walk, run, or bike ride. Play a favorite sport. Practice yoga.  Medicines Talk with your health care provider about medicines that might help you dealwith cravings and make quitting easier for you. Relationships Social situations can be difficult when you are quitting smoking. To manage this, you can: Avoid parties and other social situations where people might be smoking. Avoid alcohol. Leave right away if you have the urge to smoke. Explain to your family and friends that you are quitting smoking. Ask for support and let them know you might be a bit grumpy. Plan activities where smoking is not an option. General instructions Be aware that many people gain weight after they quit smoking. However, not everyone does. To keep from gaining weight, have a plan in place before you quit and stick to the plan after you quit. Your plan should include: Having healthy snacks. When you have a craving, it may help to: Eat popcorn, carrots, celery, or other cut vegetables. Chew sugar-free gum. Changing how you eat. Eat small  portion sizes at meals. Eat 4-6 small meals throughout the day instead of 1-2 large meals a day. Be mindful when you eat. Do not watch television or do other things that might distract you as you eat. Exercising regularly. Make time to exercise each day. If you do not have time for a long workout, do short bouts of exercise for 5-10 minutes several times a day. Do some form of strengthening exercise, such as weight lifting. Do some exercise that gets your heart beating and causes you to breathe deeply, such as walking fast, running, swimming, or biking. This is very important. Drinking plenty of water or other low-calorie or no-calorie drinks. Drink 6-8 glasses of water daily.  How to recognize withdrawal symptoms Your body and mind may experience discomfort as you try to get used to not having nicotine in your system. These effects are called withdrawal symptoms. They may include: Feeling hungrier than normal. Having trouble concentrating. Feeling irritable or restless. Having trouble sleeping. Feeling depressed. Craving a cigarette. To manage withdrawal symptoms: Avoid places, people, and activities that trigger your cravings. Remember why you want to quit. Get plenty of sleep. Avoid coffee and other caffeinated drinks. These may worsen some of your symptoms. These symptoms may surprise you. But be assured that they are normal to havewhen quitting smoking. How to manage cravings Come up with a plan for how to deal with your cravings. The plan should include the following: A definition of the specific situation you want to deal with. An alternative action you will take. A clear idea for how this action will help. The   name of someone who might help you with this. Cravings usually last for 5-10 minutes. Consider taking the following actions to help you with your plan to deal with cravings: Keep your mouth busy. Chew sugar-free gum. Suck on hard candies or a straw. Brush your  teeth. Keep your hands and body busy. Change to a different activity right away. Squeeze or play with a ball. Do an activity or a hobby, such as making bead jewelry, practicing needlepoint, or working with wood. Mix up your normal routine. Take a short exercise break. Go for a quick walk or run up and down stairs. Focus on doing something kind or helpful for someone else. Call a friend or family member to talk during a craving. Join a support group. Contact a quitline. Where to find support To get help or find a support group: Call the National Cancer Institute's Smoking Quitline: 1-800-QUIT NOW (784-8669) Visit the website of the Substance Abuse and Mental Health Services Administration: www.samhsa.gov Text QUIT to SmokefreeTXT: 478848 Where to find more information Visit these websites to find more information on quitting smoking: National Cancer Institute: www.smokefree.gov American Lung Association: www.lung.org American Cancer Society: www.cancer.org Centers for Disease Control and Prevention: www.cdc.gov American Heart Association: www.heart.org Contact a health care provider if: You want to change your plan for quitting. The medicines you are taking are not helping. Your eating feels out of control or you cannot sleep. Get help right away if: You feel depressed or become very anxious. Summary Quitting smoking is a physical and mental challenge. You will face cravings, withdrawal symptoms, and temptation to smoke again. Preparation can help you as you go through these challenges. Try different techniques to manage stress, handle social situations, and prevent weight gain. You can deal with cravings by keeping your mouth busy (such as by chewing gum), keeping your hands and body busy, calling family or friends, or contacting a quitline for people who want to quit smoking. You can deal with withdrawal symptoms by avoiding places where people smoke, getting plenty of rest, and  avoiding drinks with caffeine. This information is not intended to replace advice given to you by your health care provider. Make sure you discuss any questions you have with your healthcare provider. Document Revised: 01/15/2019 Document Reviewed: 01/15/2019 Elsevier Patient Education  2022 Elsevier Inc.  

## 2021-01-29 NOTE — Assessment & Plan Note (Signed)
He denies recent chest pain Continue Atorvastatin, Metoprolol and Aspirin

## 2021-01-29 NOTE — Assessment & Plan Note (Signed)
Encouraged him to consume alow fat diet Continue Atorvastatin 

## 2021-01-29 NOTE — Assessment & Plan Note (Signed)
Continue Atorvastatin, Metoprolol and Aspirin Encourage low-fat diet

## 2021-01-29 NOTE — Assessment & Plan Note (Signed)
Compensated Continue Losartan, Metoprolol and Spironolactone Monitor daily weights

## 2021-01-29 NOTE — Assessment & Plan Note (Signed)
Continue Amiodarone and Metoprolol Will monitor

## 2021-01-29 NOTE — Assessment & Plan Note (Signed)
Encourage smoking cessation Will start Symbicort

## 2021-01-29 NOTE — Assessment & Plan Note (Addendum)
Low today on Losartan, Metoprolol and Spironolactone He is not symptomatic Will monitor kidney function at next visit

## 2021-01-29 NOTE — Assessment & Plan Note (Signed)
TSH and free T4 today Continue Levothyroxine 

## 2021-01-29 NOTE — Assessment & Plan Note (Signed)
Continue Cabenuva prescribed by infectious disease

## 2021-01-29 NOTE — Assessment & Plan Note (Signed)
Encourage low-fat diet Continue Atorvastatin, Metoprolol and Aspirin

## 2021-01-29 NOTE — Progress Notes (Signed)
HPI  Pt presents to the clinic today to establish care and for management of the conditions listed below.  CHF: He denies chronic cough, shortness of breath or swelling in his legs. He is taking Losartan, Metoprolol and Sprionolactone as prescribed. Echo from 09/2020 reviewed.  HLD with CAD s/p MI: His last LDL was 43, triglycerides 130, 03/2020. He denies myalgias on Atorvastatin. He is taking Metoprolol and ASA as well.   HIV: His last viral load was 49, CD 4 count 668, 11/2020. Managed with Cabenuva. He follows with Infectious Disease.  Hypothyroidism: He denies any issues on his current dose of Levothyroxine. He does not follow with endocrinology.  HTN with Cardiomyopathy: His BP today is. He is taking Losartan, Metoprolol and Spironolactone as prescribed. ECG from 12/2020 reviewed.  Hx of Vtach: Managed with Amiodarone and Metoprolol. ECG from 12/2020 reviewed.  COPD: He has a chronic cough but denies shortness of breath. He does not follow with pulmonology. He does continue to smoke.  Past Medical History:  Diagnosis Date   Change in hearing, bilateral 14/43/1540   Chronic systolic congestive heart failure ([place])    a. 06/2019 Echo: EF 30-35%, glob HK. gr1 DD. Mod elev RVSP. Midly dil LA, Triv MR/AI. Asc Ao 2m; b. 09/2020 Echo: EF<20%, glob HK. Sev red RV fxn. RVSP 48.483mg. Mod dil LA. Sev dil RA. Mild MR.   Coronary artery disease    a. 03/2005 Ant STEMI s/p PCI/DES to the LAD; b. 05/2010 St echo: reportedly nl; c. 09/2020 Cath: LM nl, LAD 100p/m, D1 min irregs, LCX min irregs, RCA 20p. Dist LAD fills via collats from RPDA-->Med Rx.   [place] ([place]   a. cocaine/methamphetamines.   [facility] disease ([facility]   Hyperlipidemia LDL goal <70    Hypertension 06/28/2018   Hypothyroidism 01/07/2019   Ischemic cardiomyopathy    a.  06/2019 Echo: EF 30-35%; 09/2020 Echo: EF<20%, glob HK.   Lateral epicondylitis 01/07/2019   Noncompliance    STEMI (ST elevation myocardial infarction)  ([facility]   Tobacco abuse    Wide-complex tachycardia ([facility]   a. 10/2020 - SVT w/ aberrancy vs VT-->s/p DCCV.    Current Outpatient Medications  Medication Sig Dispense Refill   amiodarone (PACERONE) 200 MG tablet Take 1 tablet (200 mg total) by mouth daily. 90 tablet 3   aspirin 81 MG EC tablet Take 1 tablet (81 mg total) by mouth daily. Swallow whole. 90 tablet 3   atorvastatin (LIPITOR) 20 MG tablet Take 1 tablet (20 mg total) by mouth daily. 90 tablet 3   cabotegravir & rilpivirine ER (CABENUVA) 600 & 900 MG/3ML injection Inject 1 kit into the muscle every 2 (two) months. 6 mL 5   Cholecalciferol (VITAMIN D-3) 125 MCG (5000 UT) TABS Take 5,000 Units by mouth daily.     cyclobenzaprine (FLEXERIL) 10 MG tablet Take 10 mg by mouth 3 (three) times daily as needed.     levothyroxine (SYNTHROID) 175 MCG tablet TAKE 1 TABLET BY MOUTH ONCE DAILY BEFORE BREAKFAST 30 tablet 0   losartan (COZAAR) 25 MG tablet Take 1 tablet (25 mg total) by mouth daily. 90 tablet 3   methocarbamol (ROBAXIN) 500 MG tablet Take 1 tablet (500 mg total) by mouth every 6 (six) hours as needed for muscle spasms. 30 tablet 0   metoprolol succinate (TOPROL XL) 25 MG 24 hr tablet Take 0.5 tablets (12.5 mg total) by mouth daily. (Patient not taking: No sig reported) 30 tablet 5  polyethylene glycol (MIRALAX / GLYCOLAX) 17 g packet Take 17 g by mouth daily as needed for moderate constipation. 14 each 0   predniSONE (DELTASONE) 10 MG tablet Take 1 tablet (10 mg total) by mouth daily with breakfast. 30 tablet 0   sildenafil (VIAGRA) 50 MG tablet Take 1 tablet (50 mg total) by mouth daily as needed for erectile dysfunction. 30 tablet 0   spironolactone (ALDACTONE) 25 MG tablet Take 0.5 tablets (12.5 mg total) by mouth daily. 45 tablet 3   No current facility-administered medications for this visit.    Allergies  Allergen Reactions   Plavix [Clopidogrel Bisulfate] Rash    Family History  Problem Relation Age of Onset    Diabetes Mellitus II Father     Social History   Socioeconomic History   Marital status: Divorced    Spouse name: Not on file   Number of children: Not on file   Years of education: Not on file   Highest education level: Not on file  Occupational History   Not on file  Tobacco Use   Smoking status: Every Day    Packs/day: 1.00    Years: 42.00    Pack years: 42.00    Types: Cigarettes    Start date: 01/07/1968   Smokeless tobacco: Never   Tobacco comments:    Smoked 1 ppd for most of his adult life but now smoking 2 cigarettes daily  Vaping Use   Vaping Use: Never used  Substance and Sexual Activity   Alcohol use: Yes    Alcohol/week: 1.0 standard drink    Types: 1 Cans of beer per week    Comment: 1 can/beer/daily   Drug use: Not Currently    Types: Methamphetamines    Comment: smokes or snorts crystal meth 2-3x/wk.   Sexual activity: Not Currently    Partners: Female    Birth control/protection: Condom    Comment: given condoms  Other Topics Concern   Not on file  Social History Narrative   Lives in [place] by himself.  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   Social Determinants of Health   Financial Resource Strain: Not on file  Food Insecurity: Not on file  Transportation Needs: Not on file  Physical Activity: Not on file  Stress: Not on file  Social Connections: Not on file  Intimate Partner Violence: Not on file    ROS:  Constitutional: Denies fever, malaise, fatigue, headache or abrupt weight changes.  HEENT: Denies eye pain, eye redness, ear pain, ringing in the ears, wax buildup, runny nose, nasal congestion, bloody nose, or sore throat. Respiratory: Patient reports chronic cough.  Denies difficulty breathing, shortness of breath,  or sputum production.   Cardiovascular: Denies chest pain, chest tightness, palpitations or swelling in the hands or feet.  Gastrointestinal: Denies abdominal pain, bloating, constipation, diarrhea or blood in the  stool.  GU: Denies frequency, urgency, pain with urination, blood in urine, odor or discharge. Musculoskeletal: Denies decrease in range of motion, difficulty with gait, muscle pain or joint pain and swelling.  Skin: Denies redness, rashes, lesions or ulcercations.  Neurological: Patient reports numbness in feet.  Denies dizziness, difficulty with memory, difficulty with speech or problems with balance and coordination.  Psych: Denies anxiety, depression, SI/HI.  No other specific complaints in a complete review of systems (except as listed in HPI above).  PE: BP (!) 94/56 (BP Location: Right Arm, Patient Position: Sitting, Cuff Size: Normal)   Pulse 77  Temp 97.7 F (36.5 C) (Temporal)   Resp 17   Ht _0  (1.854 m)   Wt 193 lb (87.5 kg)   SpO2 97%   BMI 25.46 kg/m   Wt Readings from Last 3 Encounters:  12/30/20 188 lb 4 oz (85.4 kg)  12/10/20 189 lb 4 oz (85.8 kg)  11/16/20 193 lb (87.5 kg)    General: Appears his stated age, well developed, well nourished in NAD. HEENT: Head: normal shape and size; Eyes: EOMs intact; Neck: Neck supple, trachea midline. No masses, lumps or thyromegaly present.  Cardiovascular: Normal rate and rhythm. S1,S2 noted.  No murmur, rubs or gallops noted. No JVD or BLE edema. No carotid bruits noted. Pulmonary/Chest: Normal effort and positive vesicular breath sounds. No respiratory distress. No wheezes, rales or ronchi noted.  Musculoskeletal: No difficulty with gait.  Neurological: Alert and oriented.  Psychiatric: Mood and affect normal. Behavior is normal. Judgment and thought content normal.    BMET    Component Value Date/Time   NA 139 01/05/2021 0431   NA 139 12/10/2020 1442   K 3.7 01/05/2021 0431   CL 106 01/05/2021 0431   CO2 25 01/05/2021 0431   GLUCOSE 110 (H) 01/05/2021 0431   BUN 28 (H) 01/05/2021 0431   BUN 28 (H) 12/10/2020 1442   CREATININE 1.20 01/05/2021 0431   CREATININE 1.06 11/16/2020 1137   CALCIUM 9.0 01/05/2021  0431   GFRNONAA >60 01/05/2021 0431   GFRNONAA 77 03/18/2020 1457   GFRAA 90 03/18/2020 1457    Lipid Panel     Component Value Date/Time   CHOL 106 03/18/2020 1457   CHOL 196 06/12/2019 1053   TRIG 130 03/18/2020 1457   HDL 42 03/18/2020 1457   HDL 57 06/12/2019 1053   CHOLHDL 2.5 03/18/2020 1457   LDLCALC 43 03/18/2020 1457    CBC    Component Value Date/Time   WBC 6.4 01/05/2021 0431   RBC 4.40 01/05/2021 0431   HGB 14.2 01/05/2021 0431   HGB 15.3 06/12/2019 1053   HCT 40.5 01/05/2021 0431   HCT 43.2 06/12/2019 1053   PLT 238 01/05/2021 0431   PLT 346 06/12/2019 1053   MCV 92.0 01/05/2021 0431   MCV 90 06/12/2019 1053   MCH 32.3 01/05/2021 0431   MCHC 35.1 01/05/2021 0431   RDW 13.7 01/05/2021 0431   RDW 12.4 06/12/2019 1053   LYMPHSABS 2.2 01/05/2021 0431   MONOABS 0.5 01/05/2021 0431   EOSABS 0.1 01/05/2021 0431   BASOSABS 0.1 01/05/2021 0431    Hgb A1C No results found for: HGBA1C   Assessment and Plan:    Webb Silversmith, NP This visit occurred during the SARS-CoV-2 public health emergency.  Safety protocols were in place, including screening questions prior to the visit, additional usage of staff PPE, and extensive cleaning of exam room while observing appropriate contact time as indicated for disinfecting solutions.

## 2021-02-01 ENCOUNTER — Institutional Professional Consult (permissible substitution): Payer: Medicaid Other | Admitting: Internal Medicine

## 2021-02-01 DIAGNOSIS — R000 Tachycardia, unspecified: Secondary | ICD-10-CM

## 2021-02-02 NOTE — Telephone Encounter (Signed)
Received forms from [employer] and reviewed with provider. Necessary form completed and faxed to number provided with instructions to call [facility] if any further questions. Placed signed forms in my Faxed folder.

## 2021-02-09 ENCOUNTER — Institutional Professional Consult (permissible substitution): Payer: Medicaid Other | Admitting: Internal Medicine

## 2021-02-16 ENCOUNTER — Ambulatory Visit: Payer: Medicaid Other | Admitting: Nurse Practitioner

## 2021-02-26 ENCOUNTER — Other Ambulatory Visit: Payer: Self-pay

## 2021-02-26 NOTE — Telephone Encounter (Signed)
Patient requesting refill on Viagra. Please advise.   Jodelle Fausto Lesli Albee, CMA

## 2021-03-01 ENCOUNTER — Ambulatory Visit: Payer: Medicaid Other | Admitting: Family

## 2021-03-01 ENCOUNTER — Telehealth: Payer: Self-pay | Admitting: Family

## 2021-03-01 NOTE — Telephone Encounter (Signed)
Patient did not show for his Heart Failure Clinic appointment on 03/01/21. Will attempt to reschedule.

## 2021-03-02 ENCOUNTER — Telehealth: Payer: Self-pay | Admitting: Family

## 2021-03-02 NOTE — Telephone Encounter (Signed)
LVM in attempt to reschedule no show appointment for the CHF Clinic from 11/21     [place], [place]

## 2021-03-03 ENCOUNTER — Other Ambulatory Visit (HOSPITAL_COMMUNITY): Payer: Self-pay

## 2021-03-08 ENCOUNTER — Telehealth: Payer: Self-pay

## 2021-03-08 ENCOUNTER — Telehealth: Payer: Self-pay | Admitting: Physician Assistant

## 2021-03-08 NOTE — Telephone Encounter (Signed)
Douglas Conway with [employer] to review order forms we received. She provided another name and number to discuss these forms in more detail and what specifically is needed. Will await her return call.

## 2021-03-08 NOTE — Telephone Encounter (Signed)
Spoke with [place] regarding forms received via Fax from [employer]. Previous forms were not complete and therefore we are receiving repeat forms with needed documentation. We discussed what is required and documentation. She will be sending additional forms with added information for provider review and signature.   Documents: [place] [facility] Request for Prior Approval CMN/PA  Treatment plan form  Will review necessary documentation and forms with provider on 03/11/21

## 2021-03-08 NOTE — Telephone Encounter (Signed)
Left voicemail message to call back for review of information with his [employer]vest.

## 2021-03-08 NOTE — Telephone Encounter (Signed)
[facility] Patient Advocate Encounter  Patient's medication Renaldo Harrison) have been couriered to [facility] from [employer] and will be administered on patient next office visit on 03/18/21.  Clearance Coots , CPhT Specialty Pharmacy Patient [facility] for Infectious Disease Phone: [phone] Fax:  [phone]

## 2021-03-09 NOTE — Telephone Encounter (Signed)
Spoke with Toni Amend and updated that I was able to reach patient regarding his life vest. She is going to reach out to get a download for paperwork to be completed. She will get that information sent over to [place] and will review with provider when I return on Thursday.

## 2021-03-09 NOTE — Telephone Encounter (Signed)
Spoke with patient to inquire if he was wearing his lifevest. He states that since starting the amiodarone he has not worn it for the last 3-4 weeks. He reports having the life vest with him but not wearing. He states that he has it in case he needs it. Advised that he may receive call from [employer] team to verify information with the vest and to please answer if they should call him. He verbalized understanding with no further questions at this time.   Reached out to [place] with [employer] to update on patient response.

## 2021-03-17 ENCOUNTER — Ambulatory Visit: Payer: Medicaid Other | Admitting: Nurse Practitioner

## 2021-03-17 NOTE — Progress Notes (Deleted)
Office Visit    Patient Name: Douglas Conway Date of Encounter: 03/17/2021  Primary Care Provider:  Jearld Fenton, NP Primary Cardiologist:  Douglas Rogue, MD  Chief Complaint    65 year old male with a history of CAD, ischemic cardiomyopathy, HFrEF, HIV, hypothyroidism, hypertension, hyperlipidemia, tobacco abuse, noncompliance, and wide-complex tachycardia, presents for follow-up***  Past Medical History    Past Medical History:  Diagnosis Date   Change in hearing, bilateral 24/40/1027   Chronic systolic congestive heart failure ([place])    a. 06/2019 Echo: EF 30-35%, glob HK. gr1 DD. Mod elev RVSP. Midly dil LA, Triv MR/AI. Asc Ao 19m; b. 09/2020 Echo: EF<20%, glob HK. Sev red RV fxn. RVSP 48.419mg. Mod dil LA. Sev dil RA. Mild MR.   Coronary artery disease    a. 03/2005 Ant STEMI s/p PCI/DES to the LAD; b. 05/2010 St echo: reportedly nl; c. 09/2020 Cath: LM nl, LAD 100p/m, D1 min irregs, LCX min irregs, RCA 20p. Dist LAD fills via collats from RPDA-->Med Rx.   Drug abuse ([place]   a. cocaine/methamphetamines.   HIV disease ([place]/19/2020   Hyperlipidemia LDL goal <70    Hypertension 06/28/2018   Hypothyroidism 01/07/2019   Ischemic cardiomyopathy    a.  06/2019 Echo: EF 30-35%; 09/2020 Echo: EF<20%, glob HK.   Lateral epicondylitis 01/07/2019   Noncompliance    [place]) [place] [place]   Tobacco abuse    Wide-complex tachycardia    a. 10/2020 - SVT w/ aberrancy vs VT-->s/p DCCV.   Past Surgical History:  Procedure Laterality Date   CORONARY ANGIOPLASTY WITH STENT PLACEMENT     LEFT HEART CATH AND CORONARY ANGIOGRAPHY N/A 10/08/2020   Procedure: LEFT HEART CATH AND CORONARY ANGIOGRAPHY;  Surgeon: Douglas HampshireMD;  [facility];  Service: Cardiovascular;  Laterality: N/A;    Allergies  Allergies  Allergen Reactions   Plavix [Clopidogrel Bisulfate] Rash    History of Present Illness    [age]ear old male with above complex  past medical history including CAD, ischemic cardiomyopathy, HFrEF, hypertension, hyperlipidemia, HIV, hypothyroidism, tobacco abuse, wide-complex tachycardia, and noncompliance.  Cardiac history dates back to 2006, when he suffered an anterior STEMI and underwent PCI and drug-eluting stent placement to the LAD.  He was found to have LV dysfunction at that time (25%), and was initiated on medical therapy.  EF remained depressed and patient was advised that he needed an ICD, but declined.  He was lost [facility] follow-up [facility]  Echo in March 2021 showed an EF of 30 to 35% with global hypokinesis.  Toprol-XL and Entresto were added in June 2022 however, he did not fill those prescriptions.  In late June 2022, he was hospitalized with sudden onset dyspnea, diaphoresis, and lightheadedness with possible syncope.  He was found to be in a wide-complex tachycardia at 180 to 190 bpm.  He underwent direct-current cardioversion at 100 J with restoration of sinus rhythm.  He was then placed on amiodarone [facility] in for non-STEMI.  He was found to have a markedly elevated TSH of 65.394.  Echocardiogram showed an EF of less than 20% with global hypokinesis.  Diagnostic catheterization revealed a total occlusion of the proximal/mid LAD at the site of the prior stent, with right to left collaterals.  He otherwise had minor irregularities/nonobstructive disease.  Patient left AMA following catheterization but presented back to  the emergency department on October 13, 2020, with chest pain, diaphoresis, and near syncope.  He had used both methamphetamines and cocaine.  He was hypertensive with a wide-complex tachycardia at 197 bpm.  He required cardioversion at 120 J x 1 and was placed on IV amiodarone, and admitted for further evaluation.  During admission, he was relatively hypotensive, limiting GDMT.  LifeVest was placed at  discharge, as he was not felt to be an ICD candidate in the setting of recent drug use.    Patient transition care to our [place] office on September 1.  At that time, he noted he was not wearing his LifeVest.  He continued to smoke cigarettes and also reported crystal meth use earlier in the week.  He did have 1 presyncopal episode prior to that visit and reported compliance with amiodarone.  Unfortunately, he was readmitted in late September secondary to recurrent wide-complex tachycardia with chest pain and palpitations.  He did have his LifeVest on at the time and received multiple alarms indicating that shock would be given but he was able to abort the shocks and ultimately called EMS.  He was successfully cardioverted with sedation by EMS.  He again reported amphetamine and cocaine use.  He was placed on low-dose metoprolol, losartan, spironolactone, and intravenous amiodarone with recommendation for inpatient EP evaluation however, Douglas Conway left AMA.  Home Medications    Current Outpatient Medications  Medication Sig Dispense Refill   amiodarone (PACERONE) 200 MG tablet Take 1 tablet (200 mg total) by mouth daily. 90 tablet 1   aspirin 81 MG EC tablet Take 1 tablet (81 mg total) by mouth daily. Swallow whole. 90 tablet 3   atorvastatin (LIPITOR) 20 MG tablet Take 1 tablet (20 mg total) by mouth daily. 90 tablet 3   budesonide-formoterol (SYMBICORT) 80-4.5 MCG/ACT inhaler Inhale 2 puffs into the lungs 2 (two) times daily. 1 each 5   cabotegravir & rilpivirine ER (CABENUVA) 600 & 900 MG/3ML injection Inject 1 kit into the muscle every 2 (two) months. 6 mL 5   Cholecalciferol (VITAMIN D-3) 125 MCG (5000 UT) TABS Take 5,000 Units by mouth daily.     levothyroxine (SYNTHROID) 175 MCG tablet Take 1 tablet (175 mcg total) by mouth daily before breakfast. 90 tablet 1   losartan (COZAAR) 25 MG tablet Take 1 tablet (25 mg total) by mouth daily. 90 tablet 3   metoprolol succinate (TOPROL XL) 25 MG 24  hr tablet Take 0.5 tablets (12.5 mg total) by mouth daily. 30 tablet 5   sildenafil (VIAGRA) 50 MG tablet Take 1 tablet (50 mg total) by mouth daily as needed for erectile dysfunction. 30 tablet 0   spironolactone (ALDACTONE) 25 MG tablet Take 0.5 tablets (12.5 mg total) by mouth daily. 45 tablet 3   No current facility-administered medications for this visit.     Review of Systems    ***.  All other systems reviewed and are otherwise negative except as noted above.    Physical Exam    VS:  There were no vitals taken for this visit. , BMI There is no height or weight on file to calculate BMI.     GEN: Well nourished, well developed, in no acute distress. HEENT: normal. Neck: Supple, no JVD, carotid bruits, or masses. Cardiac: RRR, no murmurs, rubs, or gallops. No clubbing, cyanosis, edema.  Radials/DP/PT 2+ and equal bilaterally.  Respiratory:  Respirations regular and unlabored, clear to auscultation bilaterally. GI: Soft, nontender, nondistended, BS + x 4. MS:  no deformity or atrophy. Skin: warm and dry, no rash. Neuro:  Strength and sensation are intact. Psych: Normal affect.  Accessory Clinical Findings    ECG personally reviewed by me today - *** - no acute changes.  Lab Results  Component Value Date   WBC 6.4 01/05/2021   HGB 14.2 01/05/2021   HCT 40.5 01/05/2021   MCV 92.0 01/05/2021   PLT 238 01/05/2021   Lab Results  Component Value Date   CREATININE 1.20 01/05/2021   BUN 28 (H) 01/05/2021   NA 139 01/05/2021   K 3.7 01/05/2021   CL 106 01/05/2021   CO2 25 01/05/2021   Lab Results  Component Value Date   ALT 32 01/05/2021   AST 45 (H) 01/05/2021   ALKPHOS 54 01/05/2021   BILITOT 0.8 01/05/2021   Lab Results  Component Value Date   CHOL 106 03/18/2020   HDL 42 03/18/2020   LDLCALC 43 03/18/2020   TRIG 130 03/18/2020   CHOLHDL 2.5 03/18/2020    No results found for: HGBA1C  Assessment & Plan    1.  ***   Murray Hodgkins, NP 03/17/2021,  10:30 AM

## 2021-03-18 ENCOUNTER — Ambulatory Visit (INDEPENDENT_AMBULATORY_CARE_PROVIDER_SITE_OTHER): Payer: Medicaid Other | Admitting: Family

## 2021-03-18 ENCOUNTER — Ambulatory Visit (INDEPENDENT_AMBULATORY_CARE_PROVIDER_SITE_OTHER): Payer: Medicaid Other

## 2021-03-18 ENCOUNTER — Encounter: Payer: Medicaid Other | Admitting: Family

## 2021-03-18 ENCOUNTER — Other Ambulatory Visit: Payer: Self-pay

## 2021-03-18 ENCOUNTER — Encounter: Payer: Self-pay | Admitting: Family

## 2021-03-18 VITALS — BP 145/83 | HR 69 | Temp 97.5°F | Wt 197.2 lb

## 2021-03-18 DIAGNOSIS — N521 Erectile dysfunction due to diseases classified elsewhere: Secondary | ICD-10-CM

## 2021-03-18 DIAGNOSIS — Z21 Asymptomatic human immunodeficiency virus [HIV] infection status: Secondary | ICD-10-CM | POA: Diagnosis present

## 2021-03-18 DIAGNOSIS — Z23 Encounter for immunization: Secondary | ICD-10-CM

## 2021-03-18 DIAGNOSIS — Z113 Encounter for screening for infections with a predominantly sexual mode of transmission: Secondary | ICD-10-CM | POA: Diagnosis not present

## 2021-03-18 DIAGNOSIS — Z79899 Other long term (current) drug therapy: Secondary | ICD-10-CM | POA: Diagnosis not present

## 2021-03-18 DIAGNOSIS — Z0000 Encounter for general adult medical examination without abnormal findings: Secondary | ICD-10-CM | POA: Diagnosis not present

## 2021-03-18 MED ORDER — CABOTEGRAVIR & RILPIVIRINE ER 600 & 900 MG/3ML IM SUER
1.0000 | Freq: Once | INTRAMUSCULAR | Status: AC
  Administered 2021-03-18: 1 via INTRAMUSCULAR

## 2021-03-18 MED ORDER — SILDENAFIL CITRATE 50 MG PO TABS: 50.0000 mg | ORAL_TABLET | Freq: Every day | ORAL | 3 refills | Status: DC | PRN

## 2021-03-18 NOTE — Progress Notes (Signed)
   Covid-19 Vaccination Clinic  Name:  Douglas Conway    MRN: [id number] DOB: 07-20-1955  03/18/2021  [name] was observed post Covid-19 immunization for 15 minutes without incident. He was provided with Vaccine Information Sheet and instruction to access the V-Safe system.   [name] was instructed to call 911 with any severe reactions post vaccine: Difficulty breathing  Swelling of face and throat  A fast heartbeat  A bad rash all over body  Dizziness and weakness   Immunizations Administered     Name Date Dose VIS Date Route   Pfizer Covid-19 Vaccine Bivalent Booster 03/18/2021  2:24 PM 0.3 mL 12/09/2020 Intramuscular   Manufacturer: [employer], [employer]   Lot: [id number]   NDC: [address]-3      Harriet Pho

## 2021-03-18 NOTE — Assessment & Plan Note (Signed)
·   Discussed importance of safe sexual practices and condom use.  Condoms offered. °· Bivalent COVID booster updated. °

## 2021-03-18 NOTE — Progress Notes (Signed)
Brief Narrative   Patient ID: Douglas Conway, male    DOB: 05/07/1955, 65 y.o.   MRN: [id number]  Douglas Conway is a 65 y/o caucasian male with HIV disease with risk factor of MSM. Initial clinic entry lab work was 31,800 and CD4 count of 800 while he was incarcerated in March 2020. Genotype was without significant resistance patterns. ART regimen was Biktarvy.   Subjective:    Chief Complaint  Patient presents with   Follow-up    B20    HPI:  Douglas Conway is a 65 y.o. male with HIV disease last seen on 01/19/21 by Alfonse Spruce PharmD, CPP for every 2 month injection of Cabenuva. Viral load was undetectable and CD4 count of 668 .Here today for follow up and next injection of Cabenuva.   Douglas Conway continues to do well following his most recent injection of Cabenuva with minimal soreness.  Feeling well today with no new concerns/complaints. Denies fevers, chills, night sweats, headaches, changes in vision, neck pain/stiffness, nausea, diarrhea, vomiting, lesions or rashes.  Douglas Conway denies any feelings of being down, depressed, or hopeless recently.  No current recreational or illicit drug use, with approximately 1/2 pack of cigarettes per day and occasional alcohol.  Condoms offered.  Healthcare maintenance due includes COVID booster.   Allergies  Allergen Reactions   Plavix [Clopidogrel Bisulfate] Rash      Outpatient Medications Prior to Visit  Medication Sig Dispense Refill   amiodarone (PACERONE) 200 MG tablet Take 1 tablet (200 mg total) by mouth daily. 90 tablet 1   aspirin 81 MG EC tablet Take 1 tablet (81 mg total) by mouth daily. Swallow whole. 90 tablet 3   atorvastatin (LIPITOR) 20 MG tablet Take 1 tablet (20 mg total) by mouth daily. 90 tablet 3   budesonide-formoterol (SYMBICORT) 80-4.5 MCG/ACT inhaler Inhale 2 puffs into the lungs 2 (two) times daily. 1 each 5   cabotegravir & rilpivirine ER (CABENUVA) 600 & 900 MG/3ML injection Inject 1 kit into the muscle every 2 (two)  months. 6 mL 5   Cholecalciferol (VITAMIN D-3) 125 MCG (5000 UT) TABS Take 5,000 Units by mouth daily.     levothyroxine (SYNTHROID) 175 MCG tablet Take 1 tablet (175 mcg total) by mouth daily before breakfast. 90 tablet 1   losartan (COZAAR) 25 MG tablet Take 1 tablet (25 mg total) by mouth daily. 90 tablet 3   metoprolol succinate (TOPROL XL) 25 MG 24 hr tablet Take 0.5 tablets (12.5 mg total) by mouth daily. 30 tablet 5   sildenafil (VIAGRA) 50 MG tablet Take 1 tablet (50 mg total) by mouth daily as needed for erectile dysfunction. 30 tablet 0   spironolactone (ALDACTONE) 25 MG tablet Take 0.5 tablets (12.5 mg total) by mouth daily. 45 tablet 3   No facility-administered medications prior to visit.     Past Medical History:  Diagnosis Date   Change in hearing, bilateral 54/62/7035   Chronic systolic congestive Conway failure ([place])    a. 06/2019 Echo: EF 30-35%, glob HK. gr1 DD. Mod elev RVSP. Midly dil LA, Triv MR/AI. Asc Ao 60m; b. 09/2020 Echo: EF<20%, glob HK. Sev red RV fxn. RVSP 48.493mg. Mod dil LA. Sev dil RA. Mild MR.   Coronary artery disease    a. 03/2005 Ant STEMI s/p PCI/DES to the LAD; b. 05/2010 St echo: reportedly nl; c. 09/2020 Cath: LM nl, LAD 100p/m, D1 min irregs, LCX min irregs, RCA 20p. Dist LAD fills via collats from RPDA-->Med Rx.  Drug abuse ([place])    a. cocaine/methamphetamines.   HIV disease ([place]) 06/28/2018   Hyperlipidemia LDL goal <70    Hypertension 06/28/2018   Hypothyroidism 01/07/2019   Ischemic cardiomyopathy    a.  06/2019 Echo: EF 30-35%; 09/2020 Echo: EF<20%, glob HK.   Lateral epicondylitis 01/07/2019   Noncompliance    STEMI (ST elevation myocardial infarction) ([place])    Tobacco abuse    Wide-complex tachycardia    a. 10/2020 - SVT w/ aberrancy vs VT-->s/p DCCV.     Past Surgical History:  Procedure Laterality Date   CORONARY ANGIOPLASTY WITH STENT PLACEMENT     LEFT Conway CATH AND CORONARY ANGIOGRAPHY N/A 10/08/2020   Procedure: LEFT Conway  CATH AND CORONARY ANGIOGRAPHY;  Surgeon: Wellington Hampshire, MD;  Location: [facility];  Service: Cardiovascular;  Laterality: N/A;      Review of Systems  Constitutional:  Negative for appetite change, chills, fatigue, fever and unexpected weight change.  Eyes:  Negative for visual disturbance.  Respiratory:  Negative for cough, chest tightness, shortness of breath and wheezing.   Cardiovascular:  Negative for chest pain and leg swelling.  Gastrointestinal:  Negative for abdominal pain, constipation, diarrhea, nausea and vomiting.  Genitourinary:  Negative for dysuria, flank pain, frequency, genital sores, hematuria and urgency.  Skin:  Negative for rash.  Allergic/Immunologic: Negative for immunocompromised state.  Neurological:  Negative for dizziness and headaches.     Objective:    BP (!) 145/83 (BP Location: Left Arm)   Pulse 69   Temp (!) 97.5 F (36.4 C) (Oral)   Wt 197 lb 3.2 oz (89.4 kg)   BMI 26.02 kg/m  Nursing note and vital signs reviewed.  Physical Exam Constitutional:      General: He is not in acute distress.    Appearance: He is well-developed.  Eyes:     Conjunctiva/sclera: Conjunctivae normal.  Cardiovascular:     Rate and Rhythm: Normal rate and regular rhythm.     Conway sounds: Normal Conway sounds. No murmur heard.   No friction rub. No gallop.  Pulmonary:     Effort: Pulmonary effort is normal. No respiratory distress.     Breath sounds: Normal breath sounds. No wheezing or rales.  Chest:     Chest wall: No tenderness.  Abdominal:     General: Bowel sounds are normal.     Palpations: Abdomen is soft.     Tenderness: There is no abdominal tenderness.  Musculoskeletal:     Cervical back: Neck supple.  Lymphadenopathy:     Cervical: No cervical adenopathy.  Skin:    General: Skin is warm and dry.     Findings: No rash.  Neurological:     Mental Status: He is alert and oriented to person, place, and time.  Psychiatric:         Behavior: Behavior normal.        Thought Content: Thought content normal.        Judgment: Judgment normal.     Depression screen [facility] 2/9 03/18/2021 01/29/2021 12/30/2020 09/14/2020 07/14/2020  Decreased Interest 0 0 0 0 0  Down, Depressed, Hopeless 0 0 0 0 0  PHQ - 2 Score 0 0 0 0 0  Altered sleeping - 0 - - -  Tired, decreased energy - 0 - - -  Change in appetite - 0 - - -  Feeling bad or failure about yourself  - 0 - - -  Trouble concentrating - 0 - - -  Moving slowly or fidgety/restless - 0 - - -  Suicidal thoughts - 0 - - -  PHQ-9 Score - 0 - - -  Difficult doing work/chores - Not difficult at all - - -       Assessment & Plan:    Patient Active Problem List   Diagnosis Date Noted   Centrilobular emphysema ([place]) 01/29/2021   Overweight with body mass index (BMI) of 25 to 25.9 in adult 01/29/2021   Coronary artery disease    Polysubstance abuse ([place])    Erectile dysfunction 11/16/2020   NSTEMI (non-ST elevated myocardial infarction) ([place])    Hyperlipidemia    Unstable angina ([facility])    Ventricular tachyarrhythmia 00/92/3300   Chronic systolic CHF (congestive Conway failure) ([place]) 05/14/2019   Hypothyroidism 01/07/2019   HIV infection ([place]) 06/28/2018   Hypertension 06/28/2018   Healthcare maintenance 06/28/2018     Problem List Items Addressed This Visit       Other   HIV infection ([place]) - Primary    Douglas Conway continues to have well-controlled virus with good adherence and tolerance to [place].  No signs/symptoms of opportunistic infection.  We reviewed previous lab work and discussed plan of care.  Continue current dose of Cabenuva.  Injection provided without complication.  Check lab work today.  Plan for follow-up in 2 months or sooner if needed with lab work on the same day.      Relevant Orders   HIV-1 RNA quant-no reflex-bld   T-helper cell (CD4)- ([facility] clinic only)   COMPLETE METABOLIC PANEL WITH GFR   Healthcare maintenance    Discussed importance of safe  sexual practices and condom use.  Condoms offered. Bivalent COVID booster updated      Erectile dysfunction    Douglas Conway continues to use sildenafil as needed with no adverse side effects or priapism.  Reviewed importance of not taking medication with nitrates for chest pain and seeking care if erection lasts longer than 4 hours.  Continue current dose of sildenafil.      Other Visit Diagnoses     Pharmacologic therapy       Relevant Orders   Lipid panel   Screening for STDs (sexually transmitted diseases)       Relevant Orders   RPR        I am having Douglas Conway maintain his Vitamin D-3, losartan, Cabenuva, aspirin, atorvastatin, spironolactone, metoprolol succinate, budesonide-formoterol, amiodarone, levothyroxine, and sildenafil. We administered cabotegravir & rilpivirine ER.   Meds ordered this encounter  Medications   sildenafil (VIAGRA) 50 MG tablet    Sig: Take 1 tablet (50 mg total) by mouth daily as needed for erectile dysfunction.    Dispense:  30 tablet    Refill:  3    Order Specific Question:   Supervising Provider    Answer:   Baxter Flattery, CYNTHIA [[id number]]   cabotegravir & rilpivirine ER (CABENUVA) 600 & 900 MG/3ML injection 1 kit     Follow-up: Return in about 2 months (around 05/19/2021), or if symptoms worsen or fail to improve.   Terri Piedra, MSN, FNP-C Nurse Practitioner [facility] for Infectious Disease [place] number: [phone]

## 2021-03-18 NOTE — Patient Instructions (Signed)
Nice to see you.  We will check your lab work today.  Refills have been sent to the pharmacy.  Plan for follow up in 2 months or sooner if needed with lab work on the same day.  Have a great day and stay safe!   Happy Holidays!

## 2021-03-18 NOTE — Assessment & Plan Note (Signed)
Douglas Conway continues to have well-controlled virus with good adherence and tolerance to [place].  No signs/symptoms of opportunistic infection.  We reviewed previous lab work and discussed plan of care.  Continue current dose of Cabenuva.  Injection provided without complication.  Check lab work today.  Plan for follow-up in 2 months or sooner if needed with lab work on the same day.

## 2021-03-18 NOTE — Assessment & Plan Note (Signed)
Douglas Conway continues to use sildenafil as needed with no adverse side effects or priapism.  Reviewed importance of not taking medication with nitrates for chest pain and seeking care if erection lasts longer than 4 hours.  Continue current dose of sildenafil.

## 2021-03-19 LAB — T-HELPER CELL (CD4) - (RCID CLINIC ONLY)
CD4 % Helper T Cell: 38 % (ref 33–65)
CD4 T Cell Abs: 683 /uL (ref 400–1790)

## 2021-03-21 LAB — COMPLETE METABOLIC PANEL WITH GFR
AG Ratio: 1.5 (calc) (ref 1.0–2.5)
ALT: 14 U/L (ref 9–46)
AST: 19 U/L (ref 10–35)
Albumin: 4.4 g/dL (ref 3.6–5.1)
Alkaline phosphatase (APISO): 58 U/L (ref 35–144)
BUN: 22 mg/dL (ref 7–25)
CO2: 25 mmol/L (ref 20–32)
Calcium: 9.5 mg/dL (ref 8.6–10.3)
Chloride: 105 mmol/L (ref 98–110)
Creat: 0.93 mg/dL (ref 0.70–1.35)
Globulin: 2.9 g/dL (calc) (ref 1.9–3.7)
Glucose, Bld: 102 mg/dL — ABNORMAL HIGH (ref 65–99)
Potassium: 4.4 mmol/L (ref 3.5–5.3)
Sodium: 140 mmol/L (ref 135–146)
Total Bilirubin: 0.7 mg/dL (ref 0.2–1.2)
Total Protein: 7.3 g/dL (ref 6.1–8.1)
eGFR: 92 mL/min/{1.73_m2} (ref >=60)

## 2021-03-21 LAB — LIPID PANEL
Cholesterol: 213 mg/dL — ABNORMAL HIGH (ref <200)
HDL: 53 mg/dL (ref >=40)
LDL Cholesterol (Calc): 126 mg/dL (calc) — ABNORMAL HIGH
Non-HDL Cholesterol (Calc): 160 mg/dL (calc) — ABNORMAL HIGH (ref <130)
Total CHOL/HDL Ratio: 4 (calc) (ref <5.0)
Triglycerides: 202 mg/dL — ABNORMAL HIGH (ref <150)

## 2021-03-21 LAB — RPR: RPR Ser Ql: NONREACTIVE

## 2021-03-21 LAB — HIV-1 RNA QUANT-NO REFLEX-BLD
HIV 1 RNA Quant: NOT DETECTED Copies/mL
HIV-1 RNA Quant, Log: NOT DETECTED Log cps/mL

## 2021-03-22 ENCOUNTER — Telehealth: Payer: Self-pay

## 2021-03-22 NOTE — Telephone Encounter (Signed)
Patient made aware of recent lab results. Patient verbalized understanding, no concerns voiced.  Valarie Cones

## 2021-03-22 NOTE — Telephone Encounter (Signed)
-----   Message from Veryl Speak, FNP sent at 03/22/2021  3:24 PM EST ----- Please inform Mr. Vu that his viral load is undetectable and CD4 count is 683. Kidney function, liver function and electrolytes within normal ranges.

## 2021-03-24 NOTE — Progress Notes (Deleted)
Cardiology Office Note  Date:  03/24/2021   ID:  Douglas Conway, DOB [birth year], MRN [id number]  PCP:  Jearld Fenton, NP   No chief complaint on file.   HPI:  65 year old male with a history of  CAD,  ischemic cardiomyopathy,  HFrEF,  HIV,  hypothyroidism,  hypertension,  hyperlipidemia,  tobacco abuse,  noncompliance,  hospitalizations in June and July for non-STEMI and heart failure Who presents for follow-up of his coronary disease, diastolic and systolic CHF    7824,  anterior STEMI underwent PCI and drug-eluting stent placement to the LAD.   LV dysfunction (25%),   needed an ICD, but declined.    lost to follow-up   Echo in March 2021 showed an EF of 30 to 35% with global hypokinesis.  did not fill those prescriptions.    hospitalized in late June following sudden onset of dyspnea, diaphoresis, and lightheadedness.   syncopal episode.   wide-complex tachycardia at 180 to 190 bpm.   cardioversion at 100 J   placed on amiodarone  ruled in for non-STEMI.   markedly elevated TSH of 65.394.    Echocardiogram showed an EF of less than 20% with global hypokinesis.   catheterization revealing a total occlusion of the proximal/mid LAD at the site of prior stent with right to left collaterals.  He otherwise had minor irregularities/nonobstructive disease.    left AMA following catheterization  presented back to the emergency department  with chest pain, diaphoresis, and near syncope. used both methamphetamines and cocaine.    He was hypertensive and tachycardic on arrival with a wide-complex tachycardia of 197 bpm.   cardioversion at 120 J x 1 and was placed on IV amiodarone,   LifeVest placed, not felt to be an ICD candidate in the setting of recent drug use.  In f/u, not been wearing the LifeVest.  "uncomfortable"  1 episode where he became tachycardic and felt presyncopal.   He asked a friend to go to his car and get the [place] but when the friend returned with  it, symptoms had abated.    smoke cigarettes though he has cut down to about a third of a pack a day.   He uses crystal meth   PMH:   has a past medical history of Change in hearing, bilateral (23/53/6144), Chronic systolic congestive heart failure ([place]), Coronary artery disease, Drug abuse ([place]), HIV disease ([place]) (06/28/2018), Hyperlipidemia LDL goal <70, Hypertension (06/28/2018), Hypothyroidism (01/07/2019), Ischemic cardiomyopathy, Lateral epicondylitis (01/07/2019), Noncompliance, STEMI (ST elevation myocardial infarction) ([place]), Tobacco abuse, and Wide-complex tachycardia.  PSH:    Past Surgical History:  Procedure Laterality Date   CORONARY ANGIOPLASTY WITH STENT PLACEMENT     LEFT HEART CATH AND CORONARY ANGIOGRAPHY N/A 10/08/2020   Procedure: LEFT HEART CATH AND CORONARY ANGIOGRAPHY;  Surgeon: Wellington Hampshire, MD;  Location: [facility];  Service: Cardiovascular;  Laterality: N/A;    Current Outpatient Medications  Medication Sig Dispense Refill   amiodarone (PACERONE) 200 MG tablet Take 1 tablet (200 mg total) by mouth daily. 90 tablet 1   aspirin 81 MG EC tablet Take 1 tablet (81 mg total) by mouth daily. Swallow whole. 90 tablet 3   atorvastatin (LIPITOR) 20 MG tablet Take 1 tablet (20 mg total) by mouth daily. 90 tablet 3   budesonide-formoterol (SYMBICORT) 80-4.5 MCG/ACT inhaler Inhale 2 puffs into the lungs 2 (two) times daily. 1 each 5   cabotegravir & rilpivirine ER (CABENUVA) 600 & 900 MG/3ML injection Inject 1  kit into the muscle every 2 (two) months. 6 mL 5   Cholecalciferol (VITAMIN D-3) 125 MCG (5000 UT) TABS Take 5,000 Units by mouth daily.     levothyroxine (SYNTHROID) 175 MCG tablet Take 1 tablet (175 mcg total) by mouth daily before breakfast. 90 tablet 1   losartan (COZAAR) 25 MG tablet Take 1 tablet (25 mg total) by mouth daily. 90 tablet 3   metoprolol succinate (TOPROL XL) 25 MG 24 hr tablet Take 0.5 tablets (12.5 mg total) by mouth daily. 30 tablet 5    sildenafil (VIAGRA) 50 MG tablet Take 1 tablet (50 mg total) by mouth daily as needed for erectile dysfunction. 30 tablet 3   spironolactone (ALDACTONE) 25 MG tablet Take 0.5 tablets (12.5 mg total) by mouth daily. 45 tablet 3   No current facility-administered medications for this visit.     Allergies:   Plavix [clopidogrel bisulfate]   Social History:  The patient  reports that he has been smoking cigarettes. He started smoking about 53 years ago. He has a 21.00 pack-year smoking history. He has never used smokeless tobacco. He reports that he does not currently use alcohol after a past usage of about 1.0 standard drink per week. He reports that he does not currently use drugs after having used the following drugs: Methamphetamines.   Family History:   family history includes Diabetes Mellitus II in his father.    Review of Systems: ROS   PHYSICAL EXAM: VS:  There were no vitals taken for this visit. , BMI There is no height or weight on file to calculate BMI. GEN: Well nourished, well developed, in no acute distress HEENT: normal Neck: no JVD, carotid bruits, or masses Cardiac: RRR; no murmurs, rubs, or gallops,no edema  Respiratory:  clear to auscultation bilaterally, normal work of breathing GI: soft, nontender, nondistended, + BS MS: no deformity or atrophy Skin: warm and dry, no rash Neuro:  Strength and sensation are intact Psych: euthymic mood, full affect    Recent Labs: 01/05/2021: B Natriuretic Peptide 184.1; Hemoglobin 14.2; Magnesium 1.9; Platelets 238; TSH 66.000 03/18/2021: ALT 14; BUN 22; Creat 0.93; Potassium 4.4; Sodium 140    Lipid Panel Lab Results  Component Value Date   CHOL 213 (H) 03/18/2021   HDL 53 03/18/2021   LDLCALC 126 (H) 03/18/2021   TRIG 202 (H) 03/18/2021      Wt Readings from Last 3 Encounters:  03/18/21 197 lb 3.2 oz (89.4 kg)  01/29/21 193 lb (87.5 kg)  12/30/20 188 lb 4 oz (85.4 kg)       ASSESSMENT AND PLAN:  Problem  List Items Addressed This Visit       Cardiology Problems   Hypertension   Chronic systolic CHF (congestive heart failure) ([place]) - Primary   Hyperlipidemia   Coronary artery disease     Other   Polysubstance abuse ([place])   Other Visit Diagnoses     Ischemic cardiomyopathy       Essential hypertension       Wide-complex tachycardia            Disposition:   F/U  12 months   Total encounter time more than 25 minutes  Greater than 50% was spent in counseling and coordination of care with the patient    Signed, Esmond Plants, M.D., Ph.D. [place], [place]

## 2021-03-26 ENCOUNTER — Ambulatory Visit: Payer: Medicaid Other | Admitting: Cardiovascular Disease

## 2021-03-26 DIAGNOSIS — I5022 Chronic systolic (congestive) heart failure: Secondary | ICD-10-CM

## 2021-03-26 DIAGNOSIS — I2510 Atherosclerotic heart disease of native coronary artery without angina pectoris: Secondary | ICD-10-CM

## 2021-03-26 DIAGNOSIS — I10 Essential (primary) hypertension: Secondary | ICD-10-CM

## 2021-03-26 DIAGNOSIS — I255 Ischemic cardiomyopathy: Secondary | ICD-10-CM

## 2021-03-26 DIAGNOSIS — R000 Tachycardia, unspecified: Secondary | ICD-10-CM

## 2021-03-26 DIAGNOSIS — F1910 Other psychoactive substance abuse, uncomplicated: Secondary | ICD-10-CM

## 2021-03-26 DIAGNOSIS — E782 Mixed hyperlipidemia: Secondary | ICD-10-CM

## 2021-04-27 ENCOUNTER — Other Ambulatory Visit (HOSPITAL_COMMUNITY): Payer: Self-pay

## 2021-05-07 ENCOUNTER — Telehealth: Payer: Self-pay | Admitting: Nurse Practitioner

## 2021-05-07 NOTE — Telephone Encounter (Signed)
Noted.  With patient nonadherence to medical therapy, device therapy, and follow-up we can no longer renew his LifeVest order.  Recommend patient to schedule a follow-up appointment.

## 2021-05-07 NOTE — Telephone Encounter (Signed)
Courtney with Life vest following up on orders for life vest. She inquired if they should end use given that they have been unable to reach patient. He has not had a download since 01/06/21 and that report showed he only wore it for 56 minutes. Since that time we have not seen him here in the office. Will update providers of his noncompliance and exhausted efforts attempting to reach patient.   His last appointment was on 12/10/20 with Ward Givens NP. Since then he has had the following appointments:  03/17/21 with Ward Givens NP and did not show up 03/26/21 with Dr. Mariah Milling and did not show up 02/09/21 with Dr. Ladona Ridgel and did not show up 02/16/21 with Ward Givens NP and canceled 02/01/21 with Dr. Johney Frame and did not show up

## 2021-05-07 NOTE — Telephone Encounter (Signed)
I agree with Alycia Rossetti.  He will have to reestablish care to determine if ongoing lifevest therapy is appropriate at this point.

## 2021-05-17 ENCOUNTER — Other Ambulatory Visit (HOSPITAL_COMMUNITY): Payer: Self-pay

## 2021-05-18 ENCOUNTER — Telehealth: Payer: Self-pay

## 2021-05-18 NOTE — Telephone Encounter (Signed)
[facility] Patient Advocate Encounter ° °Patient's medication (Cabenuva) have been couriered to [facility] from [facility] and will be administered on patient next office visit on 05/24/21. ° °Dodger Sinning , CPhT °Specialty Pharmacy Patient Advocate °[facility] °Phone: [phone] °Fax:  [phone]  °

## 2021-05-24 ENCOUNTER — Ambulatory Visit (INDEPENDENT_AMBULATORY_CARE_PROVIDER_SITE_OTHER): Payer: Medicaid Other | Admitting: Pharmacist

## 2021-05-24 ENCOUNTER — Other Ambulatory Visit: Payer: Self-pay

## 2021-05-24 DIAGNOSIS — Z21 Asymptomatic human immunodeficiency virus [HIV] infection status: Secondary | ICD-10-CM

## 2021-05-24 MED ORDER — FLUCONAZOLE 100 MG PO TABS: 100.0000 mg | ORAL_TABLET | Freq: Every day | ORAL | 0 refills | Status: DC

## 2021-05-24 MED ORDER — CABOTEGRAVIR & RILPIVIRINE ER 600 & 900 MG/3ML IM SUER
1.0000 | Freq: Once | INTRAMUSCULAR | Status: AC
  Administered 2021-05-24: 1 via INTRAMUSCULAR

## 2021-05-24 NOTE — Progress Notes (Signed)
HPI: Douglas Conway is a 66 y.o. male who presents to the [facility] clinic for [place] administration.  Patient Active Problem List   Diagnosis Date Noted   Centrilobular emphysema ([place]) 01/29/2021   Overweight with body mass index (BMI) of 25 to 25.9 in adult 01/29/2021   Coronary artery disease    Polysubstance abuse ([place])    Erectile dysfunction 11/16/2020   NSTEMI (non-ST elevated myocardial infarction) ([place])    Hyperlipidemia    Unstable angina ([place])    Ventricular tachyarrhythmia 62/26/3335   Chronic systolic CHF (congestive heart failure) ([place]) 05/14/2019   Hypothyroidism 01/07/2019   HIV infection ([place]) 06/28/2018   Hypertension 06/28/2018   Healthcare maintenance 06/28/2018    Patient's Medications  New Prescriptions   No medications on file  Previous Medications   AMIODARONE (PACERONE) 200 MG TABLET    Take 1 tablet (200 mg total) by mouth daily.   ASPIRIN 81 MG EC TABLET    Take 1 tablet (81 mg total) by mouth daily. Swallow whole.   ATORVASTATIN (LIPITOR) 20 MG TABLET    Take 1 tablet (20 mg total) by mouth daily.   BUDESONIDE-FORMOTEROL (SYMBICORT) 80-4.5 MCG/ACT INHALER    Inhale 2 puffs into the lungs 2 (two) times daily.   CABOTEGRAVIR & RILPIVIRINE ER (CABENUVA) 600 & 900 MG/3ML INJECTION    Inject 1 kit into the muscle every 2 (two) months.   CHOLECALCIFEROL (VITAMIN D-3) 125 MCG (5000 UT) TABS    Take 5,000 Units by mouth daily.   LEVOTHYROXINE (SYNTHROID) 175 MCG TABLET    Take 1 tablet (175 mcg total) by mouth daily before breakfast.   LOSARTAN (COZAAR) 25 MG TABLET    Take 1 tablet (25 mg total) by mouth daily.   METOPROLOL SUCCINATE (TOPROL XL) 25 MG 24 HR TABLET    Take 0.5 tablets (12.5 mg total) by mouth daily.   SILDENAFIL (VIAGRA) 50 MG TABLET    Take 1 tablet (50 mg total) by mouth daily as needed for erectile dysfunction.   SPIRONOLACTONE (ALDACTONE) 25 MG TABLET    Take 0.5 tablets (12.5 mg total) by mouth daily.  Modified Medications   No  medications on file  Discontinued Medications   No medications on file    Allergies: Allergies  Allergen Reactions   Plavix [Clopidogrel Bisulfate] Rash    Past Medical History: Past Medical History:  Diagnosis Date   Change in hearing, bilateral 45/62/5638   Chronic systolic congestive heart failure ([place])    a. 06/2019 Echo: EF 30-35%, glob HK. gr1 DD. Mod elev RVSP. Midly dil LA, Triv MR/AI. Asc Ao 49m; b. 09/2020 Echo: EF<20%, glob HK. Sev red RV fxn. RVSP 48.46mg. Mod dil LA. Sev dil RA. Mild MR.   Coronary artery disease    a. 03/2005 Ant STEMI s/p PCI/DES to the LAD; b. 05/2010 St echo: reportedly nl; c. 09/2020 Cath: LM nl, LAD 100p/m, D1 min irregs, LCX min irregs, RCA 20p. Dist LAD fills via collats from RPDA-->Med Rx.   Drug abuse ([place]   a. cocaine/methamphetamines.   HIV disease ([place]03/19/2020   Hyperlipidemia LDL goal <70    Hypertension 06/28/2018   Hypothyroidism 01/07/2019   Ischemic cardiomyopathy    a.  06/2019 Echo: EF 30-35%; 09/2020 Echo: EF<20%, glob HK.   Lateral epicondylitis 01/07/2019   Noncompliance    STEMI (ST elevation myocardial infarction) ([place]   Tobacco abuse    Wide-complex tachycardia    a. 10/2020 - SVT w/ aberrancy  vs VT-->s/p DCCV.    Social History: Social History   Socioeconomic History   Marital status: Divorced    Spouse name: Not on file   Number of children: Not on file   Years of education: Not on file   Highest education level: Not on file  Occupational History   Not on file  Tobacco Use   Smoking status: Every Day    Packs/day: 0.50    Years: 42.00    Pack years: 21.00    Types: Cigarettes    Start date: 01/07/1968   Smokeless tobacco: Never   Tobacco comments:    Smoked 1 ppd for most of his adult life but now smoking 2 cigarettes daily  Vaping Use   Vaping Use: Never used  Substance and Sexual Activity   Alcohol use: Not Currently    Alcohol/week: 1.0 standard drink    Types: 1 Cans of beer per week     Comment: 1 can/beer/daily   Drug use: Not Currently    Types: Methamphetamines    Comment: smokes or snorts crystal meth 2-3x/wk.   Sexual activity: Not Currently    Partners: Female    Birth control/protection: Condom    Comment: given condoms  Other Topics Concern   Not on file  Social History Narrative   Lives in [place] by himself.  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   Social Determinants of Health   Financial Resource Strain: Not on file  Food Insecurity: Not on file  Transportation Needs: Not on file  Physical Activity: Not on file  Stress: Not on file  Social Connections: Not on file    Labs: Lab Results  Component Value Date   HIV1RNAQUANT Not Detected 03/18/2021   HIV1RNAQUANT 49 (H) 01/19/2021   HIV1RNAQUANT <20 (H) 11/16/2020   CD4TABS 683 03/18/2021   CD4TABS 668 11/16/2020   CD4TABS 1,112 03/18/2020    RPR and STI Lab Results  Component Value Date   LABRPR NON-REACTIVE 03/18/2021   LABRPR NON-REACTIVE 11/16/2020   LABRPR NON-REACTIVE 03/18/2020   LABRPR NON-REACTIVE 01/07/2019   LABRPR NON-REACTIVE 06/13/2018    STI Results GC CT  06/13/2018 Negative Negative    Hepatitis B Lab Results  Component Value Date   HEPBSAB REACTIVE (A) 06/13/2018   HEPBSAG NON-REACTIVE 06/13/2018   HEPBCAB NON-REACTIVE 06/13/2018   Hepatitis C Lab Results  Component Value Date   HEPCAB REACTIVE (A) 06/13/2018   HCVRNAPCRQN <15 NOT DETECTED 06/13/2018   Hepatitis A Lab Results  Component Value Date   HAV REACTIVE (A) 06/13/2018   Lipids: Lab Results  Component Value Date   CHOL 213 (H) 03/18/2021   TRIG 202 (H) 03/18/2021   HDL 53 03/18/2021   CHOLHDL 4.0 03/18/2021   LDLCALC 126 (H) 03/18/2021    TARGET DATE: The 8th of the month  Assessment: Nikita presents today for their maintenance Cabenuva injections. Past injections were tolerated well without issues. No problems with systemic side effects of injections. Patient does note painful  white bumps consistent with thrush on his tongue and throat. States that he has been forgetting to rinse his mouth out after using his Symbicort. Exam consistent with thrush. Will send in fluconazole Rx for him. Asked him to call us next week if it hasn't resolved.   Administered cabotegravir 600mg /48mL in left upper outer quadrant of the gluteal muscle. Administered rilpivirine 900 mg/46mL in the right upper outer quadrant of the gluteal muscle. Monitored patient for 10 minutes after injection. Injections  were tolerated well without issue. Patient will follow up in 2 months for next set of injections.  Plan: - Cabenuva injections administered - Next injections scheduled for 4/11 with me and 6/12 with [facility] - Call with any issues or questions  Martese Vanatta L. Aneudy Champlain, PharmD, BCIDP, AAHIVP, [place] Clinical Pharmacist Practitioner [facility]

## 2021-05-26 LAB — HIV-1 RNA QUANT-NO REFLEX-BLD
HIV 1 RNA Quant: <20 Copies/mL — ABNORMAL HIGH
HIV-1 RNA Quant, Log: <1.3 Log cps/mL — ABNORMAL HIGH

## 2021-05-31 ENCOUNTER — Other Ambulatory Visit: Payer: Self-pay

## 2021-05-31 ENCOUNTER — Ambulatory Visit: Payer: Medicaid Other | Admitting: Internal Medicine

## 2021-05-31 ENCOUNTER — Encounter: Payer: Self-pay | Admitting: Internal Medicine

## 2021-05-31 VITALS — BP 135/76 | HR 62 | Temp 96.9°F | Wt 198.0 lb

## 2021-05-31 DIAGNOSIS — I5022 Chronic systolic (congestive) heart failure: Secondary | ICD-10-CM | POA: Diagnosis not present

## 2021-05-31 DIAGNOSIS — Z21 Asymptomatic human immunodeficiency virus [HIV] infection status: Secondary | ICD-10-CM | POA: Diagnosis not present

## 2021-05-31 DIAGNOSIS — B370 Candidal stomatitis: Secondary | ICD-10-CM | POA: Diagnosis not present

## 2021-05-31 DIAGNOSIS — J029 Acute pharyngitis, unspecified: Secondary | ICD-10-CM

## 2021-05-31 MED ORDER — MAGIC MOUTHWASH W/LIDOCAINE: 5.0000 mL | Freq: Four times a day (QID) | ORAL | 0 refills | Status: AC | PRN

## 2021-05-31 NOTE — Progress Notes (Signed)
Subjective:    Patient ID: Douglas Conway, male    DOB: 1955/08/02, 66 y.o.   MRN: [id number]  HPI  Patient presents to clinic today with complaint of sore throat.  He noticed this 1 week ago.  He is having difficulty swallowing, more on the left than the right.  He was sent in Fluconazole by ID for thrush on 05/24/2021.  He is using Symbicort but reports he washes his mouth out after each use.  He reports his symptoms have improved but have not resolved.  Review of Systems     Past Medical History:  Diagnosis Date   Change in hearing, bilateral 22/05/5425   Chronic systolic congestive heart failure ([place])    a. 06/2019 Echo: EF 30-35%, glob HK. gr1 DD. Mod elev RVSP. Midly dil LA, Triv MR/AI. Asc Ao 16m; b. 09/2020 Echo: EF<20%, glob HK. Sev red RV fxn. RVSP 48.466mg. Mod dil LA. Sev dil RA. Mild MR.   Coronary artery disease    a. 03/2005 Ant STEMI s/p PCI/DES to the LAD; b. 05/2010 St echo: reportedly nl; c. 09/2020 Cath: LM nl, LAD 100p/m, D1 min irregs, LCX min irregs, RCA 20p. Dist LAD fills via collats from RPDA-->Med Rx.   Drug abuse ([place]   a. cocaine/methamphetamines.   HIV disease ([facility]03/19/2020   Hyperlipidemia LDL goal <70    Hypertension 06/28/2018   Hypothyroidism 01/07/2019   Ischemic cardiomyopathy    a.  06/2019 Echo: EF 30-35%; 09/2020 Echo: EF<20%, glob HK.   Lateral epicondylitis 01/07/2019   Noncompliance    STEMI (ST elevation myocardial infarction) ([facility]   Tobacco abuse    Wide-complex tachycardia    a. 10/2020 - SVT w/ aberrancy vs VT-->s/p DCCV.    Current Outpatient Medications  Medication Sig Dispense Refill   amiodarone (PACERONE) 200 MG tablet Take 1 tablet (200 mg total) by mouth daily. 90 tablet 1   aspirin 81 MG EC tablet Take 1 tablet (81 mg total) by mouth daily. Swallow whole. 90 tablet 3   atorvastatin (LIPITOR) 20 MG tablet Take 1 tablet (20 mg total) by mouth daily. 90 tablet 3   budesonide-formoterol (SYMBICORT) 80-4.5 MCG/ACT inhaler Inhale 2  puffs into the lungs 2 (two) times daily. 1 each 5   cabotegravir & rilpivirine ER (CABENUVA) 600 & 900 MG/3ML injection Inject 1 kit into the muscle every 2 (two) months. 6 mL 5   Cholecalciferol (VITAMIN D-3) 125 MCG (5000 UT) TABS Take 5,000 Units by mouth daily.     fluconazole (DIFLUCAN) 100 MG tablet Take 1 tablet (100 mg total) by mouth daily. 7 tablet 0   levothyroxine (SYNTHROID) 175 MCG tablet Take 1 tablet (175 mcg total) by mouth daily before breakfast. 90 tablet 1   losartan (COZAAR) 25 MG tablet Take 1 tablet (25 mg total) by mouth daily. 90 tablet 3   metoprolol succinate (TOPROL XL) 25 MG 24 hr tablet Take 0.5 tablets (12.5 mg total) by mouth daily. 30 tablet 5   sildenafil (VIAGRA) 50 MG tablet Take 1 tablet (50 mg total) by mouth daily as needed for erectile dysfunction. 30 tablet 3   spironolactone (ALDACTONE) 25 MG tablet Take 0.5 tablets (12.5 mg total) by mouth daily. 45 tablet 3   No current facility-administered medications for this visit.    Allergies  Allergen Reactions   Plavix [Clopidogrel Bisulfate] Rash    Family History  Problem Relation Age of Onset   Diabetes Mellitus II Father  Social History   Socioeconomic History   Marital status: Divorced    Spouse name: Not on file   Number of children: Not on file   Years of education: Not on file   Highest education level: Not on file  Occupational History   Not on file  Tobacco Use   Smoking status: Every Day    Packs/day: 0.50    Years: 42.00    Pack years: 21.00    Types: Cigarettes    Start date: 01/07/1968   Smokeless tobacco: Never   Tobacco comments:    Smoked 1 ppd for most of his adult life but now smoking 2 cigarettes daily  Vaping Use   Vaping Use: Never used  Substance and Sexual Activity   Alcohol use: Not Currently    Alcohol/week: 1.0 standard drink    Types: 1 Cans of beer per week    Comment: 1 can/beer/daily   Drug use: Not Currently    Types: Methamphetamines    Comment:  smokes or snorts crystal meth 2-3x/wk.   Sexual activity: Not Currently    Partners: Female    Birth control/protection: Condom    Comment: given condoms  Other Topics Concern   Not on file  Social History Narrative   Lives in [place] by himself.  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   Social Determinants of Health   Financial Resource Strain: Not on file  Food Insecurity: Not on file  Transportation Needs: Not on file  Physical Activity: Not on file  Stress: Not on file  Social Connections: Not on file  Intimate Partner Violence: Not on file     Constitutional: Denies fever, malaise, fatigue, headache or abrupt weight changes.  HEENT: Patient reports sore throat.  Denies eye pain, eye redness, ear pain, ringing in the ears, wax buildup, runny nose, nasal congestion, bloody nose. Respiratory: Denies difficulty breathing, shortness of breath, cough or sputum production.   Cardiovascular: Denies chest pain, chest tightness, palpitations or swelling in the hands or feet.   No other specific complaints in a complete review of systems (except as listed in HPI above).  Objective:   Physical Exam  BP 135/76 (BP Location: Left Arm, Patient Position: Sitting, Cuff Size: Large)    Pulse 62    Temp (!) 96.9 F (36.1 C) (Temporal)    Wt 198 lb (89.8 kg)    SpO2 96%    BMI 26.12 kg/m   Wt Readings from Last 3 Encounters:  03/18/21 197 lb 3.2 oz (89.4 kg)  01/29/21 193 lb (87.5 kg)  12/30/20 188 lb 4 oz (85.4 kg)    General: Appears his stated age, overweight, in NAD. HEENT: Head: normal shape and size; Throat/Mouth: mucosa pink and moist, grouped bumps noted at the left tonsillar pillar, no exudate or ulcerations noted.  Neck: Anterior cervical adenopathy noted on the left. Cardiovascular: Normal rate and rhythm.  Pulmonary/Chest: Normal effort and positive vesicular breath sounds. No respiratory distress. No wheezes, rales or ronchi noted.  Neurological: Alert and  oriented. BMET    Component Value Date/Time   NA 140 03/18/2021 1419   NA 139 12/10/2020 1442   K 4.4 03/18/2021 1419   CL 105 03/18/2021 1419   CO2 25 03/18/2021 1419   GLUCOSE 102 (H) 03/18/2021 1419   BUN 22 03/18/2021 1419   BUN 28 (H) 12/10/2020 1442   CREATININE 0.93 03/18/2021 1419   CALCIUM 9.5 03/18/2021 1419   GFRNONAA >60 01/05/2021 0431  GFRNONAA 77 03/18/2020 1457   GFRAA 90 03/18/2020 1457    Lipid Panel     Component Value Date/Time   CHOL 213 (H) 03/18/2021 1419   CHOL 196 06/12/2019 1053   TRIG 202 (H) 03/18/2021 1419   HDL 53 03/18/2021 1419   HDL 57 06/12/2019 1053   CHOLHDL 4.0 03/18/2021 1419   LDLCALC 126 (H) 03/18/2021 1419    CBC    Component Value Date/Time   WBC 6.4 01/05/2021 0431   RBC 4.40 01/05/2021 0431   HGB 14.2 01/05/2021 0431   HGB 15.3 06/12/2019 1053   HCT 40.5 01/05/2021 0431   HCT 43.2 06/12/2019 1053   PLT 238 01/05/2021 0431   PLT 346 06/12/2019 1053   MCV 92.0 01/05/2021 0431   MCV 90 06/12/2019 1053   MCH 32.3 01/05/2021 0431   MCHC 35.1 01/05/2021 0431   RDW 13.7 01/05/2021 0431   RDW 12.4 06/12/2019 1053   LYMPHSABS 2.2 01/05/2021 0431   MONOABS 0.5 01/05/2021 0431   EOSABS 0.1 01/05/2021 0431   BASOSABS 0.1 01/05/2021 0431    Hgb A1C No results found for: HGBA1C         Assessment & Plan:   Sore Throat, Thrush:  ID notes reviewed Thrush seems to have resolved Depo-Medrol 80 mg IM today Rx for Magic Mouthwash with Lidocaine 5 mL 4 times daily until resolved  Return precautions discussed Webb Silversmith, NP This visit occurred during the SARS-CoV-2 public health emergency.  Safety protocols were in place, including screening questions prior to the visit, additional usage of staff PPE, and extensive cleaning of exam room while observing appropriate contact time as indicated for disinfecting solutions.

## 2021-05-31 NOTE — Patient Instructions (Signed)
Sore Throat When you have a sore throat, your throat may feel: Tender. Burning. Irritated. Scratchy. Painful when you swallow. Painful when you talk. Many things can cause a sore throat, such as: An infection. Allergies. Dry air. Smoke or pollution. Radiation treatment for cancer. Gastroesophageal reflux disease (GERD). A tumor. A sore throat can be the first sign of another sickness. It can happen with other problems, like: Coughing. Sneezing. Fever. Swelling of the glands in the neck. Most sore throats go away without treatment. Follow these instructions at home:   Medicines Take over-the-counter and prescription medicines only as told by your doctor. Children often get sore throats. Do not give your child aspirin. Use throat sprays to soothe your throat as told by your health care provider. Managing pain To help with pain: Sip warm liquids, such as broth, herbal tea, or warm water. Eat or drink cold or frozen liquids, such as frozen ice pops. Rinse your mouth (gargle) with a salt water mixture 3-4 times a day or as needed. To make salt water, dissolve -1 tsp (3-6 g) of salt in 1 cup (237 mL) of warm water. Do not swallow this mixture. Suck on hard candy or throat lozenges. Put a cool-mist humidifier in your bedroom at night. Sit in the bathroom with the door closed for 5-10 minutes while you run hot water in the shower. General instructions Do not smoke or use any products that contain nicotine or tobacco. If you need help quitting, ask your doctor. Get plenty of rest. Drink enough fluid to keep your pee (urine) pale yellow. Wash your hands often for at least 20 seconds with soap and water. If soap and water are not available, use hand sanitizer. Contact a doctor if: You have a fever for more than 2-3 days. You keep having symptoms for more than 2-3 days. Your throat does not get better in 7 days. You have a fever and your symptoms suddenly get worse. Your child  who is 3 months to 3 years old has a temperature of 102.2F (39C) or higher. Get help right away if: You have trouble breathing. You cannot swallow fluids, soft foods, or your spit. You have swelling in your throat or neck that gets worse. You feel like you may vomit (nauseous) and this feeling lasts a long time. You cannot stop vomiting. These symptoms may be an emergency. Get help right away. Call your local emergency services (911 in the [place] Do not wait to see if the symptoms will go away. Do not drive yourself to the hospital. Summary A sore throat is a painful, burning, irritated, or scratchy throat. Many things can cause a sore throat. Take over-the-counter medicines only as told by your doctor. Get plenty of rest. Drink enough fluid to keep your pee (urine) pale yellow. Contact a doctor if your symptoms get worse or your sore throat does not get better within 7 days. This information is not intended to replace advice given to you by your health care provider. Make sure you discuss any questions you have with your health care provider. Document Revised: 06/24/2020 Document Reviewed: 06/24/2020 Elsevier Patient Education  2022 Elsevier Inc.  

## 2021-07-13 ENCOUNTER — Other Ambulatory Visit (HOSPITAL_COMMUNITY): Payer: Self-pay

## 2021-07-14 ENCOUNTER — Telehealth: Payer: Self-pay

## 2021-07-14 ENCOUNTER — Ambulatory Visit: Payer: Self-pay | Admitting: *Deleted

## 2021-07-14 NOTE — Telephone Encounter (Signed)
?  Chief Complaint: Left outer forearm is red and swollen and painful to the touch. 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  There is a bump where the pain is radiating from that is possibly a bug bite.   He is not sure. ?Frequency: Injured it last week. ?Pertinent Negatives: Patient denies Fever or chills. ?Disposition: [] ED /[] Urgent Care (no appt availability in office) / [] Appointment(In office/virtual)/ []  [facility]/ [] Home Care/ [] Refused Recommended Disposition /[] [facility]/ []  Follow-up with PCP ?Additional Notes: Office is closed for lunch and there are no openings.   He doesn't do virtual visits.   Note sent to see if he can be worked in for an appt.  ?

## 2021-07-14 NOTE — Telephone Encounter (Signed)
We do not have any openings.  I spoke with Douglas Conway and advised he to be evaluated at an Urgent Care.  He agreed and will go.  ? ?Thank you,  ?  ?-Vernona Rieger  ?

## 2021-07-14 NOTE — Telephone Encounter (Signed)
Reason for Disposition ?? [1] Red area or streak AND [2] large (> 2 in. or 5 cm) ? ?Answer Assessment - Initial Assessment Questions ?1. ONSET: "When did the pain start?" ?    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   The scrap is mostly healed but there is a place that looks like a possible bug bite that the pain is radiating from.   The left forearm is red and swollen.   Painful to the touch. ?2. LOCATION: "Where is the pain located?" ?    Left outer forearm  ?3. PAIN: "How bad is the pain?" (Scale 1-10; or mild, moderate, severe) ?  - MILD (1-3): doesn't interfere with normal activities ?  - MODERATE (4-7): interferes with normal activities (e.g., work or school) or awakens from sleep ?  - SEVERE (8-10): excruciating pain, unable to do any normal activities, unable to hold a cup of water ?    When he touches it it's painful ?4. WORK OR EXERCISE: "Has there been any recent work or exercise that involved this part of the body?" ?    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 CAUSE: "What do you think is causing the arm pain?" ?    Possible bug bite or it's infected from the scrap last week. ?6. OTHER SYMPTOMS: "Do you have any other symptoms?" (e.g., neck pain, swelling, rash, fever, numbness, weakness) ?    Red and swollen ?7. PREGNANCY: "Is there any chance you are pregnant?" "When was your last menstrual period?" ?    N/A ? ?Protocols used: Arm Pain-A-AH ? ?

## 2021-07-14 NOTE — Telephone Encounter (Signed)
[facility] Patient Advocate Encounter ? ?Patient's medication Kern Reap) have been couriered to [facility] from [facility] and will be administered on the patient next office visit on 07/20/21. ? ?Ileene Patrick , CPhT ?Specialty Pharmacy Patient Advocate ?[facility] ?Phone: [phone] ?Fax:  [phone]  ?

## 2021-07-14 NOTE — Telephone Encounter (Signed)
Reason for Disposition ? [1] Fever AND [2] area is very tender to touch ? ?Answer Assessment - Initial Assessment Questions ?1. TYPE of INSECT: "What type of insect was it?"  ?    Last week I scrapped my arm up but it has healed.   My arm is hot and swollen and there is a bite looking place where the pain is radiating from.   Left arm ?2. ONSET: "When did you get bitten?"  ?    Last week ?3. LOCATION: "Where is the insect bite located?"  ?    Outter left forearm   ?4. REDNESS: "Is the area red or pink?" If Yes, ask: "What size is area of redness?" (inches or cm). "When did the redness start?" ?    Redness ?5. PAIN: "Is there any pain?" If Yes, ask: "How bad is it?"  (Scale 1-10; or mild, moderate, severe) ?    Yes  ?6. ITCHING: "Does it itch?" If Yes, ask: "How bad is the itch?"  ?  - MILD: doesn't interfere with normal activities ?  - MODERATE-SEVERE: interferes with work, school, sleep, or other activities  ?    No ?7. SWELLING: "How big is the swelling?" (inches, cm, or compare to coins) ?    It's 3 inches below my elbow.   Swelling is below and above my elbow. ?8. OTHER SYMPTOMS: "Do you have any other symptoms?"  (e.g., difficulty breathing, hives) ?    *No Answer* ?9. PREGNANCY: "Is there any chance you are pregnant?" "When was your last menstrual period?" ?    *No Answer* ? ?Protocols used: Insect Bite-A-AH ? ?

## 2021-07-15 ENCOUNTER — Other Ambulatory Visit: Payer: Self-pay | Admitting: Internal Medicine

## 2021-07-15 DIAGNOSIS — I472 Ventricular tachycardia, unspecified: Secondary | ICD-10-CM

## 2021-07-16 NOTE — Telephone Encounter (Signed)
Requested medication (s) are due for refill today: yes ? ?Requested medication (s) are on the active medication list: yes ? ?Last refill:  01/29/2021 #90 with 1 RF ? ?Future visit scheduled: 08/03/21 ? ?Notes to clinic: Not delegated, TSH elevated and EKG and CXR missing, please assess. ? ? ?  ? ?Requested Prescriptions  ?Pending Prescriptions Disp Refills  ? amiodarone (PACERONE) 200 MG tablet [Pharmacy Med Name: Amiodarone HCl 200 MG Oral Tablet] 90 tablet 0  ?  Sig: Take 1 tablet by mouth once daily  ?  ? Not Delegated - Cardiovascular: Antiarrhythmic Agents - amiodarone Failed - 07/15/2021  6:38 PM  ?  ?  Failed - This refill cannot be delegated  ?  ?  Failed - Manual Review: Eye exam recommended every 12 months  ?  ?  Failed - TSH in normal range and within 360 days  ?  TSH  ?Date Value Ref Range Status  ?01/05/2021 66.000 (H) 0.350 - 4.500 uIU/mL Final  ?  Comment:  ?  Performed by a 3rd Generation assay with a functional sensitivity of <=0.01 uIU/mL. ?Performed at [facility], [address] [place], [place] [address] ?  ?06/12/2019 7.300 (H) 0.450 - 4.500 uIU/mL Final  ?  ?  ?  ?  Failed - Patient had ECG in the last 180 days  ?  ?  Failed - Patient had chest x-ray within the last 6 months  ?  ?  Passed - Mg Level in normal range and within 360 days  ?  Magnesium  ?Date Value Ref Range Status  ?01/05/2021 1.9 1.7 - 2.4 mg/dL Final  ?  Comment:  ?  Performed at [facility], [address] [place], [place] [address]  ?  ?  ?  ?  Passed - K in normal range and within 180 days  ?  Potassium  ?Date Value Ref Range Status  ?03/18/2021 4.4 3.5 - 5.3 mmol/L Final  ?  ?  ?  ?  Passed - AST in normal range and within 180 days  ?  AST  ?Date Value Ref Range Status  ?03/18/2021 19 10 - 35 U/L Final  ?  ?  ?  ?  Passed - ALT in normal range and within 180 days  ?  ALT  ?Date Value Ref Range Status  ?03/18/2021 14 9 - 46 U/L Final  ?  ?  ?  ?  Passed - Patient is not pregnant  ?  ?  Passed - Last BP  in normal range  ?  BP Readings from Last 1 Encounters:  ?05/31/21 135/76  ?  ?  ?  ?  Passed - Last Heart Rate in normal range  ?  Pulse Readings from Last 1 Encounters:  ?05/31/21 62  ?  ?  ?  ?  Passed - Valid encounter within last 6 months  ?  Recent Outpatient Visits   ? ?      ? 1 month ago Sore throat  ? [facility] [place], [place] W, NP  ? 5 months ago Need for immunization against influenza  ? [facility] [place], Salvadore Oxford, NP  ? ?  ?  ?Future Appointments   ? ?        ? In 2 weeks Baity, Salvadore Oxford, NP [facility]  ? ?  ? ?  ?  ?  ? ? ?

## 2021-07-20 ENCOUNTER — Ambulatory Visit (INDEPENDENT_AMBULATORY_CARE_PROVIDER_SITE_OTHER): Payer: Medicaid Other | Admitting: Pharmacist

## 2021-07-20 ENCOUNTER — Other Ambulatory Visit: Payer: Self-pay

## 2021-07-20 DIAGNOSIS — N521 Erectile dysfunction due to diseases classified elsewhere: Secondary | ICD-10-CM

## 2021-07-20 DIAGNOSIS — I472 Ventricular tachycardia, unspecified: Secondary | ICD-10-CM

## 2021-07-20 DIAGNOSIS — B20 Human immunodeficiency virus [HIV] disease: Secondary | ICD-10-CM | POA: Diagnosis present

## 2021-07-20 MED ORDER — SILDENAFIL CITRATE 50 MG PO TABS: 50.0000 mg | ORAL_TABLET | Freq: Every day | ORAL | 3 refills | Status: AC | PRN

## 2021-07-20 MED ORDER — AMIODARONE HCL 200 MG PO TABS: 200.0000 mg | ORAL_TABLET | Freq: Every day | ORAL | 0 refills | Status: AC

## 2021-07-20 MED ORDER — CABOTEGRAVIR & RILPIVIRINE ER 600 & 900 MG/3ML IM SUER
1.0000 | Freq: Once | INTRAMUSCULAR | Status: AC
  Administered 2021-07-20: 1 via INTRAMUSCULAR

## 2021-07-20 NOTE — Patient Instructions (Signed)
I will send in one month of your amiodarone so you do not run out. Please call your cardiologist at [facility] in [place] at [phone].  ? ?Ginger Leeth L. Kayli Beal, PharmD ?[facility] Clinical Pharmacist Practitioner ? ?

## 2021-07-20 NOTE — Progress Notes (Signed)
? ?HPI: Douglas Conway is a 66 y.o. male who presents to the [facility] clinic for [place] administration. ? ?Patient Active Problem List  ? Diagnosis Date Noted  ? Centrilobular emphysema ([place]) 01/29/2021  ? Overweight with body mass index (BMI) of 25 to 25.9 in adult 01/29/2021  ? Coronary artery disease   ? Polysubstance abuse ([place])   ? Erectile dysfunction 11/16/2020  ? NSTEMI (non-ST elevated myocardial infarction) ([place])   ? Hyperlipidemia   ? Unstable angina ([facility])   ? Chronic systolic CHF (congestive heart failure) ([place]) 05/14/2019  ? Hypothyroidism 01/07/2019  ? HIV infection ([place]) 06/28/2018  ? Hypertension 06/28/2018  ? ? ?Patient's Medications  ?New Prescriptions  ? No medications on file  ?Previous Medications  ? ASPIRIN 81 MG EC TABLET    Take 1 tablet (81 mg total) by mouth daily. Swallow whole.  ? ATORVASTATIN (LIPITOR) 20 MG TABLET    Take 1 tablet (20 mg total) by mouth daily.  ? BUDESONIDE-FORMOTEROL (SYMBICORT) 80-4.5 MCG/ACT INHALER    Inhale 2 puffs into the lungs 2 (two) times daily.  ? CABOTEGRAVIR & RILPIVIRINE ER (CABENUVA) 600 & 900 MG/3ML INJECTION    Inject 1 kit into the muscle every 2 (two) months.  ? CHOLECALCIFEROL (VITAMIN D-3) 125 MCG (5000 UT) TABS    Take 5,000 Units by mouth daily.  ? LEVOTHYROXINE (SYNTHROID) 175 MCG TABLET    Take 1 tablet (175 mcg total) by mouth daily before breakfast.  ? LOSARTAN (COZAAR) 25 MG TABLET    Take 1 tablet (25 mg total) by mouth daily.  ? MAGIC MOUTHWASH W/LIDOCAINE SOLN    Take 5 mLs by mouth 4 (four) times daily as needed for mouth pain.  ? METOPROLOL SUCCINATE (TOPROL XL) 25 MG 24 HR TABLET    Take 0.5 tablets (12.5 mg total) by mouth daily.  ? SPIRONOLACTONE (ALDACTONE) 25 MG TABLET    Take 0.5 tablets (12.5 mg total) by mouth daily.  ?Modified Medications  ? Modified Medication Previous Medication  ? AMIODARONE (PACERONE) 200 MG TABLET amiodarone (PACERONE) 200 MG tablet  ?    Take 1 tablet (200 mg total) by mouth daily.    Take 1 tablet by  mouth once daily  ? SILDENAFIL (VIAGRA) 50 MG TABLET sildenafil (VIAGRA) 50 MG tablet  ?    Take 1 tablet (50 mg total) by mouth daily as needed for erectile dysfunction.    Take 1 tablet (50 mg total) by mouth daily as needed for erectile dysfunction.  ?Discontinued Medications  ? No medications on file  ? ? ?Allergies: ?Allergies  ?Allergen Reactions  ? Plavix [Clopidogrel Bisulfate] Rash  ? ? ?Past Medical History: ?Past Medical History:  ?Diagnosis Date  ? 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 Chronic systolic congestive heart failure ([place])   ? a. 06/2019 Echo: EF 30-35%, glob HK. gr1 DD. Mod elev RVSP. Midly dil LA, Triv MR/AI. Asc Ao 56m; b. 09/2020 Echo: EF<20%, glob HK. Sev red RV fxn. RVSP 48.474mg. Mod dil LA. Sev dil RA. Mild MR.  ? Coronary artery disease   ? a. 03/2005 Ant STEMI s/p PCI/DES to the LAD; b. 05/2010 St echo: reportedly nl; c. 09/2020 Cath: LM nl, LAD 100p/m, D1 min irregs, LCX min irregs, RCA 20p. Dist LAD fills via collats from RPDA-->Med Rx.  ? Drug abuse ([facility]  ? a. cocaine/methamphetamines.  ? HIV disease ([place]/19/2020  ? Hyperlipidemia LDL goal <70   ? Hypertension 06/28/2018  ? Hypothyroidism 01/07/2019  ?  Ischemic cardiomyopathy   ? a.  06/2019 Echo: EF 30-35%; 09/2020 Echo: EF<20%, glob HK.  ? Lateral epicondylitis 01/07/2019  ? Noncompliance   ? STEMI (ST elevation myocardial infarction) ([place])   ? Tobacco abuse   ? Wide-complex tachycardia   ? a. 10/2020 - SVT w/ aberrancy vs VT-->s/p DCCV.  ? ? ?Social History: ?Social History  ? ?Socioeconomic History  ? Marital status: Divorced  ?  Spouse name: Not on file  ? Number of children: Not on file  ? Years of education: Not on file  ? Highest education level: Not on file  ?Occupational History  ? Not on file  ?Tobacco Use  ? Smoking status: Every Day  ?  Packs/day: 0.50  ?  Years: 42.00  ?  Pack years: 21.00  ?  Types: Cigarettes  ?  Start date: 01/07/1968  ? Smokeless tobacco: Never  ? Tobacco comments:  ?  Smoked 1 ppd for most of  his adult life but now smoking 2 cigarettes daily  ?Vaping Use  ? Vaping Use: Never used  ?Substance and Sexual Activity  ? Alcohol use: Not Currently  ?  Alcohol/week: 1.0 standard drink  ?  Types: 1 Cans of beer per week  ?  Comment: 1 can/beer/daily  ? Drug use: Not Currently  ?  Types: Methamphetamines  ?  Comment: smokes or snorts crystal meth 2-3x/wk.  ? Sexual activity: Not Currently  ?  Partners: Female  ?  Birth control/protection: Condom  ?  Comment: given condoms  ?Other Topics Concern  ? Not on file  ?Social History Narrative  ? Lives in [place] by himself.  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  ? ?Social Determinants of Health  ? ?Financial Resource Strain: Not on file  ?Food Insecurity: Not on file  ?Transportation Needs: Not on file  ?Physical Activity: Not on file  ?Stress: Not on file  ?Social Connections: Not on file  ? ? ?Labs: ?Lab Results  ?Component Value Date  ? HIV1RNAQUANT <20 (H) 05/24/2021  ? HIV1RNAQUANT Not Detected 03/18/2021  ? HIV1RNAQUANT 49 (H) 01/19/2021  ? CD4TABS 683 03/18/2021  ? CD4TABS 668 11/16/2020  ? CD4TABS 1,112 03/18/2020  ? ? ?RPR and STI ?Lab Results  ?Component Value Date  ? LABRPR NON-REACTIVE 03/18/2021  ? LABRPR NON-REACTIVE 11/16/2020  ? LABRPR NON-REACTIVE 03/18/2020  ? LABRPR NON-REACTIVE 01/07/2019  ? LABRPR NON-REACTIVE 06/13/2018  ? ? ?STI Results GC CT  ?06/13/2018 ?12:00 AM Negative   Negative    ? ? ?Hepatitis B ?Lab Results  ?Component Value Date  ? HEPBSAB REACTIVE (A) 06/13/2018  ? HEPBSAG NON-REACTIVE 06/13/2018  ? HEPBCAB NON-REACTIVE 06/13/2018  ? ?Hepatitis C ?Lab Results  ?Component Value Date  ? HEPCAB REACTIVE (A) 06/13/2018  ? ONGEXBMWUXL <15 NOT DETECTED 06/13/2018  ? ?Hepatitis A ?Lab Results  ?Component Value Date  ? HAV REACTIVE (A) 06/13/2018  ? ?Lipids: ?Lab Results  ?Component Value Date  ? CHOL 213 (H) 03/18/2021  ? TRIG 202 (H) 03/18/2021  ? HDL 53 03/18/2021  ? CHOLHDL 4.0 03/18/2021  ? LDLCALC 126 (H) 03/18/2021  ? ? ?TARGET  DATE: ?8th of the month  ? ?Assessment: ?Amato presents today for their maintenance Cabenuva injections. Past injections were tolerated well without issues. No problems with systemic side effects of injections.  ? ?Administered cabotegravir 611m/3mL in left upper outer quadrant of the gluteal muscle. Administered rilpivirine 900 mg/357min the right upper outer quadrant of the gluteal muscle. Injections  were tolerated well. Patient will follow up in 2 months for next set of injections. ? ?Of note, patient reports that he was recently seen at Urgent Care for a spider bite. He was prescribed an antibiotic and reports adherence, he has about 2-3 days supply remaining. He reports improvement in swelling and pain since starting the antibiotic, and his hand is not swollen anymore. Patient was counseled to keep the bite wound area clean and covered with gauze, and to drain if necessary. If the bite wound is not improved ~1 week after completing the antibiotic, patient was instructed to follow-up with a health care provider for re-evaluation. Patient has an upcoming appointment with his PCP on 4/25. He knows to seek care for the bite wound prior to this, if necessary.  ? ?Patient also expressed concern for running out of amiodarone (only has ~1 week supply remaining) and sildenafil. He has not been able to follow-up with Cardiology. Patient was provided the phone number for [facility] and was advised to schedule an appointment with his Cardiologist.  ? ?Plan: ?- Cabenuva injections administered ?- Next injections scheduled for 6/12 with Marya Amsler and 8/8 with Cassie  ?- Per patient request, one-time refills for amiodarone and sildenafil were sent to [facility] ?- Call with any issues or questions ? ?Vance Peper, PharmD ?PGY1 Pharmacy Resident ?07/20/2021 9:54 AM  ?

## 2021-08-03 ENCOUNTER — Ambulatory Visit: Payer: Medicaid Other | Admitting: Internal Medicine

## 2021-08-03 NOTE — Progress Notes (Deleted)
Subjective:    Patient ID: Douglas Conway, male    DOB: 1956/02/22, 66 y.o.   MRN: [id number]  HPI  Patient presents to clinic today for his annual exam.  Flu: 01/2019 Tetanus: COVID: Pfizer x1, Moderna x4 Pneumovax: 11/2020 Prevnar: 12/2018 Shingrix: Never PSA screening: >2 years ago Colon screening: Vision screening: Dentist:  Diet: Exercise:   Review of Systems  Past Medical History:  Diagnosis Date   Change in hearing, bilateral 49/44/9675   Chronic systolic congestive heart failure ([place])    a. 06/2019 Echo: EF 30-35%, glob HK. gr1 DD. Mod elev RVSP. Midly dil LA, Triv MR/AI. Asc Ao 10m; b. 09/2020 Echo: EF<20%, glob HK. Sev red RV fxn. RVSP 48.447mg. Mod dil LA. Sev dil RA. Mild MR.   Coronary artery disease    a. 03/2005 Ant STEMI s/p PCI/DES to the LAD; b. 05/2010 St echo: reportedly nl; c. 09/2020 Cath: LM nl, LAD 100p/m, D1 min irregs, LCX min irregs, RCA 20p. Dist LAD fills via collats from RPDA-->Med Rx.   Drug abuse ([facility]   a. cocaine/methamphetamines.   [place] [facility]   Hyperlipidemia LDL goal <70    Hypertension 06/28/2018   Hypothyroidism 01/07/2019   Ischemic cardiomyopathy    a.  06/2019 Echo: EF 30-35%; 09/2020 Echo: EF<20%, glob HK.   Lateral epicondylitis 01/07/2019   Noncompliance    [place]) [place] [place]   Tobacco abuse    Wide-complex tachycardia    a. 10/2020 - SVT w/ aberrancy vs VT-->s/p DCCV.    Current Outpatient Medications  Medication Sig Dispense Refill   amiodarone (PACERONE) 200 MG tablet Take 1 tablet (200 mg total) by mouth daily. 30 tablet 0   aspirin 81 MG EC tablet Take 1 tablet (81 mg total) by mouth daily. Swallow whole. 90 tablet 3   atorvastatin (LIPITOR) 20 MG tablet Take 1 tablet (20 mg total) by mouth daily. 90 tablet 3   budesonide-formoterol (SYMBICORT) 80-4.5 MCG/ACT inhaler Inhale 2 puffs into the lungs 2 (two) times daily. 1 each 5   cabotegravir & rilpivirine ER (CABENUVA) 600 &  900 MG/3ML injection Inject 1 kit into the muscle every 2 (two) months. 6 mL 5   Cholecalciferol (VITAMIN D-3) 125 MCG (5000 UT) TABS Take 5,000 Units by mouth daily.     levothyroxine (SYNTHROID) 175 MCG tablet Take 1 tablet (175 mcg total) by mouth daily before breakfast. 90 tablet 1   losartan (COZAAR) 25 MG tablet Take 1 tablet (25 mg total) by mouth daily. 90 tablet 3   magic mouthwash w/lidocaine SOLN Take 5 mLs by mouth 4 (four) times daily as needed for mouth pain. 240 mL 0   metoprolol succinate (TOPROL XL) 25 MG 24 hr tablet Take 0.5 tablets (12.5 mg total) by mouth daily. 30 tablet 5   sildenafil (VIAGRA) 50 MG tablet Take 1 tablet (50 mg total) by mouth daily as needed for erectile dysfunction. 30 tablet 3   spironolactone (ALDACTONE) 25 MG tablet Take 0.5 tablets (12.5 mg total) by mouth daily. 45 tablet 3   No current facility-administered medications for this visit.    Allergies  Allergen Reactions   Plavix [Clopidogrel Bisulfate] Rash    Family History  Problem Relation Age of Onset   Diabetes Mellitus II Father     Social History   Socioeconomic History   Marital status: Divorced    Spouse name: Not on file   Number of children: Not on file  Years of education: Not on file   Highest education level: Not on file  Occupational History   Not on file  Tobacco Use   Smoking status: Every Day    Packs/day: 0.50    Years: 42.00    Pack years: 21.00    Types: Cigarettes    Start date: 01/07/1968   Smokeless tobacco: Never   Tobacco comments:    Smoked 1 ppd for most of his adult life but now smoking 2 cigarettes daily  Vaping Use   Vaping Use: Never used  Substance and Sexual Activity   Alcohol use: Not Currently    Alcohol/week: 1.0 standard drink    Types: 1 Cans of beer per week    Comment: 1 can/beer/daily   Drug use: Not Currently    Types: Methamphetamines    Comment: smokes or snorts crystal meth 2-3x/wk.   Sexual activity: Not Currently     Partners: Female    Birth control/protection: Condom    Comment: given condoms  Other Topics Concern   Not on file  Social History Narrative   Lives in [place] by himself.  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   Social Determinants of Health   Financial Resource Strain: Not on file  Food Insecurity: Not on file  Transportation Needs: Not on file  Physical Activity: Not on file  Stress: Not on file  Social Connections: Not on file  Intimate Partner Violence: Not on file     Constitutional: Denies fever, malaise, fatigue, headache or abrupt weight changes.  HEENT: Denies eye pain, eye redness, ear pain, ringing in the ears, wax buildup, runny nose, nasal congestion, bloody nose, or sore throat. Respiratory: Denies difficulty breathing, shortness of breath, cough or sputum production.   Cardiovascular: Denies chest pain, chest tightness, palpitations or swelling in the hands or feet.  Gastrointestinal: Denies abdominal pain, bloating, constipation, diarrhea or blood in the stool.  GU: Patient reports erectile dysfunction.  Denies urgency, frequency, pain with urination, burning sensation, blood in urine, odor or discharge. Musculoskeletal: Denies decrease in range of motion, difficulty with gait, muscle pain or joint pain and swelling.  Skin: Denies redness, rashes, lesions or ulcercations.  Neurological: Denies dizziness, difficulty with memory, difficulty with speech or problems with balance and coordination.  Psych: Denies anxiety, depression, SI/HI.  No other specific complaints in a complete review of systems (except as listed in HPI above).     Objective:   Physical Exam   There were no vitals taken for this visit. Wt Readings from Last 3 Encounters:  05/31/21 198 lb (89.8 kg)  03/18/21 197 lb 3.2 oz (89.4 kg)  01/29/21 193 lb (87.5 kg)    General: Appears their stated age, well developed, well nourished in NAD. Skin: Warm, dry and intact. No rashes,  lesions or ulcerations noted. HEENT: Head: normal shape and size; Eyes: sclera white, no icterus, conjunctiva pink, PERRLA and EOMs intact; Ears: Tm's gray and intact, normal light reflex; Nose: mucosa pink and moist, septum midline; Throat/Mouth: Teeth present, mucosa pink and moist, no exudate, lesions or ulcerations noted.  Neck:  Neck supple, trachea midline. No masses, lumps or thyromegaly present.  Cardiovascular: Normal rate and rhythm. S1,S2 noted.  No murmur, rubs or gallops noted. No JVD or BLE edema. No carotid bruits noted. Pulmonary/Chest: Normal effort and positive vesicular breath sounds. No respiratory distress. No wheezes, rales or ronchi noted.  Abdomen: Soft and nontender. Normal bowel sounds. No distention or masses noted. Liver,  spleen and kidneys non palpable. Musculoskeletal: Normal range of motion. No signs of joint swelling. No difficulty with gait.  Neurological: Alert and oriented. Cranial nerves II-XII grossly intact. Coordination normal.  Psychiatric: Mood and affect normal. Behavior is normal. Judgment and thought content normal.     BMET    Component Value Date/Time   NA 140 03/18/2021 1419   NA 139 12/10/2020 1442   K 4.4 03/18/2021 1419   CL 105 03/18/2021 1419   CO2 25 03/18/2021 1419   GLUCOSE 102 (H) 03/18/2021 1419   BUN 22 03/18/2021 1419   BUN 28 (H) 12/10/2020 1442   CREATININE 0.93 03/18/2021 1419   CALCIUM 9.5 03/18/2021 1419   GFRNONAA >60 01/05/2021 0431   GFRNONAA 77 03/18/2020 1457   GFRAA 90 03/18/2020 1457    Lipid Panel     Component Value Date/Time   CHOL 213 (H) 03/18/2021 1419   CHOL 196 06/12/2019 1053   TRIG 202 (H) 03/18/2021 1419   HDL 53 03/18/2021 1419   HDL 57 06/12/2019 1053   CHOLHDL 4.0 03/18/2021 1419   LDLCALC 126 (H) 03/18/2021 1419    CBC    Component Value Date/Time   WBC 6.4 01/05/2021 0431   RBC 4.40 01/05/2021 0431   HGB 14.2 01/05/2021 0431   HGB 15.3 06/12/2019 1053   HCT 40.5 01/05/2021 0431    HCT 43.2 06/12/2019 1053   PLT 238 01/05/2021 0431   PLT 346 06/12/2019 1053   MCV 92.0 01/05/2021 0431   MCV 90 06/12/2019 1053   MCH 32.3 01/05/2021 0431   MCHC 35.1 01/05/2021 0431   RDW 13.7 01/05/2021 0431   RDW 12.4 06/12/2019 1053   LYMPHSABS 2.2 01/05/2021 0431   MONOABS 0.5 01/05/2021 0431   EOSABS 0.1 01/05/2021 0431   BASOSABS 0.1 01/05/2021 0431    Hgb A1C No results found for: HGBA1C         Assessment & Plan:   Preventative Health Maintenance:  Encouraged him to get a flu shot in the fall Tetanus Pneumovax and Prevnar UTD He is not a candidate for Shingrix vaccine as this is a live vaccine in the active HIV infection COVID-vaccine UTD Referral to GI for screening colonoscopy Encouraged him to consume a balanced diet and exercise regimen Advised him to see an eye doctor and dentist annually We will check CBC, c-Met, lipid, A1c, PSA today  RTC in 6 months, follow-up chronic conditions Webb Silversmith, NP

## 2021-08-15 ENCOUNTER — Other Ambulatory Visit: Payer: Self-pay | Admitting: Internal Medicine

## 2021-08-15 DIAGNOSIS — E039 Hypothyroidism, unspecified: Secondary | ICD-10-CM

## 2021-08-17 ENCOUNTER — Ambulatory Visit: Payer: Medicaid Other | Admitting: Cardiovascular Disease

## 2021-08-17 NOTE — Progress Notes (Deleted)
Cardiology Office Note  Date:  08/17/2021   ID:  Douglas Conway, DOB [birth year], MRN [id number]  PCP:  Jearld Fenton, NP   No chief complaint on file.   HPI:  66 year old male with a history of CAD, ischemic cardiomyopathy, HFrEF, HIV, hypothyroidism, hypertension, hyperlipidemia, tobacco abuse, noncompliance, who presents for follow-up after hospitalizations in June and July for non-STEMI and heart failure.  Previously seen in our office September 2022 This is my first time seeing him On prior visit September 2022 was not wearing LifeVest He had used methamphetamines earlier that week Was felt he was not an ICD candidate as long as he was using recreational drugs He was on amiodarone for wide-complex tachycardia SVT with aberrancy versus VT. he was encouraged to wear LifeVest on prior office visit     Prior cardiac history as below   Cardiac history dates back to 2006, when he suffered an anterior STEMI underwent PCI and drug-eluting stent placement to the LAD.  He was found to have LV dysfunction at that time (25%), and was initiated medical therapy.  EF remained depressed and patient was advised that he needed an ICD, but declined.  He was lost to follow-up but reestablish care with our [facility] office in 2021.  Echo in March 2021 showed an EF of 30 to 35% with global hypokinesis.  Cardiology follow-up in early June, Toprol-XL and [place] were added.  He did not fill those prescriptions.  He was hospitalized in late June following sudden onset of dyspnea, diaphoresis, and lightheadedness.  He believed he had a syncopal episode.  He called EMS and was found to be in a wide-complex tachycardia at 180 to 190 bpm.  He underwent direct-current cardioversion at 100 J with restoration of sinus rhythm.  He was then placed on amiodarone and transported to [facility] ED.  He subsequently ruled in for non-STEMI.  He was found to have a markedly elevated TSH of 65.394.  Echocardiogram showed an EF of less  than 20% with global hypokinesis.  He underwent diagnostic catheterization revealing a total occlusion of the proximal/mid LAD at the site of prior stent with right to left collaterals.  He otherwise had minor irregularities/nonobstructive disease.  Patient left AMA following catheterization but presented back to the emergency department on July fifth with chest pain, diaphoresis, and near syncope.  He had used both methamphetamines and cocaine.  He was hypertensive and tachycardic on arrival with a wide-complex tachycardia of 197 bpm.  He underwent cardioversion at 120 J x 1 and was placed on IV amiodarone, and admitted for further evaluation.  Following admission, he was relatively hypotensive, limiting guideline directed medical therapy.  He was placed on low-dose losartan and had no further wide-complex tachycardia.  He was agreeable to having a LifeVest placed, as he was not felt to be an ICD candidate in the setting of recent drug use.   He was seen by Dr. Radford Pax in [place] on July 22 at which time he was feeling well.  Recommendation was made for EP and advanced heart failure service referral however, patient requested follow-up in [place].  He notes that over the past month, he has felt well.  He has not been wearing the LifeVest.  He says it is uncomfortable.  He did have 1 episode where he became tachycardic and felt presyncopal.  He asked a friend to go to his car and get the [place] but when the friend returned with it, symptoms had abated.  He feels the amiodarone  is doing a good enough job and does not need a LifeVest.  He has not been having any chest pain and denies dyspnea, PND, orthopnea, syncope, edema, or early satiety.  He notes compliance with his medications.  Unfortunately, he continues to smoke cigarettes though he has cut down to about a third of a pack a day.  He also used crystal meth earlier this week.  He denies cocaine use.  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  PSH:    Past Surgical History:  Procedure Laterality Date   CORONARY ANGIOPLASTY WITH STENT PLACEMENT     LEFT HEART CATH AND CORONARY ANGIOGRAPHY N/A 10/08/2020   Procedure: LEFT HEART CATH AND CORONARY ANGIOGRAPHY;  Surgeon: Wellington Hampshire, MD;  Location: [facility];  Service: Cardiovascular;  Laterality: N/A;    Current Outpatient Medications  Medication Sig Dispense Refill   amiodarone (PACERONE) 200 MG tablet Take 1 tablet (200 mg total) by mouth daily. 30 tablet 0   aspirin 81 MG EC tablet Take 1 tablet (81 mg total) by mouth daily. Swallow whole. 90 tablet 3   atorvastatin (LIPITOR) 20 MG tablet Take 1 tablet (20 mg total) by mouth daily. 90 tablet 3   budesonide-formoterol (SYMBICORT) 80-4.5 MCG/ACT inhaler Inhale 2 puffs into the lungs 2 (two) times daily. 1 each 5   cabotegravir & rilpivirine ER (CABENUVA) 600 & 900 MG/3ML injection Inject 1 kit into the muscle every 2 (two) months. 6 mL 5   Cholecalciferol (VITAMIN D-3) 125 MCG (5000 UT) TABS Take 5,000 Units by mouth daily.     levothyroxine (SYNTHROID) 175 MCG tablet Take 1 tablet (175 mcg total) by mouth daily before breakfast. 90 tablet 1   losartan (COZAAR) 25 MG tablet Take 1 tablet (25 mg total) by mouth daily. 90 tablet 3   magic mouthwash w/lidocaine SOLN Take 5 mLs by mouth 4 (four) times daily as needed for mouth pain. 240 mL 0   metoprolol succinate (TOPROL XL) 25 MG 24 hr tablet Take 0.5 tablets (12.5 mg total) by mouth daily. 30 tablet 5   sildenafil (VIAGRA) 50 MG tablet Take 1 tablet (50 mg total) by  mouth daily as needed for erectile dysfunction. 30 tablet 3   spironolactone (ALDACTONE) 25 MG tablet Take 0.5 tablets (12.5 mg total) by mouth daily. 45 tablet 3   No current facility-administered medications for this visit.     Allergies:   Plavix [clopidogrel bisulfate]   Social History:  The patient  reports that he has been smoking cigarettes. He started smoking about 53 years ago. He has a 21.00 pack-year smoking history. He has never used smokeless tobacco. He reports that he does not currently use alcohol after a past usage of about 1.0 standard drink per week. He reports that he does not currently use drugs after having used the following drugs: Methamphetamines.   Family History:   family history includes Diabetes Mellitus II in his father.    Review of Systems: ROS   PHYSICAL EXAM: VS:  There were no vitals taken for this visit. , BMI There is no height or weight on file to calculate BMI. GEN: Well nourished, well developed, in no acute distress HEENT:  normal Neck: no JVD, carotid bruits, or masses Cardiac: RRR; no murmurs, rubs, or gallops,no edema  Respiratory:  clear to auscultation bilaterally, normal work of breathing GI: soft, nontender, nondistended, + BS MS: no deformity or atrophy Skin: warm and dry, no rash Neuro:  Strength and sensation are intact Psych: euthymic mood, full affect    Recent Labs: 01/05/2021: B Natriuretic Peptide 184.1; Hemoglobin 14.2; Magnesium 1.9; Platelets 238; TSH 66.000 03/18/2021: ALT 14; BUN 22; Creat 0.93; Potassium 4.4; Sodium 140    Lipid Panel Lab Results  Component Value Date   CHOL 213 (H) 03/18/2021   HDL 53 03/18/2021   LDLCALC 126 (H) 03/18/2021   TRIG 202 (H) 03/18/2021      Wt Readings from Last 3 Encounters:  05/31/21 198 lb (89.8 kg)  03/18/21 197 lb 3.2 oz (89.4 kg)  01/29/21 193 lb (87.5 kg)       ASSESSMENT AND PLAN:  Problem List Items Addressed This Visit   None    Disposition:   F/U  12  months   Total encounter time more than 30 minutes  Greater than 50% was spent in counseling and coordination of care with the patient    Signed, Esmond Plants, M.D., Ph.D. [place], [place]

## 2021-08-17 NOTE — Progress Notes (Deleted)
Cardiology Office Note  Date:  08/17/2021   ID:  Douglas Conway, DOB 10-15-1955, MRN [id number]  PCP:  Jearld Fenton, NP   No chief complaint on file.   HPI:  66 year old male with a history of CAD, ischemic cardiomyopathy, HFrEF, HIV, hypothyroidism, hypertension, hyperlipidemia, tobacco abuse, noncompliance, who presents for follow-up after hospitalizations in June and July for non-STEMI and heart failure.  Previously seen in our office September 2022 This is my first time seeing him On prior visit September 2022 was not wearing LifeVest He had used methamphetamines earlier that week Was felt he was not an ICD candidate as long as he was using recreational drugs He was on amiodarone for wide-complex tachycardia SVT with aberrancy versus VT. he was encouraged to wear LifeVest on prior office visit     Prior cardiac history as below   Cardiac history dates back to 2006, when he suffered an anterior STEMI underwent PCI and drug-eluting stent placement to the LAD.  He was found to have LV dysfunction at that time (25%), and was initiated medical therapy.  EF remained depressed and patient was advised that he needed an ICD, but declined.  He was lost to follow-up but reestablish care with our [facility] office in 2021.  Echo in March 2021 showed an EF of 30 to 35% with global hypokinesis.  Cardiology follow-up in early June, Toprol-XL and [place] were added.  He did not fill those prescriptions.  He was hospitalized in late June following sudden onset of dyspnea, diaphoresis, and lightheadedness.  He believed he had a syncopal episode.  He called EMS and was found to be in a wide-complex tachycardia at 180 to 190 bpm.  He underwent direct-current cardioversion at 100 J with restoration of sinus rhythm.  He was then placed on amiodarone and transported to [facility] ED.  He subsequently ruled in for non-STEMI.  He was found to have a markedly elevated TSH of 65.394.  Echocardiogram showed an EF of less  than 20% with global hypokinesis.  He underwent diagnostic catheterization revealing a total occlusion of the proximal/mid LAD at the site of prior stent with right to left collaterals.  He otherwise had minor irregularities/nonobstructive disease.  Patient left AMA following catheterization but presented back to the emergency department on July fifth with chest pain, diaphoresis, and near syncope.  He had used both methamphetamines and cocaine.  He was hypertensive and tachycardic on arrival with a wide-complex tachycardia of 197 bpm.  He underwent cardioversion at 120 J x 1 and was placed on IV amiodarone, and admitted for further evaluation.  Following admission, he was relatively hypotensive, limiting guideline directed medical therapy.  He was placed on low-dose losartan and had no further wide-complex tachycardia.  He was agreeable to having a LifeVest placed, as he was not felt to be an ICD candidate in the setting of recent drug use.   He was seen by Dr. Radford Pax in [place] on July 22 at which time he was feeling well.  Recommendation was made for EP and advanced heart failure service referral however, patient requested follow-up in [place].  He notes that over the past month, he has felt well.  He has not been wearing the LifeVest.  He says it is uncomfortable.  He did have 1 episode where he became tachycardic and felt presyncopal.  He asked a friend to go to his car and get the [place] but when the friend returned with it, symptoms had abated.  He feels the amiodarone  is doing a good enough job and does not need a LifeVest.  He has not been having any chest pain and denies dyspnea, PND, orthopnea, syncope, edema, or early satiety.  He notes compliance with his medications.  Unfortunately, he continues to smoke cigarettes though he has cut down to about a third of a pack a day.  He also used crystal meth earlier this week.  He denies cocaine use.  PMH:   has a past medical history of Change in  hearing, bilateral (91/63/8466), Chronic systolic congestive heart failure ([place]), Coronary artery disease, Drug abuse ([place]), HIV disease ([place]) (06/28/2018), Hyperlipidemia LDL goal <70, Hypertension (06/28/2018), Hypothyroidism (01/07/2019), Ischemic cardiomyopathy, Lateral epicondylitis (01/07/2019), Noncompliance, STEMI (ST elevation myocardial infarction) ([place]), Tobacco abuse, and Wide-complex tachycardia.  PSH:    Past Surgical History:  Procedure Laterality Date   CORONARY ANGIOPLASTY WITH STENT PLACEMENT     LEFT HEART CATH AND CORONARY ANGIOGRAPHY N/A 10/08/2020   Procedure: LEFT HEART CATH AND CORONARY ANGIOGRAPHY;  Surgeon: Wellington Hampshire, MD;  Location: [place] [facility];  Service: Cardiovascular;  Laterality: N/A;    Current Outpatient Medications  Medication Sig Dispense Refill   amiodarone (PACERONE) 200 MG tablet Take 1 tablet (200 mg total) by mouth daily. 30 tablet 0   aspirin 81 MG EC tablet Take 1 tablet (81 mg total) by mouth daily. Swallow whole. 90 tablet 3   atorvastatin (LIPITOR) 20 MG tablet Take 1 tablet (20 mg total) by mouth daily. 90 tablet 3   budesonide-formoterol (SYMBICORT) 80-4.5 MCG/ACT inhaler Inhale 2 puffs into the lungs 2 (two) times daily. 1 each 5   cabotegravir & rilpivirine ER (CABENUVA) 600 & 900 MG/3ML injection Inject 1 kit into the muscle every 2 (two) months. 6 mL 5   Cholecalciferol (VITAMIN D-3) 125 MCG (5000 UT) TABS Take 5,000 Units by mouth daily.     levothyroxine (SYNTHROID) 175 MCG tablet Take 1 tablet (175 mcg total) by mouth daily before breakfast. 90 tablet 1   losartan (COZAAR) 25 MG tablet Take 1 tablet (25 mg total) by mouth daily. 90 tablet 3   magic mouthwash w/lidocaine SOLN Take 5 mLs by mouth 4 (four) times daily as needed for mouth pain. 240 mL 0   metoprolol succinate (TOPROL XL) 25 MG 24 hr tablet Take 0.5 tablets (12.5 mg total) by mouth daily. 30 tablet 5   sildenafil (VIAGRA) 50 MG tablet Take 1 tablet (50 mg total) by  mouth daily as needed for erectile dysfunction. 30 tablet 3   spironolactone (ALDACTONE) 25 MG tablet Take 0.5 tablets (12.5 mg total) by mouth daily. 45 tablet 3   No current facility-administered medications for this visit.     Allergies:   Plavix [clopidogrel bisulfate]   Social History:  The patient  reports that he has been smoking cigarettes. He started smoking about 53 years ago. He has a 21.00 pack-year smoking history. He has never used smokeless tobacco. He reports that he does not currently use alcohol after a past usage of about 1.0 standard drink per week. He reports that he does not currently use drugs after having used the following drugs: Methamphetamines.   Family History:   family history includes Diabetes Mellitus II in his father.    Review of Systems: ROS   PHYSICAL EXAM: VS:  There were no vitals taken for this visit. , BMI There is no height or weight on file to calculate BMI. GEN: Well nourished, well developed, in no acute distress HEENT:  normal Neck: no JVD, carotid bruits, or masses Cardiac: RRR; no murmurs, rubs, or gallops,no edema  Respiratory:  clear to auscultation bilaterally, normal work of breathing GI: soft, nontender, nondistended, + BS MS: no deformity or atrophy Skin: warm and dry, no rash Neuro:  Strength and sensation are intact Psych: euthymic mood, full affect    Recent Labs: 01/05/2021: B Natriuretic Peptide 184.1; Hemoglobin 14.2; Magnesium 1.9; Platelets 238; TSH 66.000 03/18/2021: ALT 14; BUN 22; Creat 0.93; Potassium 4.4; Sodium 140    Lipid Panel Lab Results  Component Value Date   CHOL 213 (H) 03/18/2021   HDL 53 03/18/2021   LDLCALC 126 (H) 03/18/2021   TRIG 202 (H) 03/18/2021      Wt Readings from Last 3 Encounters:  05/31/21 198 lb (89.8 kg)  03/18/21 197 lb 3.2 oz (89.4 kg)  01/29/21 193 lb (87.5 kg)       ASSESSMENT AND PLAN:  Problem List Items Addressed This Visit   None    Disposition:   F/U  12  months   Total encounter time more than 30 minutes  Greater than 50% was spent in counseling and coordination of care with the patient    Signed, Esmond Plants, M.D., Ph.D. [place], [place]

## 2021-08-18 ENCOUNTER — Ambulatory Visit: Payer: Medicaid Other | Admitting: Cardiovascular Disease

## 2021-08-23 NOTE — Progress Notes (Signed)
Cardiology Office Note ? ?Date:  08/24/2021  ? ?ID:  Douglas Conway, DOB October 19, 1955, MRN [id number] ? ?PCP:  Jearld Fenton, NP  ? ?Chief Complaint  ?Patient presents with  ? 12 month follow up   ?  "Doing well." Medications reviewed by the patient verbally.   ? ? ?HPI:  ?66 year old male with a history of CAD, ischemic cardiomyopathy,  ?HFrEF,  ?HIV, hypothyroidism,  ?hypertension,  ?hyperlipidemia,  ?tobacco abuse,  ?noncompliance, who presents for follow-up after hospitalizations in June and July for non-STEMI and heart failure. ? ?Previously seen in our office September 2022 ?This is my first time seeing him ? ? ?On prior visit September 2022 was not wearing LifeVest ?He had used methamphetamines earlier that week ?Was felt he was not an ICD candidate as long as he was using recreational drugs ?He was on amiodarone for wide-complex tachycardia SVT with aberrancy versus VT. he as encouraged to wear LifeVest on prior office visit ? ?On follow-up today he is not wearing LifeVest ?He reported to our CMA that he continues to use meth several days per week ?Reports he does not smoke very much ?Reports good energy, spits wood, mowes, ?Limited secondary to back, scitica ?Denies any near-syncope or syncope ?No symptoms concerning for arrhythmia ?Reports he has been taking his medications, did not take them this morning, systolic pressure 213 ? ?EKG personally reviewed by myself on todays visit ?Normal sinus rhythm rate 55 bpm old anterior MI ? ? ?Prior cardiac history as below ?  Cardiac history dates back to 2006, when he suffered an anterior STEMI underwent PCI and drug-eluting stent placement to the LAD.  He was found to have LV dysfunction at that time (25%), and was initiated medical therapy.  EF remained depressed and patient was advised that he needed an ICD, but declined.  He was lost to follow-up but reestablish care with our [facility] office in 2021.  Echo in March 2021 showed an EF of 30 to 35% with global  hypokinesis.  Cardiology follow-up in early June, Toprol-XL and [place] were added.  He did not fill those prescriptions.  He was hospitalized in late June following sudden onset of dyspnea, diaphoresis, and lightheadedness.  He believed he had a syncopal episode.  He called EMS and was found to be in a wide-complex tachycardia at 180 to 190 bpm.  He underwent direct-current cardioversion at 100 J with restoration of sinus rhythm.  He was then placed on amiodarone and transported to [facility] ED.  He subsequently ruled in for non-STEMI.  He was found to have a markedly elevated TSH of 65.394.  Echocardiogram showed an EF of less than 20% with global hypokinesis.  He underwent diagnostic catheterization revealing a total occlusion of the proximal/mid LAD at the site of prior stent with right to left collaterals.  He otherwise had minor irregularities/nonobstructive disease.  Patient left AMA following catheterization but presented back to the emergency department on July fifth with chest pain, diaphoresis, and near syncope.  He had used both methamphetamines and cocaine.  He was hypertensive and tachycardic on arrival with a wide-complex tachycardia of 197 bpm.  He underwent cardioversion at 120 J x 1 and was placed on IV amiodarone, and admitted for further evaluation.  Following admission, he was relatively hypotensive, limiting guideline directed medical therapy.  He was placed on low-dose losartan and had no further wide-complex tachycardia.  He was agreeable to having a LifeVest placed, as he was not felt to be an ICD candidate  in the setting of recent drug use. ?  ?He was seen by Dr. Radford Pax in [place] on July 22 at which time he was feeling well.  Recommendation was made for EP and advanced heart failure service referral however, patient requested follow-up in [place].  He notes that over the past month, he has felt well.  He has not been wearing the LifeVest.  He says it is uncomfortable.  He did have 1  episode where he became tachycardic and felt presyncopal.  He asked a friend to go to his car and get the [place] but when the friend returned with it, symptoms had abated.  He feels the amiodarone is doing a good enough job and does not need a LifeVest.  He has not been having any chest pain and denies dyspnea, PND, orthopnea, syncope, edema, or early satiety.  He notes compliance with his medications.  Unfortunately, he continues to smoke cigarettes though he has cut down to about a third of a pack a day.  He also used crystal meth earlier this week.  He denies cocaine use. ? ? ?PMH:   has a past medical history of Change in hearing, bilateral (81/27/5170), Chronic systolic congestive heart failure ([place]), Coronary artery disease, Drug abuse ([place]), HIV disease ([place]) (06/28/2018), Hyperlipidemia LDL goal <70, Hypertension (06/28/2018), Hypothyroidism (01/07/2019), Ischemic cardiomyopathy, Lateral epicondylitis (01/07/2019), Noncompliance, STEMI (ST elevation myocardial infarction) ([place]), Tobacco abuse, and Wide-complex tachycardia. ? ?PSH:    ?Past Surgical History:  ?Procedure Laterality Date  ? CORONARY ANGIOPLASTY WITH STENT PLACEMENT    ? LEFT HEART CATH AND CORONARY ANGIOGRAPHY N/A 10/08/2020  ? Procedure: LEFT HEART CATH AND CORONARY ANGIOGRAPHY;  Surgeon: Wellington Hampshire, MD;  Location: [facility];  Service: Cardiovascular;  Laterality: N/A;  ? ? ?Current Outpatient Medications  ?Medication Sig Dispense Refill  ? amiodarone (PACERONE) 200 MG tablet Take 1 tablet (200 mg total) by mouth daily. 30 tablet 0  ? aspirin 81 MG EC tablet Take 1 tablet (81 mg total) by mouth daily. Swallow whole. 90 tablet 3  ? atorvastatin (LIPITOR) 20 MG tablet Take 1 tablet (20 mg total) by mouth daily. 90 tablet 3  ? cabotegravir & rilpivirine ER (CABENUVA) 600 & 900 MG/3ML injection Inject 1 kit into the muscle every 2 (two) months. 6 mL 5  ? Cholecalciferol (VITAMIN D-3) 125 MCG (5000 UT) TABS Take 5,000 Units by  mouth daily.    ? levothyroxine (SYNTHROID) 175 MCG tablet Take 1 tablet (175 mcg total) by mouth daily before breakfast. 90 tablet 1  ? losartan (COZAAR) 25 MG tablet Take 1 tablet (25 mg total) by mouth daily. 90 tablet 3  ? metoprolol succinate (TOPROL XL) 25 MG 24 hr tablet Take 0.5 tablets (12.5 mg total) by mouth daily. 30 tablet 5  ? sildenafil (VIAGRA) 50 MG tablet Take 1 tablet (50 mg total) by mouth daily as needed for erectile dysfunction. 30 tablet 3  ? spironolactone (ALDACTONE) 25 MG tablet Take 0.5 tablets (12.5 mg total) by mouth daily. 45 tablet 3  ? budesonide-formoterol (SYMBICORT) 80-4.5 MCG/ACT inhaler Inhale 2 puffs into the lungs 2 (two) times daily. (Patient not taking: Reported on 08/24/2021) 1 each 5  ? magic mouthwash w/lidocaine SOLN Take 5 mLs by mouth 4 (four) times daily as needed for mouth pain. (Patient not taking: Reported on 08/24/2021) 240 mL 0  ? ?No current facility-administered medications for this visit.  ? ? ? ?Allergies:   Plavix [clopidogrel bisulfate]  ? ?Social History:  The patient  reports that he has been smoking cigarettes. He started smoking about 53 years ago. He has a 21.00 pack-year smoking history. He has never used smokeless tobacco. He reports that he does not currently use alcohol after a past usage of about 1.0 standard drink per week. He reports current drug use. Drug: Methamphetamines.  ? ?Family History:   family history includes Diabetes Mellitus II in his father.  ? ? ?Review of Systems: ?Review of Systems  ?Constitutional: Negative.   ?HENT: Negative.    ?Respiratory: Negative.    ?Cardiovascular: Negative.   ?Gastrointestinal: Negative.   ?Musculoskeletal: Negative.   ?Neurological: Negative.   ?Psychiatric/Behavioral: Negative.    ?All other systems reviewed and are negative. ? ? ?PHYSICAL EXAM: ?VS:  BP 140/72 (BP Location: Left Arm, Patient Position: Sitting, Cuff Size: Normal)   Pulse (!) 55   Ht _0  (1.854 m)   Wt 193 lb 8 oz (87.8 kg)   SpO2  96%   BMI 25.53 kg/m?  , BMI Body mass index is 25.53 kg/m?. ?GEN: Well nourished, well developed, in no acute distress ?HEENT: normal ?Neck: no JVD, carotid bruits, or masses ?Cardiac: RRR; no murmurs, rubs, or ga

## 2021-08-24 ENCOUNTER — Ambulatory Visit: Payer: Medicaid Other | Admitting: Cardiovascular Disease

## 2021-08-24 ENCOUNTER — Telehealth: Payer: Self-pay

## 2021-08-24 ENCOUNTER — Encounter: Payer: Self-pay | Admitting: Cardiovascular Disease

## 2021-08-24 VITALS — BP 140/72 | HR 55 | Ht 73.0 in | Wt 193.5 lb

## 2021-08-24 DIAGNOSIS — F1910 Other psychoactive substance abuse, uncomplicated: Secondary | ICD-10-CM | POA: Diagnosis not present

## 2021-08-24 DIAGNOSIS — R000 Tachycardia, unspecified: Secondary | ICD-10-CM

## 2021-08-24 DIAGNOSIS — I5022 Chronic systolic (congestive) heart failure: Secondary | ICD-10-CM | POA: Diagnosis not present

## 2021-08-24 DIAGNOSIS — E782 Mixed hyperlipidemia: Secondary | ICD-10-CM

## 2021-08-24 DIAGNOSIS — I255 Ischemic cardiomyopathy: Secondary | ICD-10-CM | POA: Diagnosis not present

## 2021-08-24 DIAGNOSIS — I10 Essential (primary) hypertension: Secondary | ICD-10-CM

## 2021-08-24 DIAGNOSIS — I2510 Atherosclerotic heart disease of native coronary artery without angina pectoris: Secondary | ICD-10-CM

## 2021-08-24 MED ORDER — ENTRESTO 49-51 MG PO TABS
1.0000 | ORAL_TABLET | Freq: Two times a day (BID) | ORAL | 11 refills | Status: AC
Start: 2021-08-24 — End: ?

## 2021-08-24 NOTE — Telephone Encounter (Signed)
Prior Authorization for Entresto 49-51 mg tablets required per pharmacy. PA form completed and faxed to [facility] @ [phone]. Waiting for determination. ?

## 2021-08-24 NOTE — Patient Instructions (Addendum)
Call when ready for repeat echo, after you are on entresto ? ? ?Medication Instructions:  ?Please start entresto 49/51 mg twice a day ?When you get the medication, stop the losartan  ? ?If you need a refill on your cardiac medications before your next appointment, please call your pharmacy.  ? ?Lab work: ?No new labs needed ? ?Testing/Procedures: ?No new testing needed ? ?Follow-Up: ?At [facility], you and your health needs are our priority.  As part of our continuing mission to provide you with exceptional heart care, we have created designated Provider Care Teams.  These Care Teams include your primary Cardiologist (physician) and Advanced Practice Providers (APPs -  Physician Assistants and Nurse Practitioners) who all work together to provide you with the care you need, when you need it. ? ?You will need a follow up appointment in 3 months, Leiah Giannotti or APP ? ?Providers on your designated Care Team:   ?Nicolasa Ducking, NP ?Eula Listen, PA-C ?Cadence Fransico Michael, PA-C ? ?COVID-19 Vaccine Information can be found at: [email or web address] For questions related to vaccine distribution or appointments, please email vaccine@[email or web address] .[email or web address] or call [phone].  ? ?

## 2021-08-25 NOTE — Telephone Encounter (Signed)
Douglas Conway 08-14 has been approved from 08/24/21 through 08/19/22. [id number] ?Call reference # [id number] ? ?Spoke with Belenda Cruise at [place] pharmacy to inform them of approval. She states the patient's copay will be $4 and they will let him know it is ready for pick-up. ?

## 2021-08-31 ENCOUNTER — Other Ambulatory Visit: Payer: Self-pay | Admitting: Internal Medicine

## 2021-08-31 DIAGNOSIS — E039 Hypothyroidism, unspecified: Secondary | ICD-10-CM

## 2021-08-31 NOTE — Telephone Encounter (Signed)
Pt called to report he took his last  levothyroxine (SYNTHROID) 175 MCG tablet Last week.  Pt missed his 4/25 appt, and I advised pt to reschedule, and I would follow up w/ his refill request. Pt agreed, appt scheduled for 09/09/21.  [id number] - [address] [place] - [address]

## 2021-09-01 NOTE — Telephone Encounter (Signed)
Requested medication (s) are due for refill today: yes  Requested medication (s) are on the active medication list: yes    Last refill: 01/29/21  #90  1 refill  Future visit scheduled yes 09/09/21  Notes to clinic:No show last appt 08/03/21. Labs due. Please review. Thank you.  Requested Prescriptions  Pending Prescriptions Disp Refills   levothyroxine (SYNTHROID) 175 MCG tablet [Pharmacy Med Name: Levothyroxine Sodium 175 MCG Oral Tablet] 90 tablet 0    Sig: TAKE 1 TABLET BY MOUTH ONCE DAILY BEFORE  BREAKFAST     Endocrinology:  Hypothyroid Agents Failed - 08/31/2021 10:33 AM      Failed - TSH in normal range and within 360 days    TSH  Date Value Ref Range Status  01/05/2021 66.000 (H) 0.350 - 4.500 uIU/mL Final    Comment:    Performed by a 3rd Generation assay with a functional sensitivity of <=0.01 uIU/mL. Performed at [facility], [address] [place], [place] [address]   06/12/2019 7.300 (H) 0.450 - 4.500 uIU/mL Final         Passed - Valid encounter within last 12 months    Recent Outpatient Visits           3 months ago Sore throat   [facility] [place], Salvadore Oxford, NP   7 months ago Need for immunization against influenza   [facility] [place], Salvadore Oxford, NP       Future Appointments             In 1 week Sampson Si, Salvadore Oxford, NP [facility]   In 3 months Brion Aliment, Dois [place], NP [facility]

## 2021-09-09 ENCOUNTER — Ambulatory Visit: Payer: Medicaid Other | Admitting: Internal Medicine

## 2021-09-09 NOTE — Progress Notes (Deleted)
Subjective:    Patient ID: Roshun Klingensmith, male    DOB: 23-May-1955, 66 y.o.   MRN: [id number]  HPI  Patient presents to clinic today for follow-up of chronic conditions.  CHF: He reports chronic cough but denies shortness of breath or swelling in his lower extremities.  He is taking Entresto, Metoprolol and Spironolactone as prescribed.  Echo from 09/2020 reviewed.  He follows with cardiology.  HLD with CAD status post MI: His last LDL was 126, triglycerides 202, 02/2021.  He denies myalgias on Atorvastatin.  He is taking Metoprolol and Aspirin as well.  HIV: His last viral load was <20, CD4 count 683, 03/2021.  He is taking Cabenuva as prescribed.  He follows with ID.  Hypothyroidism: He denies any issues on his current dose of Levothyroxine.  He does not follow with endocrinology.  HTN with Cardiomyopathy: His BP today is.  He is taking Entresto, metoprolol and Spironolactone as prescribed.  ECG from 08/2021 reviewed.  He follows with cardiology  History of V. tach: Managed with Amiodarone and Metoprolol.  ECG from 08/2021 reviewed.  He follows with cardiology  COPD: He reports chronic cough but denies shortness of breath.  He is taking Symbicort as prescribed.  There are no PFTs on file.  He does not follow with pulmonology.  He does continue to smoke.  ED: Managed with sildenafil as needed.  He does not follow with urology.  Review of Systems     Past Medical History:  Diagnosis Date   Change in hearing, bilateral 29/47/6546   Chronic systolic congestive heart failure ([place])    a. 06/2019 Echo: EF 30-35%, glob HK. gr1 DD. Mod elev RVSP. Midly dil LA, Triv MR/AI. Asc Ao 42m; b. 09/2020 Echo: EF<20%, glob HK. Sev red RV fxn. RVSP 48.460mg. Mod dil LA. Sev dil RA. Mild MR.   Coronary artery disease    a. 03/2005 Ant STEMI s/p PCI/DES to the LAD; b. 05/2010 St echo: reportedly nl; c. 09/2020 Cath: LM nl, LAD 100p/m, D1 min irregs, LCX min irregs, RCA 20p. Dist LAD fills via collats from  [facility]DA-->Med Rx.   [place]rug abuse ([place]   a. cocaine/methamphetamines.   HIV disease ([place]/19/2020   Hyperlipidemia LDL goal <70    Hypertension 06/28/2018   Hypothyroidism 01/07/2019   Ischemic cardiomyopathy    a.  06/2019 Echo: EF 30-35%; 09/2020 Echo: EF<20%, glob HK.   Lateral epicondylitis 01/07/2019   Noncompliance    STEMI (ST elevation myocardial infarction) ([place]   Tobacco abuse    Wide-complex tachycardia    a. 10/2020 - SVT w/ aberrancy vs VT-->s/p DCCV.    Current Outpatient Medications  Medication Sig Dispense Refill   amiodarone (PACERONE) 200 MG tablet Take 1 tablet (200 mg total) by mouth daily. 30 tablet 0   aspirin 81 MG EC tablet Take 1 tablet (81 mg total) by mouth daily. Swallow whole. 90 tablet 3   atorvastatin (LIPITOR) 20 MG tablet Take 1 tablet (20 mg total) by mouth daily. 90 tablet 3   budesonide-formoterol (SYMBICORT) 80-4.5 MCG/ACT inhaler Inhale 2 puffs into the lungs 2 (two) times daily. (Patient not taking: Reported on 08/24/2021) 1 each 5   cabotegravir & rilpivirine ER (CABENUVA) 600 & 900 MG/3ML injection Inject 1 kit into the muscle every 2 (two) months. 6 mL 5   Cholecalciferol (VITAMIN D-3) 125 MCG (5000 UT) TABS Take 5,000 Units by mouth daily.     levothyroxine (SYNTHROID) 175 MCG tablet TAKE 1 TABLET  BY MOUTH ONCE DAILY BEFORE  BREAKFAST 30 tablet 0   magic mouthwash w/lidocaine SOLN Take 5 mLs by mouth 4 (four) times daily as needed for mouth pain. (Patient not taking: Reported on 08/24/2021) 240 mL 0   metoprolol succinate (TOPROL XL) 25 MG 24 hr tablet Take 0.5 tablets (12.5 mg total) by mouth daily. 30 tablet 5   sacubitril-valsartan (ENTRESTO) 49-51 MG Take 1 tablet by mouth 2 (two) times daily. 60 tablet 11   sildenafil (VIAGRA) 50 MG tablet Take 1 tablet (50 mg total) by mouth daily as needed for erectile dysfunction. 30 tablet 3   spironolactone (ALDACTONE) 25 MG tablet Take 0.5 tablets (12.5 mg total) by mouth daily. 45 tablet 3   No  current facility-administered medications for this visit.    Allergies  Allergen Reactions   Plavix [Clopidogrel Bisulfate] Rash    Family History  Problem Relation Age of Onset   Diabetes Mellitus II Father     Social History   Socioeconomic History   Marital status: Divorced    Spouse name: Not on file   Number of children: Not on file   Years of education: Not on file   Highest education level: Not on file  Occupational History   Not on file  Tobacco Use   Smoking status: Every Day    Packs/day: 0.50    Years: 42.00    Pack years: 21.00    Types: Cigarettes    Start date: 01/07/1968   Smokeless tobacco: Never   Tobacco comments:    Smoked 1 ppd for most of his adult life but now smoking 2 cigarettes daily  Vaping Use   Vaping Use: Never used  Substance and Sexual Activity   Alcohol use: Not Currently    Alcohol/week: 1.0 standard drink    Types: 1 Cans of beer per week    Comment: 1 can/beer/daily   Drug use: Yes    Types: Methamphetamines    Comment: smokes or snorts crystal meth 2-3x/wk.   Sexual activity: Not Currently    Partners: Female    Birth control/protection: Condom    Comment: given condoms  Other Topics Concern   Not on file  Social History Narrative   Lives in [place] by himself.  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   Social Determinants of Health   Financial Resource Strain: Not on file  Food Insecurity: Not on file  Transportation Needs: Not on file  Physical Activity: Not on file  Stress: Not on file  Social Connections: Not on file  Intimate Partner Violence: Not on file     Constitutional: Denies fever, malaise, fatigue, headache or abrupt weight changes.  HEENT: Denies eye pain, eye redness, ear pain, ringing in the ears, wax buildup, runny nose, nasal congestion, bloody nose, or sore throat. Respiratory: Patient reports chronic cough.  Denies difficulty breathing, shortness of breath, or sputum production.    Cardiovascular: Denies chest pain, chest tightness, palpitations or swelling in the hands or feet.  Gastrointestinal: Denies abdominal pain, bloating, constipation, diarrhea or blood in the stool.  GU: Denies urgency, frequency, pain with urination, burning sensation, blood in urine, odor or discharge. Musculoskeletal: Denies decrease in range of motion, difficulty with gait, muscle pain or joint pain and swelling.  Skin: Denies redness, rashes, lesions or ulcercations.  Neurological: Denies dizziness, difficulty with memory, difficulty with speech or problems with balance and coordination.  Psych: Denies anxiety, depression, SI/HI.  No other specific complaints in a  complete review of systems (except as listed in HPI above).  Objective:   Physical Exam   There were no vitals taken for this visit. Wt Readings from Last 3 Encounters:  08/24/21 193 lb 8 oz (87.8 kg)  05/31/21 198 lb (89.8 kg)  03/18/21 197 lb 3.2 oz (89.4 kg)    General: Appears their stated age, well developed, well nourished in NAD. Skin: Warm, dry and intact. No rashes, lesions or ulcerations noted. HEENT: Head: normal shape and size; Eyes: sclera white, no icterus, conjunctiva pink, PERRLA and EOMs intact; Ears: Tm's gray and intact, normal light reflex; Nose: mucosa pink and moist, septum midline; Throat/Mouth: Teeth present, mucosa pink and moist, no exudate, lesions or ulcerations noted.  Neck:  Neck supple, trachea midline. No masses, lumps or thyromegaly present.  Cardiovascular: Normal rate and rhythm. S1,S2 noted.  No murmur, rubs or gallops noted. No JVD or BLE edema. No carotid bruits noted. Pulmonary/Chest: Normal effort and positive vesicular breath sounds. No respiratory distress. No wheezes, rales or ronchi noted.  Abdomen: Soft and nontender. Normal bowel sounds. No distention or masses noted. Liver, spleen and kidneys non palpable. Musculoskeletal: Normal range of motion. No signs of joint swelling. No  difficulty with gait.  Neurological: Alert and oriented. Cranial nerves II-XII grossly intact. Coordination normal.  Psychiatric: Mood and affect normal. Behavior is normal. Judgment and thought content normal.     BMET    Component Value Date/Time   NA 140 03/18/2021 1419   NA 139 12/10/2020 1442   K 4.4 03/18/2021 1419   CL 105 03/18/2021 1419   CO2 25 03/18/2021 1419   GLUCOSE 102 (H) 03/18/2021 1419   BUN 22 03/18/2021 1419   BUN 28 (H) 12/10/2020 1442   CREATININE 0.93 03/18/2021 1419   CALCIUM 9.5 03/18/2021 1419   GFRNONAA >60 01/05/2021 0431   GFRNONAA 77 03/18/2020 1457   GFRAA 90 03/18/2020 1457    Lipid Panel     Component Value Date/Time   CHOL 213 (H) 03/18/2021 1419   CHOL 196 06/12/2019 1053   TRIG 202 (H) 03/18/2021 1419   HDL 53 03/18/2021 1419   HDL 57 06/12/2019 1053   CHOLHDL 4.0 03/18/2021 1419   LDLCALC 126 (H) 03/18/2021 1419    CBC    Component Value Date/Time   WBC 6.4 01/05/2021 0431   RBC 4.40 01/05/2021 0431   HGB 14.2 01/05/2021 0431   HGB 15.3 06/12/2019 1053   HCT 40.5 01/05/2021 0431   HCT 43.2 06/12/2019 1053   PLT 238 01/05/2021 0431   PLT 346 06/12/2019 1053   MCV 92.0 01/05/2021 0431   MCV 90 06/12/2019 1053   MCH 32.3 01/05/2021 0431   MCHC 35.1 01/05/2021 0431   RDW 13.7 01/05/2021 0431   RDW 12.4 06/12/2019 1053   LYMPHSABS 2.2 01/05/2021 0431   MONOABS 0.5 01/05/2021 0431   EOSABS 0.1 01/05/2021 0431   BASOSABS 0.1 01/05/2021 0431    Hgb A1C No results found for: HGBA1C         Assessment & Plan:    Webb Silversmith, NP

## 2021-09-13 ENCOUNTER — Other Ambulatory Visit (HOSPITAL_COMMUNITY): Payer: Self-pay

## 2021-09-14 ENCOUNTER — Telehealth: Payer: Self-pay

## 2021-09-14 NOTE — Telephone Encounter (Signed)
[facility] Patient Advocate Encounter  Patient's medication Renaldo Harrison) have been couriered to [facility] from [facility] and will be administered on the patient next office visit on 09/20/21.  Clearance Coots , CPhT Specialty Pharmacy Patient [facility] for Infectious Disease Phone: [phone] Fax:  [phone]

## 2021-09-20 ENCOUNTER — Other Ambulatory Visit: Payer: Self-pay

## 2021-09-20 ENCOUNTER — Encounter: Payer: Self-pay | Admitting: Family

## 2021-09-20 ENCOUNTER — Ambulatory Visit (INDEPENDENT_AMBULATORY_CARE_PROVIDER_SITE_OTHER): Payer: Medicaid Other | Admitting: Family

## 2021-09-20 VITALS — BP 154/78 | HR 70 | Temp 97.9°F | Wt 190.0 lb

## 2021-09-20 DIAGNOSIS — N521 Erectile dysfunction due to diseases classified elsewhere: Secondary | ICD-10-CM

## 2021-09-20 DIAGNOSIS — R6882 Decreased libido: Secondary | ICD-10-CM

## 2021-09-20 DIAGNOSIS — F1910 Other psychoactive substance abuse, uncomplicated: Secondary | ICD-10-CM

## 2021-09-20 DIAGNOSIS — Z0000 Encounter for general adult medical examination without abnormal findings: Secondary | ICD-10-CM

## 2021-09-20 DIAGNOSIS — Z21 Asymptomatic human immunodeficiency virus [HIV] infection status: Secondary | ICD-10-CM

## 2021-09-20 DIAGNOSIS — Z113 Encounter for screening for infections with a predominantly sexual mode of transmission: Secondary | ICD-10-CM

## 2021-09-20 MED ORDER — CABOTEGRAVIR & RILPIVIRINE ER 600 & 900 MG/3ML IM SUER
1.0000 | Freq: Once | INTRAMUSCULAR | Status: AC
  Administered 2021-09-20: 1 via INTRAMUSCULAR

## 2021-09-20 NOTE — Assessment & Plan Note (Signed)
Mr. Retzloff has decreased libido and concern for decreased muscle mass and would like his testosterone checked.  He is on a low-dose of spironolactone which should not drastically affect his testosterone levels but may be contributing.  Check testosterone.  If low may need to consider secondary testosterone check and additional work up.

## 2021-09-20 NOTE — Patient Instructions (Addendum)
Nice to see you.  We will check your lab work today including testosterone.   Continue to take your medication daily as prescribed.  Refills have been sent to the pharmacy.  Plan for follow up in 2 months or sooner if needed with lab work on the same day.  Have a great day and stay safe!

## 2021-09-20 NOTE — Assessment & Plan Note (Signed)
   Discussed importance of safe sexual practices and condom use.  Condoms offered.  Declines tetanus vaccine.  Routine dental care up-to-date.

## 2021-09-20 NOTE — Assessment & Plan Note (Signed)
Stable with good benefit with sildenafil.  Reminded of adverse side effects of seeking medical care for priapism as well as avoiding taking sildenafil with nitrates for chest pain.  Continue current dose of sildenafil.

## 2021-09-20 NOTE — Assessment & Plan Note (Signed)
Continues to use methamphetamine.  Working on decreasing usage.  Discussed importance of cessation to reduce risk of further progression of diseases.  He is in the precontemplation stage.

## 2021-09-20 NOTE — Assessment & Plan Note (Signed)
Mr. Dacquisto continues to have adequately controlled virus with good adherence and tolerance to [place].  Reviewed previous lab work and discussed plan of care.  Check blood work today. Cabenuva administered with no complications.  Plan for follow-up in 2 months or sooner if needed.

## 2021-09-20 NOTE — Progress Notes (Signed)
Brief Narrative   Patient ID: Douglas Conway, male    DOB: 1956-01-11, 66 y.o.   MRN: [id number]  Douglas Conway is a 66 y/o caucasian male with HIV disease with risk factor of MSM. Initial clinic entry lab work was 31,800 and CD4 count of 800 while he was incarcerated in March 2020. Genotype was without significant resistance patterns. ART regimen was Biktarvy.    Subjective:    Chief Complaint  Patient presents with   Follow-up    HPI:  Douglas Conway is a 66 y.o. male with HIV disease last seen for q 2 month Cabenuva injection by Aggie Cosier, PharmD, CPP on 07/20/21. Previous lab work showed well controlled virus with undetectable viral load and CD4 count 683. In the interim has been seen by Cardiology with medication adjustments made for heart failure. Here today for follow up.  Douglas Conway has good tolerance to his Renaldo Harrison and has good results with his sildenafil as needed for erectile dysfunction with no adverse side effects. Has concerns for decreased libido and decreased muscle mass with concern for low testosterone and would like to have this evaluated. Denies fevers, chills, night sweats, headaches, changes in vision, neck pain/stiffness, nausea, diarrhea, vomiting, lesions or rashes.  Douglas Conway denies feelings of being down, depressed, or hopeless recently.  Continues to drink alcohol socially and use methamphetamine with every day tobacco use.  Condoms offered.  Healthcare maintenance due includes tetanus booster.  Allergies  Allergen Reactions   Plavix [Clopidogrel Bisulfate] Rash      Outpatient Medications Prior to Visit  Medication Sig Dispense Refill   amiodarone (PACERONE) 200 MG tablet Take 1 tablet (200 mg total) by mouth daily. 30 tablet 0   aspirin 81 MG EC tablet Take 1 tablet (81 mg total) by mouth daily. Swallow whole. 90 tablet 3   atorvastatin (LIPITOR) 20 MG tablet Take 1 tablet (20 mg total) by mouth daily. 90 tablet 3   cabotegravir & rilpivirine ER  (CABENUVA) 600 & 900 MG/3ML injection Inject 1 kit into the muscle every 2 (two) months. 6 mL 5   Cholecalciferol (VITAMIN D-3) 125 MCG (5000 UT) TABS Take 5,000 Units by mouth daily.     levothyroxine (SYNTHROID) 175 MCG tablet TAKE 1 TABLET BY MOUTH ONCE DAILY BEFORE  BREAKFAST 30 tablet 0   metoprolol succinate (TOPROL XL) 25 MG 24 hr tablet Take 0.5 tablets (12.5 mg total) by mouth daily. 30 tablet 5   sacubitril-valsartan (ENTRESTO) 49-51 MG Take 1 tablet by mouth 2 (two) times daily. 60 tablet 11   sildenafil (VIAGRA) 50 MG tablet Take 1 tablet (50 mg total) by mouth daily as needed for erectile dysfunction. 30 tablet 3   budesonide-formoterol (SYMBICORT) 80-4.5 MCG/ACT inhaler Inhale 2 puffs into the lungs 2 (two) times daily. (Patient not taking: Reported on 08/24/2021) 1 each 5   magic mouthwash w/lidocaine SOLN Take 5 mLs by mouth 4 (four) times daily as needed for mouth pain. (Patient not taking: Reported on 08/24/2021) 240 mL 0   spironolactone (ALDACTONE) 25 MG tablet Take 0.5 tablets (12.5 mg total) by mouth daily. 45 tablet 3   No facility-administered medications prior to visit.     Past Medical History:  Diagnosis Date   Change in hearing, bilateral 06/28/2018   Chronic systolic congestive heart failure (HCC)    a. 06/2019 Echo: EF 30-35%, glob HK. gr1 DD. Mod elev RVSP. Midly dil LA, Triv MR/AI. Asc Ao 53mm; b. 09/2020 Echo: EF<20%, glob HK. Sev  red RV fxn. RVSP 48.49mmHg. Mod dil LA. Sev dil RA. Mild MR.   Coronary artery disease    a. 03/2005 Ant STEMI s/p PCI/DES to the LAD; b. 05/2010 St echo: reportedly nl; c. 09/2020 Cath: LM nl, LAD 100p/m, D1 min irregs, LCX min irregs, RCA 20p. Dist LAD fills via collats from RPDA-->Med Rx.   Drug abuse ([place])    a. cocaine/methamphetamines.   HIV disease ([place]) 06/28/2018   Hyperlipidemia LDL goal <70    Hypertension 06/28/2018   Hypothyroidism 01/07/2019   Ischemic cardiomyopathy    a.  06/2019 Echo: EF 30-35%; 09/2020 Echo: EF<20%, glob  HK.   Lateral epicondylitis 01/07/2019   Noncompliance    STEMI (ST elevation myocardial infarction) ([place])    Tobacco abuse    Wide-complex tachycardia    a. 10/2020 - SVT w/ aberrancy vs VT-->s/p DCCV.     Past Surgical History:  Procedure Laterality Date   CORONARY ANGIOPLASTY WITH STENT PLACEMENT     LEFT HEART CATH AND CORONARY ANGIOGRAPHY N/A 10/08/2020   Procedure: LEFT HEART CATH AND CORONARY ANGIOGRAPHY;  Surgeon: Wellington Hampshire, MD;  Location: [facility];  Service: Cardiovascular;  Laterality: N/A;      Review of Systems  Constitutional:  Negative for appetite change, chills, fatigue, fever and unexpected weight change.  Eyes:  Negative for visual disturbance.  Respiratory:  Negative for cough, chest tightness, shortness of breath and wheezing.   Cardiovascular:  Negative for chest pain and leg swelling.  Gastrointestinal:  Negative for abdominal pain, constipation, diarrhea, nausea and vomiting.  Genitourinary:  Negative for dysuria, flank pain, frequency, genital sores, hematuria and urgency.  Skin:  Negative for rash.  Allergic/Immunologic: Negative for immunocompromised state.  Neurological:  Negative for dizziness and headaches.      Objective:    BP (!) 154/78   Pulse 70   Temp 97.9 F (36.6 C) (Oral)   Wt 190 lb (86.2 kg)   BMI 25.07 kg/m  Nursing note and vital signs reviewed.  Physical Exam Constitutional:      General: He is not in acute distress.    Appearance: He is well-developed.  Eyes:     Conjunctiva/sclera: Conjunctivae normal.  Cardiovascular:     Rate and Rhythm: Normal rate and regular rhythm.     Heart sounds: Normal heart sounds. No murmur heard.    No friction rub. No gallop.  Pulmonary:     Effort: Pulmonary effort is normal. No respiratory distress.     Breath sounds: Normal breath sounds. No wheezing or rales.  Chest:     Chest wall: No tenderness.  Abdominal:     General: Bowel sounds are normal.     Palpations:  Abdomen is soft.     Tenderness: There is no abdominal tenderness.  Musculoskeletal:     Cervical back: Neck supple.  Lymphadenopathy:     Cervical: No cervical adenopathy.  Skin:    General: Skin is warm and dry.     Findings: No rash.  Neurological:     Mental Status: He is alert and oriented to person, place, and time.  Psychiatric:        Behavior: Behavior normal.        Thought Content: Thought content normal.        Judgment: Judgment normal.         09/20/2021    8:59 AM 05/31/2021    1:44 PM 03/18/2021    1:48 PM 01/29/2021  2:04 PM 12/30/2020   11:13 AM  Depression screen PHQ 2/9  Decreased Interest 0 0 0 0 0  Down, Depressed, Hopeless 0 0 0 0 0  PHQ - 2 Score 0 0 0 0 0  Altered sleeping  0  0   Tired, decreased energy  0  0   Change in appetite  0  0   Feeling bad or failure about yourself   0  0   Trouble concentrating  0  0   Moving slowly or fidgety/restless  0  0   Suicidal thoughts  0  0   PHQ-9 Score  0  0   Difficult doing work/chores  Not difficult at all  Not difficult at all        Assessment & Plan:    Patient Active Problem List   Diagnosis Date Noted   Decreased libido 09/20/2021   Centrilobular emphysema ([place]) 01/29/2021   Overweight with body mass index (BMI) of 25 to 25.9 in adult 01/29/2021   Coronary artery disease    Polysubstance abuse ([place])    Erectile dysfunction 11/16/2020   NSTEMI (non-ST elevated myocardial infarction) ([place])    Hyperlipidemia    Unstable angina (HCC)    Chronic systolic CHF (congestive heart failure) ([place]) 05/14/2019   Hypothyroidism 01/07/2019   HIV infection ([place]) 06/28/2018   Hypertension 06/28/2018   Healthcare maintenance 06/28/2018     Problem List Items Addressed This Visit       Other   HIV infection [facility])    Douglas Conway continues to have adequately controlled virus with good adherence and tolerance to [place].  Reviewed previous lab work and discussed plan of care.  Check blood work today.  Cabenuva administered with no complications.  Plan for follow-up in 2 months or sooner if needed.      Relevant Orders   Comprehensive metabolic panel   HIV-1 RNA quant-no reflex-bld   T-helper cell (CD4)- ([facility] clinic only)   Healthcare maintenance    Discussed importance of safe sexual practices and condom use.  Condoms offered. Declines tetanus vaccine. Routine dental care up-to-date.      Polysubstance abuse ([place])    Continues to use methamphetamine.  Working on decreasing usage.  Discussed importance of cessation to reduce risk of further progression of diseases.  He is in the precontemplation stage.      Decreased libido - Primary    Douglas Conway has decreased libido and concern for decreased muscle mass and would like his testosterone checked.  He is on a low-dose of spironolactone which should not drastically affect his testosterone levels but may be contributing.  Check testosterone.  If low may need to consider secondary testosterone check and additional work up.       Relevant Orders   Testosterone   Other Visit Diagnoses     Screening for STDs (sexually transmitted diseases)       Relevant Orders   RPR        I am having [name] maintain his Vitamin D-3, Cabenuva, aspirin EC, atorvastatin, spironolactone, metoprolol succinate, budesonide-formoterol, magic mouthwash w/lidocaine, amiodarone, sildenafil, Entresto, and levothyroxine. We administered cabotegravir & rilpivirine ER.   Meds ordered this encounter  Medications   cabotegravir & rilpivirine ER (CABENUVA) 600 & 900 MG/3ML injection 1 kit     Follow-up: Return in about 2 months (around 11/20/2021), or if symptoms worsen or fail to improve.   Terri Piedra, MSN, FNP-C Nurse Practitioner [facility] for Infectious Disease [facility]  [place] number: [phone]

## 2021-09-22 LAB — T-HELPER CELL (CD4) - (RCID CLINIC ONLY)
CD4 % Helper T Cell: 38 % (ref 33–65)
CD4 T Cell Abs: 851 /uL (ref 400–1790)

## 2021-09-23 LAB — COMPREHENSIVE METABOLIC PANEL
AG Ratio: 1.5 (calc) (ref 1.0–2.5)
ALT: 15 U/L (ref 9–46)
AST: 25 U/L (ref 10–35)
Albumin: 4.6 g/dL (ref 3.6–5.1)
Alkaline phosphatase (APISO): 60 U/L (ref 35–144)
BUN/Creatinine Ratio: 26 (calc) — ABNORMAL HIGH (ref 6–22)
BUN: 30 mg/dL — ABNORMAL HIGH (ref 7–25)
CO2: 28 mmol/L (ref 20–32)
Calcium: 10 mg/dL (ref 8.6–10.3)
Chloride: 103 mmol/L (ref 98–110)
Creat: 1.17 mg/dL (ref 0.70–1.35)
Globulin: 3 g/dL (calc) (ref 1.9–3.7)
Glucose, Bld: 87 mg/dL (ref 65–99)
Potassium: 4.1 mmol/L (ref 3.5–5.3)
Sodium: 139 mmol/L (ref 135–146)
Total Bilirubin: 0.6 mg/dL (ref 0.2–1.2)
Total Protein: 7.6 g/dL (ref 6.1–8.1)

## 2021-09-23 LAB — HIV-1 RNA QUANT-NO REFLEX-BLD
HIV 1 RNA Quant: <20 Copies/mL — ABNORMAL HIGH
HIV-1 RNA Quant, Log: <1.3 Log cps/mL — ABNORMAL HIGH

## 2021-09-23 LAB — RPR: RPR Ser Ql: NONREACTIVE

## 2021-09-23 LAB — TESTOSTERONE: Testosterone: 590 ng/dL (ref 250–827)

## 2021-09-24 ENCOUNTER — Telehealth: Payer: Self-pay

## 2021-09-24 NOTE — Telephone Encounter (Signed)
-----   Message from Veryl Speak, FNP sent at 09/24/2021  2:01 PM EDT ----- Please inform Douglas Conway that his viral load is undetectable and CD4 count is 851. His kidney function, liver function, and electrolytes are normal. His testosterone is in the normal range so no additional treatment is recommended for this.

## 2021-09-24 NOTE — Telephone Encounter (Signed)
Spoke with patient, relayed viral load is undetectable and CD4 count is excellent at 851. Notified him that kidney and liver function as well as electrolytes are all normal. Advised that testosterone is within normal range and additional treatment not recommended. Patient verbalized understanding and has no further questions.   Sandie Ano, RN

## 2021-10-07 ENCOUNTER — Other Ambulatory Visit: Payer: Self-pay | Admitting: Internal Medicine

## 2021-10-07 ENCOUNTER — Other Ambulatory Visit: Payer: Self-pay | Admitting: Cardiology

## 2021-10-07 DIAGNOSIS — E039 Hypothyroidism, unspecified: Secondary | ICD-10-CM

## 2021-10-07 NOTE — Telephone Encounter (Signed)
Requested medication (s) are due for refill today: na   Requested medication (s) are on the active medication list: no   Last refill:  euthyrox - discontinued 09/02/21  Future visit scheduled: no  Notes to clinic:  please clarify is medication needs to be ordered. Not on medication list.      Requested Prescriptions  Pending Prescriptions Disp Refills   EUTHYROX 175 MCG tablet [Pharmacy Med Name: Euthyrox 175 MCG Oral Tablet] 30 tablet 0    Sig: TAKE 1 TABLET BY MOUTH ONCE DAILY BEFORE BREAKFAST     Endocrinology:  Hypothyroid Agents Failed - 10/07/2021  1:09 PM      Failed - TSH in normal range and within 360 days    TSH  Date Value Ref Range Status  01/05/2021 66.000 (H) 0.350 - 4.500 uIU/mL Final    Comment:    Performed by a 3rd Generation assay with a functional sensitivity of <=0.01 uIU/mL. Performed at [facility], [address] [place], [place] [address]   06/12/2019 7.300 (H) 0.450 - 4.500 uIU/mL Final         Passed - Valid encounter within last 12 months    Recent Outpatient Visits           4 months ago Sore throat   [facility] [place], Salvadore Oxford, NP   8 months ago Need for immunization against influenza   [facility] [place], Salvadore Oxford, NP       Future Appointments             In 1 month Brion Aliment, Dois Davenport, NP [facility]

## 2021-10-12 ENCOUNTER — Other Ambulatory Visit: Payer: Self-pay | Admitting: Family

## 2021-10-12 DIAGNOSIS — E039 Hypothyroidism, unspecified: Secondary | ICD-10-CM

## 2021-10-20 ENCOUNTER — Telehealth: Payer: Self-pay

## 2021-10-20 DIAGNOSIS — E039 Hypothyroidism, unspecified: Secondary | ICD-10-CM

## 2021-10-20 NOTE — Addendum Note (Signed)
Addended by: Jeanine Luz D on: 10/20/2021 01:24 PM   Modules accepted: Orders

## 2021-10-20 NOTE — Telephone Encounter (Signed)
Patient is asking if you could refill his levothyroxine and he has been out of it for a week. Patient stating he does not have a pcp at this time. Rx can be sent to [facility] in [place].  Douglas Conway

## 2021-10-20 NOTE — Telephone Encounter (Signed)
Ok to fill 30 day supply but he will need to come in for blood work as the last thyroid lab work available is from last year. I will place labs for him to complete.

## 2021-10-21 ENCOUNTER — Other Ambulatory Visit: Payer: Self-pay

## 2021-10-21 DIAGNOSIS — E039 Hypothyroidism, unspecified: Secondary | ICD-10-CM

## 2021-10-21 MED ORDER — LEVOTHYROXINE SODIUM 175 MCG PO TABS: 175.0000 ug | ORAL_TABLET | Freq: Every day | ORAL | 0 refills | Status: DC

## 2021-10-21 NOTE — Telephone Encounter (Signed)
Refill sent for the patient. Patient would like to get labs done at his next [facility] appointment 11/16/21

## 2021-11-04 ENCOUNTER — Other Ambulatory Visit (HOSPITAL_COMMUNITY): Payer: Self-pay

## 2021-11-08 ENCOUNTER — Other Ambulatory Visit: Payer: Self-pay | Admitting: Pharmacist

## 2021-11-08 ENCOUNTER — Other Ambulatory Visit (HOSPITAL_COMMUNITY): Payer: Self-pay

## 2021-11-08 DIAGNOSIS — B20 Human immunodeficiency virus [HIV] disease: Secondary | ICD-10-CM

## 2021-11-08 DIAGNOSIS — J432 Centrilobular emphysema: Secondary | ICD-10-CM

## 2021-11-08 MED ORDER — CABENUVA 600 & 900 MG/3ML IM SUER
1.0000 | INTRAMUSCULAR | 5 refills | Status: AC
  Filled 2021-11-08 (×2): qty 6, 60d supply, fill #0
  Filled 2022-01-06: qty 6, 60d supply, fill #1

## 2021-11-09 ENCOUNTER — Other Ambulatory Visit (HOSPITAL_COMMUNITY): Payer: Self-pay

## 2021-11-11 ENCOUNTER — Telehealth: Payer: Self-pay

## 2021-11-11 NOTE — Telephone Encounter (Signed)
[facility] Patient Advocate Encounter  Patient's medication (Cabenuva) have been couriered to [facility] from [facility] and will be administered on the patient next office visit on 11/16/21.  Jessy Cybulski , CPhT [facility]ty [facility] Phone: [phone] Fax:  [phone]  

## 2021-11-13 ENCOUNTER — Other Ambulatory Visit: Payer: Self-pay | Admitting: Internal Medicine

## 2021-11-13 ENCOUNTER — Other Ambulatory Visit: Payer: Self-pay | Admitting: Cardiology

## 2021-11-13 DIAGNOSIS — E039 Hypothyroidism, unspecified: Secondary | ICD-10-CM

## 2021-11-14 ENCOUNTER — Other Ambulatory Visit: Payer: Self-pay

## 2021-11-14 ENCOUNTER — Emergency Department
Admission: EM | Admit: 2021-11-14 | Discharge: 2021-11-14 | Discharge status: Against Medical Advice | Payer: Medicaid Other | Attending: Emergency Medicine | Admitting: Emergency Medicine

## 2021-11-14 ENCOUNTER — Emergency Department: Payer: Medicaid Other

## 2021-11-14 DIAGNOSIS — Z5321 Procedure and treatment not carried out due to patient leaving prior to being seen by health care provider: Secondary | ICD-10-CM | POA: Diagnosis not present

## 2021-11-14 DIAGNOSIS — R071 Chest pain on breathing: Secondary | ICD-10-CM | POA: Insufficient documentation

## 2021-11-14 DIAGNOSIS — R0781 Pleurodynia: Secondary | ICD-10-CM | POA: Insufficient documentation

## 2021-11-14 NOTE — ED Triage Notes (Signed)
Pt presents to ER c/o right side rib pain that started 2 days ago.  Pt states he was leaning over engine bay of his truck, and tried to jump to get better view, and came down on right side of his rib.  Pt states he began feeling pain afterwards.  Pt c/o pain with inspiration.  Pt is A&O x4 at this time in NAD in triage.

## 2021-11-14 NOTE — ED Notes (Signed)
Pt called for treatment room x3, no answer, pt not visualized in the lobby.

## 2021-11-14 NOTE — ED Notes (Signed)
Called for room x1, no answer. 

## 2021-11-14 NOTE — ED Notes (Signed)
Pt called for treatment room x 2, no answer. 

## 2021-11-16 ENCOUNTER — Other Ambulatory Visit: Payer: Self-pay

## 2021-11-16 ENCOUNTER — Ambulatory Visit (INDEPENDENT_AMBULATORY_CARE_PROVIDER_SITE_OTHER): Payer: Medicaid Other | Admitting: Pharmacist

## 2021-11-16 DIAGNOSIS — B20 Human immunodeficiency virus [HIV] disease: Secondary | ICD-10-CM

## 2021-11-16 DIAGNOSIS — E039 Hypothyroidism, unspecified: Secondary | ICD-10-CM

## 2021-11-16 MED ORDER — LEVOTHYROXINE SODIUM 175 MCG PO TABS: 175.0000 ug | ORAL_TABLET | Freq: Every day | ORAL | 0 refills | Status: DC

## 2021-11-16 MED ORDER — CABOTEGRAVIR & RILPIVIRINE ER 600 & 900 MG/3ML IM SUER
1.0000 | Freq: Once | INTRAMUSCULAR | Status: AC
  Administered 2021-11-16: 1 via INTRAMUSCULAR

## 2021-11-16 NOTE — Patient Instructions (Signed)
[phone] 

## 2021-11-16 NOTE — Progress Notes (Signed)
11/16/2021  HPI: Douglas Conway is a 66 y.o. male who presents to the [facility] for HIV follow-up.  Patient Active Problem List   Diagnosis Date Noted   Decreased libido 09/20/2021   Centrilobular emphysema ([place]) 01/29/2021   Overweight with body mass index (BMI) of 25 to 25.9 in adult 01/29/2021   Coronary artery disease    Polysubstance abuse ([place])    Erectile dysfunction 11/16/2020   NSTEMI (non-ST elevated myocardial infarction) [facility])    Hyperlipidemia    Unstable angina (HCC)    Chronic systolic CHF (congestive heart failure) ([place]) 05/14/2019   Hypothyroidism 01/07/2019   HIV infection ([place]) 06/28/2018   Hypertension 06/28/2018   Healthcare maintenance 06/28/2018    Patient's Medications  New Prescriptions   No medications on file  Previous Medications   AMIODARONE (PACERONE) 200 MG TABLET    Take 1 tablet (200 mg total) by mouth daily.   ASPIRIN 81 MG EC TABLET    Take 1 tablet (81 mg total) by mouth daily. Swallow whole.   ATORVASTATIN (LIPITOR) 20 MG TABLET    Take 1 tablet (20 mg total) by mouth daily.   BUDESONIDE-FORMOTEROL (SYMBICORT) 80-4.5 MCG/ACT INHALER    Inhale 2 puffs into the lungs 2 (two) times daily.   CABOTEGRAVIR & RILPIVIRINE ER (CABENUVA) 600 & 900 MG/3ML INJECTION    Inject 1 kit into the muscle every 2 (two) months.   CHOLECALCIFEROL (VITAMIN D-3) 125 MCG (5000 UT) TABS    Take 5,000 Units by mouth daily.   LEVOTHYROXINE (SYNTHROID) 175 MCG TABLET    Take 1 tablet (175 mcg total) by mouth daily before breakfast.   MAGIC MOUTHWASH W/LIDOCAINE SOLN    Take 5 mLs by mouth 4 (four) times daily as needed for mouth pain.   METOPROLOL SUCCINATE (TOPROL-XL) 25 MG 24 HR TABLET    Take 1/2 (one-half) tablet by mouth once daily   SACUBITRIL-VALSARTAN (ENTRESTO) 49-51 MG    Take 1 tablet by mouth 2 (two) times daily.   SILDENAFIL (VIAGRA) 50 MG TABLET    Take 1 tablet (50 mg total) by mouth daily as needed for erectile dysfunction.   SPIRONOLACTONE  (ALDACTONE) 25 MG TABLET    Take 0.5 tablets (12.5 mg total) by mouth daily.  Modified Medications   No medications on file  Discontinued Medications   No medications on file    Allergies: Allergies  Allergen Reactions   Plavix [Clopidogrel Bisulfate] Rash    Past Medical History: Past Medical History:  Diagnosis Date   Change in hearing, bilateral 14/43/1540   Chronic systolic congestive heart failure ([place])    a. 06/2019 Echo: EF 30-35%, glob HK. gr1 DD. Mod elev RVSP. Midly dil LA, Triv MR/AI. Asc Ao 98m; b. 09/2020 Echo: EF<20%, glob HK. Sev red RV fxn. RVSP 48.432mg. Mod dil LA. Sev dil RA. Mild MR.   Coronary artery disease    a. 03/2005 Ant STEMI s/p PCI/DES to the LAD; b. 05/2010 St echo: reportedly nl; c. 09/2020 Cath: LM nl, LAD 100p/m, D1 min irregs, LCX min irregs, RCA 20p. Dist LAD fills via collats from RPDA-->Med Rx.   Drug abuse ([facility]   a. cocaine/methamphetamines.   HIV disease ([facility]03/19/2020   Hyperlipidemia LDL goal <70    Hypertension 06/28/2018   Hypothyroidism 01/07/2019   Ischemic cardiomyopathy    a.  06/2019 Echo: EF 30-35%; 09/2020 Echo: EF<20%, glob HK.   Lateral epicondylitis 01/07/2019   Noncompliance    STEMI (ST elevation  myocardial infarction) ([place])    Tobacco abuse    Wide-complex tachycardia    a. 10/2020 - SVT w/ aberrancy vs VT-->s/p DCCV.    Social History: Social History   Socioeconomic History   Marital status: Divorced    Spouse name: Not on file   Number of children: Not on file   Years of education: Not on file   Highest education level: Not on file  Occupational History   Not on file  Tobacco Use   Smoking status: Every Day    Packs/day: 0.50    Years: 42.00    Total pack years: 21.00    Types: Cigarettes    Start date: 01/07/1968   Smokeless tobacco: Never   Tobacco comments:    Smoked 1 ppd for most of his adult life but now smoking 2 cigarettes daily  Vaping Use   Vaping Use: Never used  Substance and Sexual Activity    Alcohol use: Not Currently    Alcohol/week: 1.0 standard drink of alcohol    Types: 1 Cans of beer per week    Comment: 1 can/beer/daily   Drug use: Yes    Types: Methamphetamines    Comment: smokes or snorts crystal meth 2-3x/wk.   Sexual activity: Not Currently    Partners: Female    Birth control/protection: Condom    Comment: given condoms  Other Topics Concern   Not on file  Social History Narrative   Lives in [place] by himself.  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   Social Determinants of Health   Financial Resource Strain: Not on file  Food Insecurity: Not on file  Transportation Needs: Not on file  Physical Activity: Not on file  Stress: Not on file  Social Connections: Not on file    Labs: Lab Results  Component Value Date   HIV1RNAQUANT <20 (H) 09/20/2021   HIV1RNAQUANT <20 (H) 05/24/2021   HIV1RNAQUANT Not Detected 03/18/2021   CD4TABS 851 09/20/2021   CD4TABS 683 03/18/2021   CD4TABS 668 11/16/2020    RPR and STI Lab Results  Component Value Date   LABRPR NON-REACTIVE 09/20/2021   LABRPR NON-REACTIVE 03/18/2021   LABRPR NON-REACTIVE 11/16/2020   LABRPR NON-REACTIVE 03/18/2020   LABRPR NON-REACTIVE 01/07/2019    STI Results GC CT  06/13/2018 12:00 AM Negative  Negative     Hepatitis B Lab Results  Component Value Date   HEPBSAB REACTIVE (A) 06/13/2018   HEPBSAG NON-REACTIVE 06/13/2018   HEPBCAB NON-REACTIVE 06/13/2018   Hepatitis C Lab Results  Component Value Date   HEPCAB REACTIVE (A) 06/13/2018   HCVRNAPCRQN <15 NOT DETECTED 06/13/2018   Hepatitis A Lab Results  Component Value Date   HAV REACTIVE (A) 06/13/2018   Lipids: Lab Results  Component Value Date   CHOL 213 (H) 03/18/2021   TRIG 202 (H) 03/18/2021   HDL 53 03/18/2021   CHOLHDL 4.0 03/18/2021   LDLCALC 126 (H) 03/18/2021    Current HIV Regimen: Cabenuva IM Q2 month- patient supplied   Assessment: 30 YOM presenting for Q2Month follow-up for Cabenuva  injections. Patient reports he has been tolerating injections well and pain has decreased with subsequent injections. He states he has had some pain recently with possible rib fracture, but otherwise is doing well. Patient needed to be setup with a PCP and a number was provided for him to call during the visit. Patient did not need STI testing during this visit. Patient also needed his TSH checked for hypothyroidism during  this visit and he had a refill sent for his levothyroxine as a 30 DS.   Plan: - Check HIV RNA, TSH - F/U in 2 months (01/17/2022) with Terri Piedra, NP    Adria Dill, PharmD PGY-2 Infectious Diseases Resident  11/16/2021 10:05 AM

## 2021-11-17 ENCOUNTER — Telehealth: Payer: Self-pay | Admitting: Family

## 2021-11-17 MED ORDER — LEVOTHYROXINE SODIUM 200 MCG PO TABS
200.0000 ug | ORAL_TABLET | Freq: Every day | ORAL | 2 refills | Status: AC
Start: 2021-11-17 — End: ?

## 2021-11-17 NOTE — Addendum Note (Signed)
Addended by: Jeanine Luz D on: 11/17/2021 01:25 PM   Modules accepted: Orders

## 2021-11-17 NOTE — Telephone Encounter (Signed)
Please inform Douglas Conway that his TSH was 7 which is slightly higher than we want it to be. I have sent in an increased dose of  levothyroxine 200 mcg. He will need to retest at his next injection or in about 6 weeks.

## 2021-11-17 NOTE — Progress Notes (Signed)
FYI. I sent in another 30 days of his current dose but told him you may change it based on his numbers.

## 2021-11-17 NOTE — Telephone Encounter (Signed)
Called patient to discuss results, no answer and voicemail box full.  Sandie Ano, RN

## 2021-11-18 LAB — T4, FREE: Free T4: 1.4 ng/dL (ref 0.8–1.8)

## 2021-11-18 LAB — HIV-1 RNA QUANT-NO REFLEX-BLD
HIV 1 RNA Quant: NOT DETECTED Copies/mL
HIV-1 RNA Quant, Log: NOT DETECTED Log cps/mL

## 2021-11-18 LAB — TSH+FREE T4: TSH W/REFLEX TO FT4: 7.12 mIU/L — ABNORMAL HIGH (ref 0.40–4.50)

## 2021-11-19 NOTE — Telephone Encounter (Signed)
Second attempt to reach patient, no answer and voicemail box full.   Cristiano Capri D Jasmeen Fritsch, RN  

## 2021-11-22 NOTE — Telephone Encounter (Signed)
Third attempt to reach patient, no answer and unable to leave voicemail.   Sandie Ano, RN

## 2021-12-03 ENCOUNTER — Ambulatory Visit: Payer: Medicaid Other | Admitting: Nurse Practitioner

## 2021-12-03 NOTE — Progress Notes (Deleted)
Office Visit    Patient Name: Douglas Conway Date of Encounter: 12/03/2021  Primary Care Provider:  Pcp, No Primary Cardiologist:  Ida Rogue, MD  Chief Complaint    66 year old male with history of CAD, ischemic cardiomyopathy, HFrEF, HIV, hypothyroidism, hypertension, hyperlipidemia, tobacco abuse, and medication nonadherence, who presents for follow-up of CAD and HFrEF.  Past Medical History    Past Medical History:  Diagnosis Date   Change in hearing, bilateral 14/78/2956   Chronic systolic congestive heart failure ([place])    a. 06/2019 Echo: EF 30-35%, glob HK. gr1 DD. Mod elev RVSP. Midly dil LA, Triv MR/AI. Asc Ao 82m; b. 09/2020 Echo: EF<20%, glob HK. Sev red RV fxn. RVSP 48.415mg. Mod dil LA. Sev dil RA. Mild MR.   Coronary artery disease    a. 03/2005 Ant STEMI s/p PCI/DES to the LAD; b. 05/2010 St echo: reportedly nl; c. 09/2020 Cath: LM nl, LAD 100p/m, D1 min irregs, LCX min irregs, RCA 20p. Dist LAD fills via collats from RPDA-->Med Rx.   Drug abuse ([facility]   a. cocaine/methamphetamines.   HIV disease ([facility]03/19/2020   Hyperlipidemia LDL goal <70    Hypertension 06/28/2018   Hypothyroidism 01/07/2019   Ischemic cardiomyopathy    a.  06/2019 Echo: EF 30-35%; 09/2020 Echo: EF<20%, glob HK.   Lateral epicondylitis 01/07/2019   Noncompliance    STEMI (ST elevation myocardial infarction) ([facility]   Tobacco abuse    Wide-complex tachycardia    a. 10/2020 - SVT w/ aberrancy vs VT-->s/p DCCV.   Past Surgical History:  Procedure Laterality Date   CORONARY ANGIOPLASTY WITH STENT PLACEMENT     LEFT HEART CATH AND CORONARY ANGIOGRAPHY N/A 10/08/2020   Procedure: LEFT HEART CATH AND CORONARY ANGIOGRAPHY;  Surgeon: ArWellington HampshireMD;  [facility];  Service: Cardiovascular;  Laterality: N/A;    Allergies  Allergies  Allergen Reactions   Plavix [Clopidogrel Bisulfate] Rash    History of Present Illness    [age]ear old male with above complex past  medical history including CAD, ischemic cardiomyopathy, HFrEF, hypertension, hyperlipidemia, HIV, hypothyroidism, tobacco abuse, and noncompliance.  Cardiac history dates back to 2006, when he suffered an anterior STEMI and underwent PCI and drug-eluting stent placement of the LAD.  He was found to have LV dysfunction at that time with an EF of 25%.  EF remained depressed and patient was advised that he needed an ICD, but he declined.  He was subsequently lost to follow-up but [facility] in [facility].  Echo at that time showed an EF of 30 to 35% with global hypokinesis.  He was placed on GDMT but did not initially feel prescriptions.  In June 2022, he was hospitalized with wide-complex tachycardia at 180 290 bpm requiring cardioversion in the field.  He ruled in for non-STEMI.  Echo showed an EF of 20% with global hypokinesis.  Diagnostic catheterization showed total occlusion of the proximal/mid LAD at the site of prior stent placement, with right to left collaterals.  He otherwise had minor irregularities/nonobstructive disease.  He was incidentally noted to have a TSH of 65.394 at that time.  He left AMA following catheterization but was readmitted October 13, 2020 with chest pain, diaphoresis, and near syncope in the setting of methamphetamine and cocaine use.  He again had wide-complex tachycardia, which was felt to be SVT with aberrancy versus ventricular tachycardia.  He again required cardioversion.  LifeVest was placed and he was placed  on amiodarone.  He was not felt to be an ICD candidate in the setting of ongoing drug use.  Mr. Debella was last seen in cardiology clinic in May 2023, at which time he reported feeling well, but continued to use methamphetamines.  Losartan therapy was transition to [facility] and he was maintained on beta-blocker and spironolactone.  Home Medications    Current Outpatient Medications  Medication Sig Dispense Refill   amiodarone (PACERONE) 200  MG tablet Take 1 tablet (200 mg total) by mouth daily. 30 tablet 0   aspirin 81 MG EC tablet Take 1 tablet (81 mg total) by mouth daily. Swallow whole. 90 tablet 3   atorvastatin (LIPITOR) 20 MG tablet Take 1 tablet (20 mg total) by mouth daily. 90 tablet 3   budesonide-formoterol (SYMBICORT) 80-4.5 MCG/ACT inhaler Inhale 2 puffs into the lungs 2 (two) times daily. (Patient not taking: Reported on 08/24/2021) 1 each 5   cabotegravir & rilpivirine ER (CABENUVA) 600 & 900 MG/3ML injection Inject 1 kit into the muscle every 2 (two) months. 6 mL 5   Cholecalciferol (VITAMIN D-3) 125 MCG (5000 UT) TABS Take 5,000 Units by mouth daily.     levothyroxine (SYNTHROID) 200 MCG tablet Take 1 tablet (200 mcg total) by mouth daily before breakfast. 30 tablet 2   magic mouthwash w/lidocaine SOLN Take 5 mLs by mouth 4 (four) times daily as needed for mouth pain. (Patient not taking: Reported on 08/24/2021) 240 mL 0   metoprolol succinate (TOPROL-XL) 25 MG 24 hr tablet Take 1/2 (one-half) tablet by mouth once daily 45 tablet 0   sacubitril-valsartan (ENTRESTO) 49-51 MG Take 1 tablet by mouth 2 (two) times daily. 60 tablet 11   sildenafil (VIAGRA) 50 MG tablet Take 1 tablet (50 mg total) by mouth daily as needed for erectile dysfunction. 30 tablet 3   spironolactone (ALDACTONE) 25 MG tablet Take 0.5 tablets (12.5 mg total) by mouth daily. 45 tablet 3   No current facility-administered medications for this visit.     Review of Systems    ***.  All other systems reviewed and are otherwise negative except as noted above.    Physical Exam    VS:  There were no vitals taken for this visit. , BMI There is no height or weight on file to calculate BMI.     GEN: Well nourished, well developed, in no acute distress. HEENT: normal. Neck: Supple, no JVD, carotid bruits, or masses. Cardiac: RRR, no murmurs, rubs, or gallops. No clubbing, cyanosis, edema.  Radials/DP/PT 2+ and equal bilaterally.  Respiratory:   Respirations regular and unlabored, clear to auscultation bilaterally. GI: Soft, nontender, nondistended, BS + x 4. MS: no deformity or atrophy. Skin: warm and dry, no rash. Neuro:  Strength and sensation are intact. Psych: Normal affect.  Accessory Clinical Findings    ECG personally reviewed by me today - *** - no acute changes.  Lab Results  Component Value Date   WBC 6.4 01/05/2021   HGB 14.2 01/05/2021   HCT 40.5 01/05/2021   MCV 92.0 01/05/2021   PLT 238 01/05/2021   Lab Results  Component Value Date   CREATININE 1.17 09/20/2021   BUN 30 (H) 09/20/2021   NA 139 09/20/2021   K 4.1 09/20/2021   CL 103 09/20/2021   CO2 28 09/20/2021   Lab Results  Component Value Date   ALT 15 09/20/2021   AST 25 09/20/2021   ALKPHOS 54 01/05/2021   BILITOT 0.6 09/20/2021   Lab Results  Component Value Date   CHOL 213 (H) 03/18/2021   HDL 53 03/18/2021   LDLCALC 126 (H) 03/18/2021   TRIG 202 (H) 03/18/2021   CHOLHDL 4.0 03/18/2021    No results found for: "HGBA1C"  Assessment & Plan    1.  ***   Murray Hodgkins, NP 12/03/2021, 7:33 AM

## 2022-01-03 ENCOUNTER — Other Ambulatory Visit: Payer: Self-pay | Admitting: Cardiovascular Disease

## 2022-01-03 ENCOUNTER — Other Ambulatory Visit: Payer: Self-pay | Admitting: Nurse Practitioner

## 2022-01-03 NOTE — Telephone Encounter (Signed)
Please contact pt for future appointment. Pt due for f/u. Pt needing refills.

## 2022-01-03 NOTE — Telephone Encounter (Signed)
Please contact pt for future appointment. Pt due for f/u. Pt needing refills. 

## 2022-01-05 NOTE — Telephone Encounter (Signed)
Rx refill sent to pharmacy. 

## 2022-01-06 ENCOUNTER — Other Ambulatory Visit (HOSPITAL_COMMUNITY): Payer: Self-pay

## 2022-01-07 ENCOUNTER — Other Ambulatory Visit (HOSPITAL_COMMUNITY): Payer: Self-pay

## 2022-01-10 ENCOUNTER — Telehealth: Payer: Self-pay

## 2022-01-10 NOTE — Telephone Encounter (Signed)
[facility] Patient Advocate Encounter  Patient's medication (Cabenuva) have been couriered to [facility] from [facility] and will be administered on the patient next office visit on 01/17/22.  Mariko Nowakowski , CPhT [facility]ty Pharmacy [facility] Phone: [phone] Fax:  [phone]  

## 2022-01-17 ENCOUNTER — Telehealth: Payer: Self-pay

## 2022-01-17 ENCOUNTER — Encounter: Payer: Medicaid Other | Admitting: Family

## 2022-01-17 NOTE — Telephone Encounter (Signed)
Thank you. He will need to come by Friday!

## 2022-01-17 NOTE — Telephone Encounter (Signed)
Thank you for the update. Reviewed chart and currently on dolutegravir-rilpivirine via tube. Will follow up post hospitalization.
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
## 2022-01-17 NOTE — Telephone Encounter (Signed)
Left VM regarding No Show 01/17/2022 Cabenuva [employer] date 01/16/22. Also alerted [facility]. Will send to Triage so we stay on top of getting patient back in for injection before 7 day window.

## 2022-03-16 ENCOUNTER — Encounter: Payer: Medicaid Other | Admitting: Pharmacist

## 2022-06-30 IMAGING — DX DG CHEST 1V PORT
1 series · 1 of 1 positions shown · non-contrast
Comparison: 10/07/2020

CLINICAL DATA: V-tach

EXAM:
PORTABLE CHEST 1 VIEW

[chest ap]
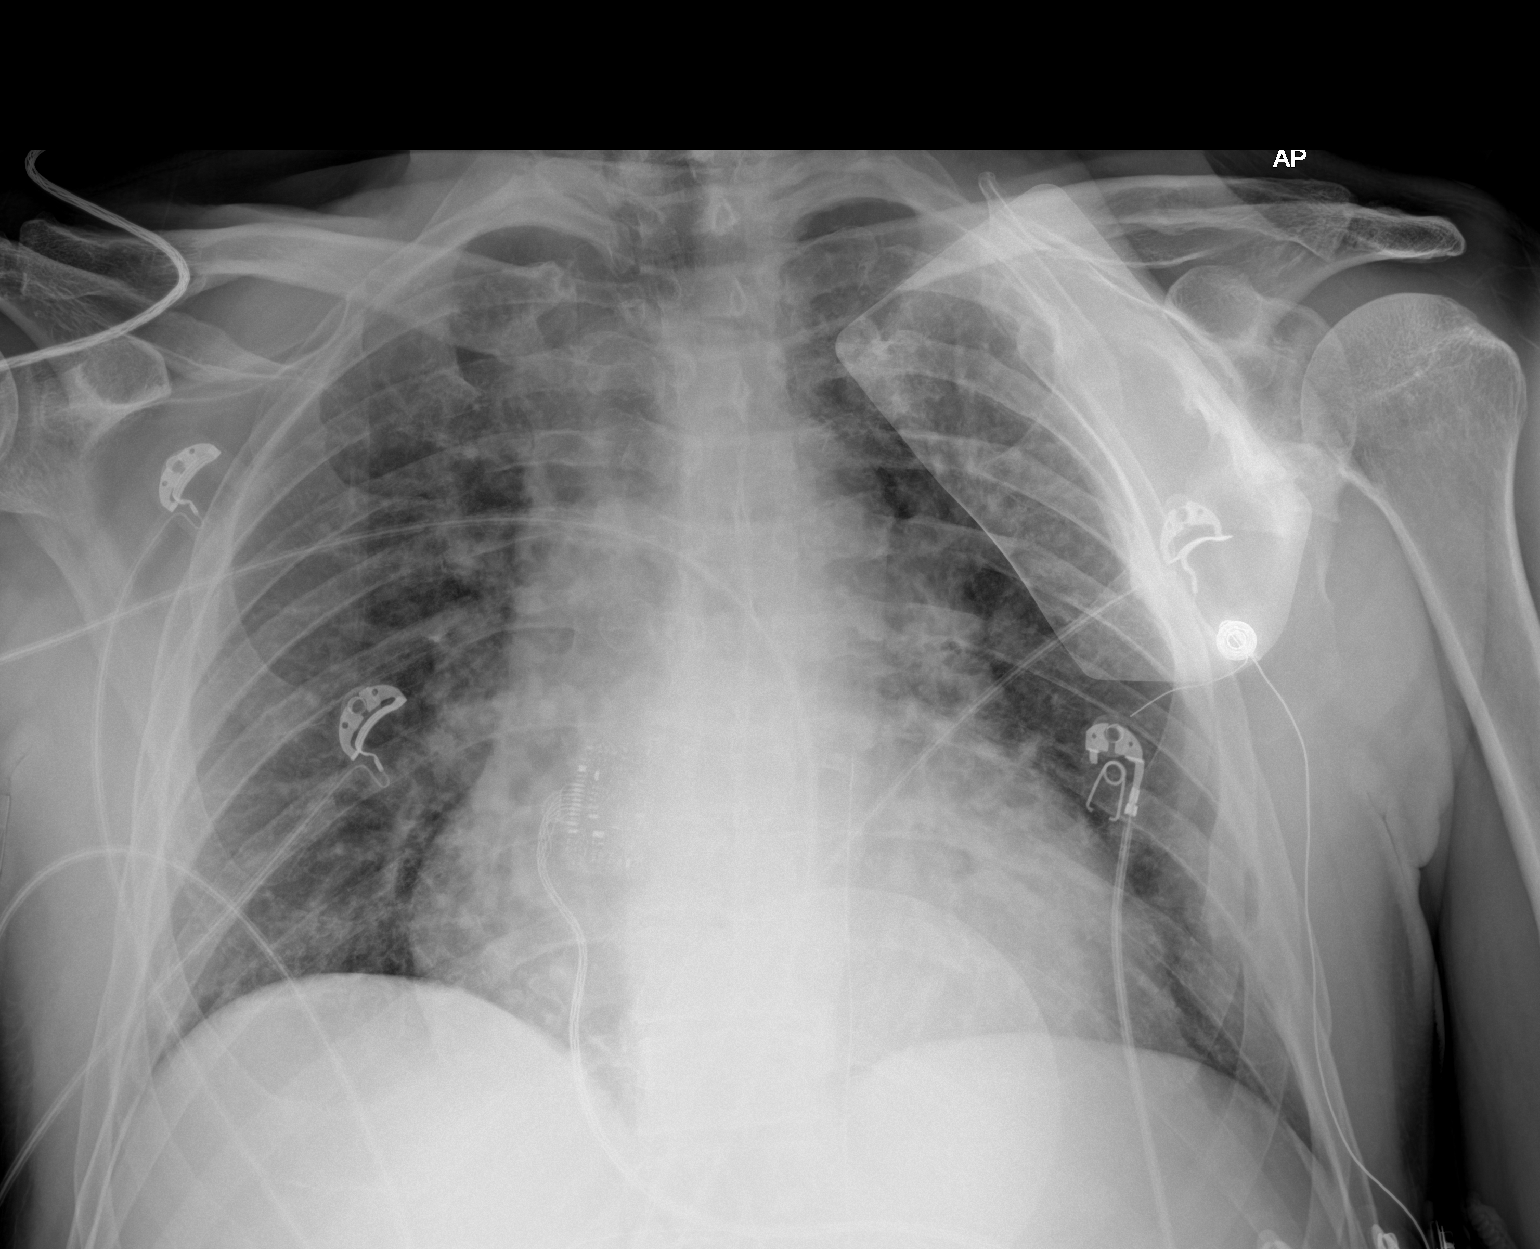

[1 of 1 positions shown; findings below may reference images not displayed]

FINDINGS: Increased interstitial markings. No frank interstitial edema. No
pleural effusion or pneumothorax.

Cardiomegaly.

Defibrillator pads overlying the left hemithorax.
IMPRESSION: Cardiomegaly.

No evidence of acute cardiopulmonary disease.
